# Patient Record
Sex: Male | Born: 1985 | Race: Black or African American | Hispanic: No | Marital: Single | Smoking: Former smoker
Health system: Southern US, Community
[De-identification: ages and names within clinical notes are randomized; demographics above are authoritative.]

## PROBLEM LIST (undated history)

## (undated) DIAGNOSIS — I471 Supraventricular tachycardia, unspecified: Secondary | ICD-10-CM

## (undated) DIAGNOSIS — N289 Disorder of kidney and ureter, unspecified: Secondary | ICD-10-CM

## (undated) DIAGNOSIS — G4733 Obstructive sleep apnea (adult) (pediatric): Secondary | ICD-10-CM

## (undated) DIAGNOSIS — Z720 Tobacco use: Secondary | ICD-10-CM

## (undated) DIAGNOSIS — I10 Essential (primary) hypertension: Secondary | ICD-10-CM

## (undated) DIAGNOSIS — B351 Tinea unguium: Secondary | ICD-10-CM

## (undated) DIAGNOSIS — E119 Type 2 diabetes mellitus without complications: Secondary | ICD-10-CM

## (undated) HISTORY — DX: Essential (primary) hypertension: I10

## (undated) HISTORY — DX: Morbid (severe) obesity due to excess calories: E66.01

## (undated) HISTORY — DX: Tobacco use: Z72.0

## (undated) HISTORY — DX: Obstructive sleep apnea (adult) (pediatric): G47.33

## (undated) HISTORY — DX: Supraventricular tachycardia, unspecified: I47.10

## (undated) HISTORY — DX: Type 2 diabetes mellitus without complications: E11.9

## (undated) HISTORY — DX: Tinea unguium: B35.1

## (undated) HISTORY — PX: APPENDECTOMY: SHX54

---

## 2019-05-20 ENCOUNTER — Ambulatory Visit: Payer: Medicaid Other | Admitting: Podiatry

## 2019-07-12 ENCOUNTER — Encounter: Payer: Self-pay | Admitting: Podiatry

## 2019-07-12 ENCOUNTER — Ambulatory Visit: Payer: Medicaid Other | Admitting: Podiatry

## 2019-07-12 ENCOUNTER — Other Ambulatory Visit: Payer: Self-pay

## 2019-07-12 DIAGNOSIS — E1159 Type 2 diabetes mellitus with other circulatory complications: Secondary | ICD-10-CM | POA: Diagnosis not present

## 2019-07-12 DIAGNOSIS — N182 Chronic kidney disease, stage 2 (mild): Secondary | ICD-10-CM | POA: Insufficient documentation

## 2019-07-12 DIAGNOSIS — E119 Type 2 diabetes mellitus without complications: Secondary | ICD-10-CM | POA: Insufficient documentation

## 2019-07-12 NOTE — Progress Notes (Signed)
This patient presents to the office with chief complaint of long thick nails and diabetic feet.  This patient  says there  is  no pain and discomfort in his  feet.  This patient says there are long thick painful nails. He presents to the office with caregiver. These nails are painful walking and wearing shoes.  Patient has no history of infection or drainage from both feet.  Patient is unable to  self treat his own nails . This patient presents  to the office today for treatment of the  long nails and a foot evaluation due to history of  diabetes.  General Appearance  Alert, conversant and in no acute stress.  Vascular  Dorsalis pedis and posterior tibial  pulses are not  palpable  bilaterally.  Capillary return is within normal limits  bilaterally. Temperature is within normal limits  Bilaterally.  Swelling ankles  B/L.  Neurologic  Senn-Weinstein monofilament wire test within normal limits  bilaterally. Muscle power within normal limits bilaterally.  Nails Thick disfigured discolored nails with subungual debris  from hallux to fifth toes bilaterally. No evidence of bacterial infection or drainage bilaterally.  Orthopedic  No limitations of motion of motion feet .  No crepitus or effusions noted.  No bony pathology or digital deformities noted.  Skin  normotropic skin with no porokeratosis noted bilaterally.  No signs of infections or ulcers noted.     Onychomycosis  Diabetes with vascular pathology.  IE  Debride nails x 10.  A diabetic foot exam was performed and there is no evidence of any vascular or neurologic pathology.   RTC 3 months.   Helane Gunther [name]

## 2019-11-09 ENCOUNTER — Encounter: Payer: Self-pay | Admitting: Podiatry

## 2019-11-09 ENCOUNTER — Ambulatory Visit: Payer: Medicaid Other | Admitting: Podiatry

## 2019-11-09 ENCOUNTER — Other Ambulatory Visit: Payer: Self-pay

## 2019-11-09 DIAGNOSIS — B351 Tinea unguium: Secondary | ICD-10-CM | POA: Diagnosis not present

## 2019-11-09 DIAGNOSIS — M79674 Pain in right toe(s): Secondary | ICD-10-CM

## 2019-11-09 DIAGNOSIS — E1159 Type 2 diabetes mellitus with other circulatory complications: Secondary | ICD-10-CM

## 2019-11-09 DIAGNOSIS — M79675 Pain in left toe(s): Secondary | ICD-10-CM | POA: Diagnosis not present

## 2019-11-09 NOTE — Progress Notes (Signed)
This patient returns to my office for at risk foot care.  This patient requires this care by a professional since this patient will be at risk due to having diabetes.   This patient is unable to cut nails himself since the patient cannot reach his nails.These nails are painful walking and wearing shoes.  This patient presents for at risk foot care today.  He is brought to the office by caregiver from  [place].  General Appearance  Alert, conversant and in no acute stress.  Vascular  Dorsalis pedis and posterior tibial  pulses are not  palpable  Bilaterally due to swelling..  Capillary return is within normal limits  bilaterally. Temperature is within normal limits  bilaterally.  Neurologic  Senn-Weinstein monofilament wire test within normal limits  bilaterally. Muscle power within normal limits bilaterally.  Nails Thick disfigured discolored nails with subungual debris  from hallux to fifth toes bilaterally. No evidence of bacterial infection or drainage bilaterally.  Orthopedic  No limitations of motion  feet .  No crepitus or effusions noted.  No bony pathology or digital deformities noted.  Skin  normotropic skin with no porokeratosis noted bilaterally.  No signs of infections or ulcers noted.     Onychomycosis  Pain in right toes  Pain in left toes  Consent was obtained for treatment procedures.   Mechanical debridement of nails 1-5  bilaterally performed with a nail nipper.  Filed with dremel without incident.    Return office visit   3 months                 Told patient to return for periodic foot care and evaluation due to potential at risk complications.   Roque Schill [name]  

## 2019-12-09 ENCOUNTER — Encounter: Payer: Self-pay | Admitting: *Deleted

## 2019-12-09 ENCOUNTER — Other Ambulatory Visit: Payer: Self-pay

## 2019-12-09 ENCOUNTER — Ambulatory Visit (INDEPENDENT_AMBULATORY_CARE_PROVIDER_SITE_OTHER): Payer: Medicaid Other | Admitting: Cardiology

## 2019-12-09 ENCOUNTER — Encounter: Payer: Self-pay | Admitting: Cardiology

## 2019-12-09 VITALS — BP 154/76 | HR 70 | Ht 72.0 in | Wt >= 6400 oz

## 2019-12-09 DIAGNOSIS — R0602 Shortness of breath: Secondary | ICD-10-CM | POA: Diagnosis not present

## 2019-12-09 DIAGNOSIS — I10 Essential (primary) hypertension: Secondary | ICD-10-CM | POA: Diagnosis not present

## 2019-12-09 DIAGNOSIS — R600 Localized edema: Secondary | ICD-10-CM

## 2019-12-09 MED ORDER — FUROSEMIDE 20 MG PO TABS
20.0000 mg | ORAL_TABLET | Freq: Every day | ORAL | 6 refills | Status: DC
Start: 2019-12-09 — End: 2019-12-20

## 2019-12-09 NOTE — Progress Notes (Signed)
Clinical Summary Melvin Ray is a 34 y.o.male seen as new consult, referred by Dr Dahlia Client for the following medical problems.   1. Leg edema - ongoing x 6 months, bilateral - some ongoign SOB/DOE which has been progressing - swelling has been improving with lasix x 1 month.  - high sodium intake.    2. OSA - sleeps on bipap  3. HTN - has not taken meds yet today.   3. Unclear history of afib - he reports he thinks he has a history of afib, diagnosed last year during an ER visit to [facility] - pcp notes lists in PMH but no details given - he is not on anticoagulation   Past Medical History:  Diagnosis Date  . Diabetes mellitus without complication (HCC)   . Hypertension   . Onychomycosis   . Tobacco abuse      No Known Allergies   Current Outpatient Medications  Medication Sig Dispense Refill  . albuterol (VENTOLIN HFA) 108 (90 Base) MCG/ACT inhaler Inhale into the lungs every 6 (six) hours as needed for wheezing or shortness of breath.    . diltiazem (CARTIA XT) 240 MG 24 hr capsule Take 240 mg by mouth daily.    . empagliflozin (JARDIANCE) 10 MG TABS tablet Take 10 mg by mouth daily.    . Fluticasone-Salmeterol (ADVAIR HFA IN) Inhale into the lungs.    Marland Kitchen labetalol (NORMODYNE) 300 MG tablet Take 300 mg by mouth 2 (two) times daily.    Marland Kitchen losartan (COZAAR) 100 MG tablet Take 100 mg by mouth daily.    Marland Kitchen POTASSIUM CHLORIDE ER PO Take by mouth.    . traZODone (DESYREL) 50 MG tablet Take 50 mg by mouth at bedtime.     No current facility-administered medications for this visit.     No past surgical history on file.   No Known Allergies    No family history on file.   Social History Melvin Ray reports that he has quit smoking. He has quit using smokeless tobacco. Melvin Ray reports no history of alcohol use.   Review of Systems CONSTITUTIONAL: No weight loss, fever, chills, weakness or fatigue.  HEENT: Eyes: No visual loss, blurred vision, double vision or  yellow sclerae.No hearing loss, sneezing, congestion, runny nose or sore throat.  SKIN: No rash or itching.  CARDIOVASCULAR: per hpi RESPIRATORY: per hpi GASTROINTESTINAL: No anorexia, nausea, vomiting or diarrhea. No abdominal pain or blood.  GENITOURINARY: No burning on urination, no polyuria NEUROLOGICAL: No headache, dizziness, syncope, paralysis, ataxia, numbness or tingling in the extremities. No change in bowel or bladder control.  MUSCULOSKELETAL: No muscle, back pain, joint pain or stiffness.  LYMPHATICS: No enlarged nodes. No history of splenectomy.  PSYCHIATRIC: No history of depression or anxiety.  ENDOCRINOLOGIC: No reports of sweating, cold or heat intolerance. No polyuria or polydipsia.  Marland Kitchen   Physical Examination Today's Vitals   12/09/19 0906  BP: (!) 154/76  Pulse: 70  SpO2: 97%  Weight: (!) 440 lb 12.8 oz (199.9 kg)  Height: 6' (1.829 m)   Body mass index is 59.78 kg/m.  Gen: resting comfortably, no acute distress HEENT: no scleral icterus, pupils equal round and reactive, no palptable cervical adenopathy,  CV: RRR, no m/r/g no jvd Resp: Clear to auscultation bilaterally GI: abdomen is soft, non-tender, non-distended, normal bowel sounds, no hepatosplenomegaly MSK: extremities are warm, no edema.  Skin: warm, no rash Neuro:  no focal deficits Psych: appropriate affect  Assessment and Plan  1. Leg edema/SOB - unclear etiology, with associated SOB/DOE we will obtain an echo - continue lasix 20mg  daily, hard to get a sense of his volume status by exam given body habitus  2. 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 Would send out a renin/aldo level prior to starting aldactone - continue loop diuretic with his bp regimen  3. Unclear history of afib - obtain records from [facility]. If confirmed his CHADS2Vasc would be at least 2 (HTN,  DM2) and would require anticoag        [facility], M.D.

## 2019-12-09 NOTE — Patient Instructions (Addendum)
Medication Instructions:   Lasix refilled today.  Continue all other current medications.  Labwork: none  Testing/Procedures:  Your physician has requested that you have an echocardiogram. Echocardiography is a painless test that uses sound waves to create images of your heart. It provides your doctor with information about the size and shape of your heart and how well your heart's chambers and valves are working. This procedure takes approximately one hour. There are no restrictions for this procedure.  Office will contact with results via phone or letter.    Follow-Up: 1 month   Any Other Special Instructions Will Be Listed Below (If Applicable). Office will try to assist with getting free blood pressure cuff.    If you need a refill on your cardiac medications before your next appointment, please call your pharmacy.

## 2019-12-12 ENCOUNTER — Telehealth: Payer: Self-pay | Admitting: Licensed Clinical Social Worker

## 2019-12-12 NOTE — Telephone Encounter (Signed)
CSW referred to assist patient with obtaining a BP cuff. CSW contacted patient to inform cuff will be delivered to home. Patient grateful for support and assistance. CSW available as needed. Jackie Duval Macleod, LCSW, CCSW-MCS [phone]  

## 2019-12-20 ENCOUNTER — Other Ambulatory Visit: Payer: Self-pay | Admitting: *Deleted

## 2019-12-20 ENCOUNTER — Ambulatory Visit (HOSPITAL_COMMUNITY): Admission: RE | Admit: 2019-12-20 | Payer: Medicaid Other | Source: Ambulatory Visit

## 2019-12-20 MED ORDER — FUROSEMIDE 20 MG PO TABS
20.0000 mg | ORAL_TABLET | Freq: Every day | ORAL | 6 refills | Status: DC
Start: 2019-12-20 — End: 2020-03-21

## 2019-12-28 ENCOUNTER — Other Ambulatory Visit: Payer: Self-pay

## 2019-12-28 ENCOUNTER — Ambulatory Visit (HOSPITAL_COMMUNITY)
Admission: RE | Admit: 2019-12-28 | Discharge: 2019-12-28 | Disposition: A | Discharge status: Home / Self Care | Payer: Medicaid Other | Source: Ambulatory Visit | Attending: Cardiology | Admitting: Cardiology

## 2019-12-28 DIAGNOSIS — R0602 Shortness of breath: Secondary | ICD-10-CM | POA: Insufficient documentation

## 2019-12-28 LAB — ECHOCARDIOGRAM COMPLETE
Area-P 1/2: 2.94 cm2
S' Lateral: 4.06 cm

## 2019-12-28 NOTE — Progress Notes (Signed)
*  PRELIMINARY RESULTS* Echocardiogram 2D Echocardiogram has been performed.  Stacey Drain 12/28/2019, 2:33 PM

## 2020-01-10 ENCOUNTER — Telehealth: Payer: Self-pay | Admitting: *Deleted

## 2020-01-10 ENCOUNTER — Ambulatory Visit: Payer: Medicaid Other | Admitting: Family Medicine

## 2020-01-10 NOTE — Telephone Encounter (Signed)
Pt aware.

## 2020-01-10 NOTE — Telephone Encounter (Signed)
-----   Message from Antoine Poche, MD sent at 01/09/2020  8:49 AM EST ----- Overall echo looks good, normal heart pumping function   Dominga Ferry MD

## 2020-01-12 NOTE — Progress Notes (Addendum)
Cardiology Office Note  Date: 01/13/2020   ID: Melvin Ray, DOB [birth year], MRN [id number]  PCP:  Waldon Reining, MD  Cardiologist:  Dina Rich, MD Electrophysiologist:  None   Chief Complaint: Follow-up shortness of breath, hypertension, leg edema  History of Present Illness: Melvin Ray is a 34 y.o. male with a history of shortness of breath, HTN, DM2, tobacco abuse, leg edema, morbid obesity with BMI of 59.7  Last encounter with Dr. Wyline Mood 12/09/2019.  Was experiencing leg edema ongoing for 6 months bilaterally.  Also ongoing SOB/DOE progressing.  Lower extremity edema had been improving with Lasix x1 month.  Had been consuming high amounts of sodium.  OSA on CPAP.  Dr. Wyline Mood stated over time would DC Norvasc since on diltiazem.  Obtain labs from PCP.  Pending results we will likely start Aldactone in setting of resistant hypertension.  Would send out renin/aldosterone level prior to starting Aldactone.  Continue loop diuretic with blood pressure regimen.  History of atrial fibrillation was unclear.  Obtain records from [facility] to confirm his CHA2DS2-VASc score would be at least 2 (hypertension, DM2) and would require anticoagulation.   Patient is here for 1 month follow-up.  States in the interim his primary care provider started him on clonidine 0.1 mg p.o. twice daily which he just started yesterday.  Blood pressure is elevated on arrival at 180/102.  Patient states he has not taking any of his antihypertensive medications yet today.  He states he usually sleeps until 12 noon then gets up and takes his medications.  He denies any anginal symptoms.  Continues with DOE.  Has significant morbid obesity with BMI of 61.  History of sleep apnea on BiPAP at night.  Smokes cigars.  Dr. Wyline Mood had requested lab work from PCP and records from [facility] neither of which have been received at this point.  Patient denies any palpitations or arrhythmias, orthostatic symptoms, CVA or  TIA-like symptoms, PND, orthopnea.  Denies any claudication-like symptoms, DVT or PE-like symptoms.  He has chronic lower extremity edema.  States his blood sugars have been running in the 130s to 150s.  EKG today shows normal sinus rhythm with a rate of 67.  Minimal voltage criteria for LVH.  Nonspecific ST and to evaluate of abnormality.   Past Medical History:  Diagnosis Date  . Diabetes mellitus without complication (HCC)   . Hypertension   . Onychomycosis   . Tobacco abuse     No past surgical history on file.  Current Outpatient Medications  Medication Sig Dispense Refill  . ACCU-CHEK GUIDE test strip 3 (three) times daily.    . Accu-Chek Softclix Lancets lancets 3 (three) times daily.    Marland Kitchen albuterol (VENTOLIN HFA) 108 (90 Base) MCG/ACT inhaler Inhale into the lungs every 6 (six) hours as needed for wheezing or shortness of breath.    Marland Kitchen amLODipine (NORVASC) 10 MG tablet Take 10 mg by mouth daily.    . cloNIDine (CATAPRES) 0.1 MG tablet Take 0.1 mg by mouth 2 (two) times daily.    Marland Kitchen diltiazem (CARTIA XT) 240 MG 24 hr capsule Take 240 mg by mouth daily.    . empagliflozin (JARDIANCE) 10 MG TABS tablet Take 10 mg by mouth daily.    Marland Kitchen FLUoxetine (PROZAC) 20 MG tablet Take 20 mg by mouth daily.    . Fluticasone-Salmeterol (ADVAIR HFA IN) Inhale into the lungs.    . furosemide (LASIX) 20 MG tablet Take 1 tablet (20 mg total) by mouth  daily. 30 tablet 6  . ibuprofen (ADVIL) 400 MG tablet Take 400 mg by mouth 3 (three) times daily.    Marland Kitchen LANTUS SOLOSTAR 100 UNIT/ML Solostar Pen Inject 20 Units into the skin at bedtime.    Marland Kitchen losartan (COZAAR) 100 MG tablet Take 100 mg by mouth daily.    . metoprolol (TOPROL-XL) 200 MG 24 hr tablet Take 200 mg by mouth daily.    . pantoprazole (PROTONIX) 40 MG tablet Take 40 mg by mouth daily.    . potassium chloride (KLOR-CON) 10 MEQ tablet Take 10 mEq by mouth daily.    . traZODone (DESYREL) 100 MG tablet Take 200 mg by mouth at bedtime.      No current  facility-administered medications for this visit.   Allergies:  Patient has no known allergies.   Social History: The patient  reports that he has been smoking cigars. He has quit using smokeless tobacco. He reports that he does not drink alcohol and does not use drugs.   Family History: The patient's family history is not on file.   ROS:  Please see the history of present illness. Otherwise, complete review of systems is positive for none.  All other systems are reviewed and negative.   Physical Exam: VS:  BP (!) 180/102 Comment: hasn't taken any meds yet today.  Pulse 63   Ht 6' (1.829 m)   Wt (!) 449 lb 12.8 oz (204 kg)   SpO2 94%   BMI 61.00 kg/m , BMI Body mass index is 61 kg/m.  Wt Readings from Last 3 Encounters:  01/13/20 (!) 449 lb 12.8 oz (204 kg)  12/09/19 (!) 440 lb 12.8 oz (199.9 kg)    General: Severely morbid obese patient appears comfortable at rest. Neck: Supple, no elevated JVP or carotid bruits, no thyromegaly. Lungs: Clear to auscultation, nonlabored breathing at rest. Cardiac: Regular rate and rhythm, no S3 or significant systolic murmur, no pericardial rub. Extremities: Mild non-pitting edema, distal pulses 2+. Skin: Warm and dry. Musculoskeletal: No kyphosis. Neuropsychiatric: Alert and oriented x3, affect grossly appropriate.  ECG:  An ECG dated 01/13/2020 was personally reviewed today and demonstrated:  Normal sinus rhythm rate of 67, minimal voltage criteria for LVH.  Nonspecific ST and T wave abnormality.  Recent Labwork: No results found for requested labs within last 8760 hours.  No results found for: CHOL, TRIG, HDL, CHOLHDL, VLDL, LDLCALC, LDLDIRECT  Other Studies Reviewed Today:  Echocardiogram 12/28/2019  1. Left ventricular ejection fraction, by estimation, is 60 to 65%. The left ventricle has normal function. The left ventricle has no regional wall motion abnormalities. The left ventricular internal cavity size was mildly dilated. There  is mild left ventricular hypertrophy. Left ventricular diastolic parameters are indeterminate. 2. Right ventricular systolic function is normal. The right ventricular size is normal. Tricuspid regurgitation signal is inadequate for assessing PA pressure. 3. The mitral valve is grossly normal. Trivial mitral valve regurgitation. 4. The aortic valve is tricuspid. Aortic valve regurgitation is not visualized. 5. The inferior vena cava is dilated in size with >50% respiratory variability, suggesting right atrial pressure of 8 mmHg.   Assessment and Plan:  1. SOB (shortness of breath)   2. Lower extremity edema   3. Essential hypertension    1. SOB (shortness of breath) Continues with some mild DOE recent echocardiogram showed normal EF of 60 to 65%.  Mildly dilated LV internal cavity size.  Mild LVH trivial MR.  Shortness of breath likely multifactorial due to morbid obesity  hypoventilation syndrome..  Deconditioning, OSA.  2. Lower extremity edema Continues with lower extremity edema which appears to be chronic.  Continue Lasix 20 mg p.o. daily.  3. Essential hypertension Blood pressure continues to be elevated BP today 180/102.  However patient states he has not taken his antihypertensive medication today.  When asked why he has not taken it he states he usually does not get up until noon at which time he usually takes the medication.  We have not received lab work from PCP nor [facility] records as requested at last visit.  We are going to stop his amlodipine.  We will replace with hydralazine 25 mg p.o. 3 times daily.  Continue Cartia XT 240 mg daily.  Continue Lasix 20 mg daily.  Continue losartan 100 mg p.o. daily.  Continue Toprol XL 200 mg p.o. daily.  Please get a basic metabolic panel with magnesium.  Also get renal/aldosterone levels.  Possible plan to start Aldactone pending lab results.  States his PCP started him on clonidine 0.1 mg p.o. 3 times daily.  States he started this medication  yesterday.   Medication Adjustments/Labs and Tests Ordered: Current medicines are reviewed at length with the patient today.  Concerns regarding medicines are outlined above.   Disposition: Follow-up with Dr. Wyline Mood or APP 1 month.  Signed, Rennis Harding, NP 01/13/2020 2:51 PM    [facility] at [facility] [address] [place], [place], [place] [address] Phone: [phone]; Fax: [phone]

## 2020-01-13 ENCOUNTER — Encounter: Payer: Self-pay | Admitting: Family Medicine

## 2020-01-13 ENCOUNTER — Ambulatory Visit (INDEPENDENT_AMBULATORY_CARE_PROVIDER_SITE_OTHER): Payer: Medicaid Other | Admitting: Family Medicine

## 2020-01-13 VITALS — BP 180/102 | HR 63 | Ht 72.0 in | Wt >= 6400 oz

## 2020-01-13 DIAGNOSIS — R0602 Shortness of breath: Secondary | ICD-10-CM | POA: Diagnosis not present

## 2020-01-13 DIAGNOSIS — R600 Localized edema: Secondary | ICD-10-CM | POA: Diagnosis not present

## 2020-01-13 DIAGNOSIS — I10 Essential (primary) hypertension: Secondary | ICD-10-CM

## 2020-01-13 MED ORDER — HYDRALAZINE HCL 25 MG PO TABS: 25.0000 mg | ORAL_TABLET | Freq: Three times a day (TID) | ORAL | 3 refills | Status: DC

## 2020-01-13 NOTE — Patient Instructions (Addendum)
Medication Instructions:   Stop Amlodipine.   Start Hydralazine 25mg  three times per day.  Continue all other medications.    Labwork:  BMET, Magnesium, Renin, Aldosterone - orders given today.   Office will contact with results via phone or letter.    Testing/Procedures: none  Follow-Up: 1 month   Any Other Special Instructions Will Be Listed Below (If Applicable).  If you need a refill on your cardiac medications before your next appointment, please call your pharmacy.

## 2020-01-27 ENCOUNTER — Ambulatory Visit: Payer: Medicaid Other | Admitting: Family Medicine

## 2020-02-06 NOTE — Progress Notes (Signed)
Cardiology Office Note  Date: 02/07/2020   ID: Melvin Ray, DOB [birth year], MRN [id number]  PCP:  Waldon Reining, MD  Cardiologist:  Dina Rich, MD Electrophysiologist:  None   Chief Complaint: Follow-up shortness of breath, hypertension, leg edema  History of Present Illness: Melvin Ray is a 34 y.o. male with a history of shortness of breath, HTN, DM2, tobacco abuse, leg edema, morbid obesity with BMI of 59.7  Last encounter with Dr. Wyline Mood 12/09/2019.  Was experiencing leg edema ongoing for 6 months bilaterally.  Also ongoing SOB/DOE progressing.  Lower extremity edema had been improving with Lasix x1 month.  Had been consuming high amounts of sodium.  OSA on CPAP.  Dr. Wyline Mood stated over time would DC Norvasc since on diltiazem.  Obtain labs from PCP.  Pending results we will likely start Aldactone in setting of resistant hypertension.  Would send out renin/aldosterone level prior to starting Aldactone.  Continue loop diuretic with blood pressure regimen.  History of atrial fibrillation was unclear.  Obtain records from [facility] to confirm his CHA2DS2-VASc score would be at least 2 (hypertension, DM2) and would require anticoagulation.   Last visit for 1 month follow-up.  Stated in the interim his primary care provider started him on clonidine 0.1 mg p.o. twice daily which he had just started  Blood pressure was elevated on arrival at 180/102.    He denies any anginal symptoms.  Continues with DOE.  Has significant morbid obesity with BMI of 61.  History of sleep apnea on BiPAP at night.  Smokes cigars.  Dr. Wyline Mood had requested lab work from PCP and records from [facility] neither of which had been received at this point.  Patient denied any palpitations or arrhythmias, orthostatic symptoms, CVA or TIA-like symptoms, PND, orthopnea.  Denied any claudication-like symptoms, DVT or PE-like symptoms.  Having chronic lower extremity edema.  Stated his blood sugars had been running  in the 130s to 150s.    He is here for 1 month follow-up.  He presents with continued elevated blood pressure 174/120.  He states he woke up at 6 AM this morning and took 2 of his blood pressure medicines but did not take the others stating he fell back asleep.  He has not taken any of his other antihypertensive medications today.  There appears to be some issues with noncompliance.  He states he is wearing his CPAP device but it is not working appropriately and had been previously referred for sleep study.  States he may have missed the call to have his sleep study performed.  He does not remember.  Severe morbid obesity with a BMI of 61.  Current weight 452 pounds.  Patient is not very active on a daily basis.  States he basically stays inside and watches TV.  States he tends to seclude himself due to his depression does not have a lot of human interaction per his statement.  States he has been consuming large amounts of diet drinks during the day.  He had been ordered lab work at last visit but never had it done.  States he did did not have a ride.  Past Medical History:  Diagnosis Date  . Diabetes mellitus without complication (HCC)   . Hypertension   . Morbid obesity (HCC)   . Obstructive sleep apnea   . Onychomycosis   . Tobacco abuse     No past surgical history on file.  Current Outpatient Medications  Medication Sig Dispense Refill  .  ACCU-CHEK GUIDE test strip 3 (three) times daily.    . Accu-Chek Softclix Lancets lancets 3 (three) times daily.    Marland Kitchen albuterol (VENTOLIN HFA) 108 (90 Base) MCG/ACT inhaler Inhale into the lungs every 6 (six) hours as needed for wheezing or shortness of breath.    . cloNIDine (CATAPRES) 0.1 MG tablet Take 0.1 mg by mouth 2 (two) times daily.    Marland Kitchen diltiazem (CARDIZEM CD) 240 MG 24 hr capsule Take 240 mg by mouth daily.    . empagliflozin (JARDIANCE) 10 MG TABS tablet Take 10 mg by mouth daily.    Marland Kitchen FLUoxetine (PROZAC) 20 MG tablet Take 20 mg by mouth  daily.    . Fluticasone-Salmeterol (ADVAIR HFA IN) Inhale into the lungs.    . furosemide (LASIX) 20 MG tablet Take 1 tablet (20 mg total) by mouth daily. 30 tablet 6  . hydrALAZINE (APRESOLINE) 25 MG tablet Take 1 tablet (25 mg total) by mouth 3 (three) times daily. 270 tablet 3  . ibuprofen (ADVIL) 400 MG tablet Take 400 mg by mouth 3 (three) times daily.    Marland Kitchen LANTUS SOLOSTAR 100 UNIT/ML Solostar Pen Inject 20 Units into the skin at bedtime.    Marland Kitchen losartan (COZAAR) 100 MG tablet Take 100 mg by mouth daily.    . metoprolol (TOPROL-XL) 200 MG 24 hr tablet Take 200 mg by mouth daily.    . pantoprazole (PROTONIX) 40 MG tablet Take 40 mg by mouth daily.    . potassium chloride (KLOR-CON) 10 MEQ tablet Take 10 mEq by mouth daily.    . traZODone (DESYREL) 100 MG tablet Take 200 mg by mouth at bedtime.      No current facility-administered medications for this visit.   Allergies:  Patient has no known allergies.   Social History: The patient  reports that he has been smoking cigars. He has quit using smokeless tobacco. He reports that he does not drink alcohol and does not use drugs.   Family History: The patient's family history includes Diabetes in his maternal grandmother; Heart failure in his father.   ROS:  Please see the history of present illness. Otherwise, complete review of systems is positive for none.  All other systems are reviewed and negative.   Physical Exam: VS:  BP (!) 174/120   Pulse 71   Ht 6' (1.829 m)   Wt (!) 452 lb (205 kg)   SpO2 95%   BMI 61.30 kg/m , BMI Body mass index is 61.3 kg/m.  Wt Readings from Last 3 Encounters:  02/07/20 (!) 452 lb (205 kg)  01/13/20 (!) 449 lb 12.8 oz (204 kg)  12/09/19 (!) 440 lb 12.8 oz (199.9 kg)    General: Severely morbid obese patient appears comfortable at rest. Neck: Supple, no elevated JVP or carotid bruits, no thyromegaly. Lungs: Clear to auscultation, nonlabored breathing at rest. Cardiac: Regular rate and rhythm, no S3  or significant systolic murmur, no pericardial rub. Extremities: Mild non-pitting edema, distal pulses 2+. Skin: Warm and dry. Musculoskeletal: No kyphosis. Neuropsychiatric: Alert and oriented x3, affect grossly appropriate.  ECG:  An ECG dated 01/13/2020 was personally reviewed today and demonstrated:  Normal sinus rhythm rate of 67, minimal voltage criteria for LVH.  Nonspecific ST and T wave abnormality.  Recent Labwork: No results found for requested labs within last 8760 hours.  No results found for: CHOL, TRIG, HDL, CHOLHDL, VLDL, LDLCALC, LDLDIRECT  Other Studies Reviewed Today:  Echocardiogram 12/28/2019  1. Left ventricular ejection fraction, by  estimation, is 60 to 65%. The left ventricle has normal function. The left ventricle has no regional wall motion abnormalities. The left ventricular internal cavity size was mildly dilated. There is mild left ventricular hypertrophy. Left ventricular diastolic parameters are indeterminate. 2. Right ventricular systolic function is normal. The right ventricular size is normal. Tricuspid regurgitation signal is inadequate for assessing PA pressure. 3. The mitral valve is grossly normal. Trivial mitral valve regurgitation. 4. The aortic valve is tricuspid. Aortic valve regurgitation is not visualized. 5. The inferior vena cava is dilated in size with >50% respiratory variability, suggesting right atrial pressure of 8 mmHg.   Assessment and Plan:   1. SOB (shortness of breath) Continues with some mild DOE recent echocardiogram showed normal EF of 60 to 65%.  Mildly dilated LV internal cavity size.  Mild LVH trivial MR.  Shortness of breath likely multifactorial due to morbid obesity hypoventilation syndrome..  Deconditioning, OSA.  2. Lower extremity edema Continues with lower extremity edema which appears to be chronic.  Continue Lasix 20 mg p.o. daily.  3. Essential hypertension  Blood pressure remains elevated today.  Patient  states he woke up at 6 AM this morning and only took 2 of his antihypertensive medications.  He has not taken the others yet at this time.  Given continued high blood pressures and apparent noncompliance we are referring him to hypertension clinic for management.  Continue clonidine 0.1 mg p.o. twice daily, Lasix 20 mg p.o. daily.  Hydralazine 25 mg p.o. 3 times daily.  Losartan 100 mg p.o. daily.  Continue Toprol-XL 200 mg daily.  Advised him to take all the medication he has not taken yet today for his hypertension.  Emphasized the importance of taking all medications as prescribed.  4.  Sleep apnea Patient states he was previously referred for sleep referral but was never contacted.  He states he may have missed the phone call.  We will refer to pulmonology for sleep study.  5.  Morbid obesity Severe morbid obesity with a BMI of 61.3.  Advised patient he needs to make some major lifestyle changes in the form of dietary and lifestyle modifications.  He states he drinks a fairly large amount of diet sodas during the day.  He is not very active.  He admits to being very reclusive and stays in his home which he attributes to his depression.  He states he mostly watches TV.  Medication Adjustments/Labs and Tests Ordered: Current medicines are reviewed at length with the patient today.  Concerns regarding medicines are outlined above.   Disposition: Follow-up with Dr. Wyline Mood or APP 84-month  Signed, Rennis Harding, NP 02/07/2020 2:42 PM    [facility] at [facility] [address] [place], [place], [place] [address] Phone: [phone]; Fax: [phone]

## 2020-02-07 ENCOUNTER — Encounter: Payer: Self-pay | Admitting: *Deleted

## 2020-02-07 ENCOUNTER — Ambulatory Visit (INDEPENDENT_AMBULATORY_CARE_PROVIDER_SITE_OTHER): Payer: Medicaid Other | Admitting: Family Medicine

## 2020-02-07 ENCOUNTER — Other Ambulatory Visit: Payer: Self-pay

## 2020-02-07 ENCOUNTER — Encounter: Payer: Self-pay | Admitting: Family Medicine

## 2020-02-07 DIAGNOSIS — I1A0 Resistant hypertension: Secondary | ICD-10-CM

## 2020-02-07 DIAGNOSIS — G4733 Obstructive sleep apnea (adult) (pediatric): Secondary | ICD-10-CM | POA: Diagnosis not present

## 2020-02-07 DIAGNOSIS — I10 Essential (primary) hypertension: Secondary | ICD-10-CM

## 2020-02-07 DIAGNOSIS — R600 Localized edema: Secondary | ICD-10-CM | POA: Insufficient documentation

## 2020-02-07 HISTORY — DX: Resistant hypertension: I1A.0

## 2020-02-07 NOTE — Patient Instructions (Signed)
Your physician recommends that you schedule a follow-up appointment in: 2 MONTHS WITH Nena Polio, NP  Your physician recommends that you continue on your current medications as directed. Please refer to the Current Medication list given to you today.  You have been referred to HYPERTENSION CLINIC AND PULMONOLOGIST   Thank you for choosing [facility]!!

## 2020-02-08 ENCOUNTER — Ambulatory Visit: Payer: Medicaid Other | Admitting: Podiatry

## 2020-03-21 ENCOUNTER — Encounter: Payer: Self-pay | Admitting: Cardiovascular Disease

## 2020-03-21 ENCOUNTER — Other Ambulatory Visit: Payer: Self-pay

## 2020-03-21 ENCOUNTER — Ambulatory Visit (INDEPENDENT_AMBULATORY_CARE_PROVIDER_SITE_OTHER): Payer: Medicaid Other | Admitting: Cardiovascular Disease

## 2020-03-21 VITALS — BP 166/102 | HR 75 | Ht 72.0 in | Wt >= 6400 oz

## 2020-03-21 DIAGNOSIS — R0683 Snoring: Secondary | ICD-10-CM

## 2020-03-21 DIAGNOSIS — Z5181 Encounter for therapeutic drug level monitoring: Secondary | ICD-10-CM | POA: Diagnosis not present

## 2020-03-21 DIAGNOSIS — I10 Essential (primary) hypertension: Secondary | ICD-10-CM | POA: Diagnosis not present

## 2020-03-21 DIAGNOSIS — G4733 Obstructive sleep apnea (adult) (pediatric): Secondary | ICD-10-CM | POA: Diagnosis not present

## 2020-03-21 MED ORDER — HYDROCHLOROTHIAZIDE 25 MG PO TABS: 25.0000 mg | ORAL_TABLET | Freq: Every day | ORAL | 1 refills | Status: DC

## 2020-03-21 NOTE — Progress Notes (Signed)
Hypertension Clinic Initial Assessment:    Date:  03/26/2020   ID:  Melvin Ray, DOB 1985/11/20, MRN [id number]  PCP:  Waldon Reining, MD  Cardiologist:  Dina Rich, MD  Nephrologist:  Referring MD: Netta Neat., NP   CC: Hypertension  History of Present Illness:    [name] is a 35 y.o. male with a hx of OSA on CPAP, tobacco abuse and morbid obesity who here to establish care in the advanced hypertension clinic. He was first diagnosed with hypertension in his late teens.  He doesn't recall having a work up for the cause of his early onset hypertesion.  He was previously working with Dr. Wyline Mood due to lower extremity shortness of breath.  He was started on furosemide and instructed to reduce his sodium intake.  After seeing Dr. Wyline Mood he was started on clonidine by his PCP.  At home his BP has been as high as 180 systolic.  On average it is 130-160s.  He is good about taking his medication and rarely forgets.  He smokes a couple cigars per week.  He previously had edema but it has improved since he started on Lasix.  He does struggle with shortness of breath.  He denies orthopnea or PND.  He has a CPAP machine that he uses but it is not functioning well and he needs a new one.  He drinks several Pepsi's daily.  He does not use any over-the-counter supplements or herbs.  He does not use any NSAIDs.  He has a Web designer who helps check on him and she notes that he is compliant with his meds.  He does think that having a pillbox would be helpful.  He struggles with transportation.  He never learned to drive but would be able to get a car if he could get into a driving program.  His diet has been poor.  He knows that he has too much sodium in his diet but is not able to cook for himself.  Lately he has not been taking his Lasix when he has to go out because of frequent urination.  He doesn't get any exercise and has been gaining weight.    Previous  antihypertensives: Lisinopril labetalol Amlodipine   Past Medical History:  Diagnosis Date  . Diabetes mellitus without complication (HCC)   . Hypertension   . Morbid obesity (HCC)   . Obstructive sleep apnea   . Onychomycosis   . Tobacco abuse     History reviewed. No pertinent surgical history.  Current Medications: Current Meds  Medication Sig  . ACCU-CHEK GUIDE test strip 3 (three) times daily.  . Accu-Chek Softclix Lancets lancets 3 (three) times daily.  Marland Kitchen albuterol (VENTOLIN HFA) 108 (90 Base) MCG/ACT inhaler Inhale into the lungs every 6 (six) hours as needed for wheezing or shortness of breath.  . cloNIDine (CATAPRES) 0.1 MG tablet Take 0.1 mg by mouth 2 (two) times daily.  Marland Kitchen diltiazem (CARDIZEM CD) 240 MG 24 hr capsule Take 240 mg by mouth daily.  . empagliflozin (JARDIANCE) 10 MG TABS tablet Take 10 mg by mouth daily.  Marland Kitchen FLUoxetine (PROZAC) 20 MG tablet Take 20 mg by mouth daily.  . Fluticasone-Salmeterol (ADVAIR HFA IN) Inhale into the lungs.  . furosemide (LASIX) 20 MG tablet Take 20 mg by mouth as needed (swelling).  . hydrALAZINE (APRESOLINE) 25 MG tablet Take 1 tablet (25 mg total) by mouth 3 (three) times daily.  . hydrochlorothiazide (HYDRODIURIL) 25 MG tablet Take  1 tablet (25 mg total) by mouth daily.  Marland Kitchen ibuprofen (ADVIL) 400 MG tablet Take 400 mg by mouth 3 (three) times daily.  Marland Kitchen LANTUS SOLOSTAR 100 UNIT/ML Solostar Pen Inject 20 Units into the skin at bedtime.  Marland Kitchen losartan (COZAAR) 100 MG tablet Take 100 mg by mouth daily.  . metoprolol (TOPROL-XL) 200 MG 24 hr tablet Take 200 mg by mouth daily.  . pantoprazole (PROTONIX) 40 MG tablet Take 40 mg by mouth daily.  . potassium chloride (KLOR-CON) 10 MEQ tablet Take 10 mEq by mouth daily.  . traZODone (DESYREL) 100 MG tablet Take 200 mg by mouth at bedtime.   . [DISCONTINUED] furosemide (LASIX) 20 MG tablet Take 1 tablet (20 mg total) by mouth daily. (Patient taking differently: Take 20 mg by mouth as needed  for edema.)     Allergies:   Patient has no known allergies.   Social History   Socioeconomic History  . Marital status: Single    Spouse name: Not on file  . Number of children: Not on file  . Years of education: Not on file  . Highest education level: Not on file  Occupational History  . Not on file  Tobacco Use  . Smoking status: Current Some Day Smoker    Types: Cigars  . Smokeless tobacco: Former Engineer, water and Sexual Activity  . Alcohol use: Never  . Drug use: Never  . Sexual activity: Not on file  Other Topics Concern  . Not on file  Social History Narrative  . Not on file   Social Determinants of Health   Financial Resource Strain: Low Risk   . Difficulty of Paying Living Expenses: Not very hard  Food Insecurity: No Food Insecurity  . Worried About Programme researcher, broadcasting/film/video in the Last Year: Never true  . Ran Out of Food in the Last Year: Never true  Transportation Needs: Unmet Transportation Needs  . Lack of Transportation (Medical): Yes  . Lack of Transportation (Non-Medical): Yes  Physical Activity: Not on file  Stress: Not on file  Social Connections: Not on file     Family History: The patient's family history includes Diabetes in his maternal grandmother; Heart failure in his father and paternal grandmother.  ROS:   Please see the history of present illness.    All other systems reviewed and are negative.  EKGs/Labs/Other Studies Reviewed:    EKG:  EKG is not ordered today.  The ekg ordered 01/13/20 demonstrates sinus rhythm.  Rate 75 bpm.  LVH  Recent Labs: No results found for requested labs within last 8760 hours.   Recent Lipid Panel No results found for: CHOL, TRIG, HDL, CHOLHDL, VLDL, LDLCALC, LDLDIRECT  Physical Exam:    VS:  BP (!) 166/102 (BP Location: Left Arm)   Pulse 75   Ht 6' (1.829 m)   Wt (!) 456 lb (206.8 kg)   SpO2 92%   BMI 61.84 kg/m  , BMI Body mass index is 61.84 kg/m. GENERAL:  Well appearing.  No acute  distress HEENT: Pupils equal round and reactive, fundi not visualized, oral mucosa unremarkable NECK:  No jugular venous distention, waveform within normal limits, carotid upstroke brisk and symmetric, no bruits LUNGS:  Clear to auscultation bilaterally HEART:  RRR.  PMI not displaced or sustained,S1 and S2 within normal limits, no S3, no S4, no clicks, no rubs, no murmurs ABD:  Flat, positive bowel sounds normal in frequency in pitch, no bruits, no rebound, no guarding, no midline  pulsatile mass, no hepatomegaly, no splenomegaly EXT:  2 plus pulses throughout, no edema, no cyanosis no clubbing SKIN:  No rashes no nodules NEURO:  Cranial nerves II through XII grossly intact, motor grossly intact throughout PSYCH:  Cognitively intact, oriented to person place and time   ASSESSMENT:    1. Obstructive sleep apnea   2. Essential hypertension   3. Snoring   4. Therapeutic drug monitoring     PLAN:    # Essential hypertension:  # Morbid obesity: Mr. Desire BP is poorly controlled.  He has not been taking Lasix regularly because it makes him go to the bathroom too much.  Hydrochlorothiazide would likely do a better job controlling his pressure.  He will start HCTZ 25 mg daily and check a basic metabolic panel in a week.  He struggles with super morbid obesity which is likely the underlying cause of his hypertension.  He would like to exercise more but cannot get to a gym.  He needs to find a driving class so that he can get his driver's license and get a car.  He is unable to participate in the PRP program to the [facility] because he does not live in this area.  He did get and educational booklet with information on following the DASH diet and limiting sodium intake.  His ability to limit salt is limited by his inability to cook.  He thinks that getting a car would help him to be able to procure more healthy food options.  His community health worker is also going to inquire about whether he can get on  the list for Meals on Wheels.  We did discuss try to increase his physical activity to at least 150 minutes weekly.  Continue clonidine, diltiazem, hydralazine, losartan, and metoprolol.  In the future, consider switching metoprolol to a more effective beta-blocker such as carvedilol.  Hydralazine can also be titrated.  Given his issues with swelling we will not start amlodipine for now but this could be considered in the future instead of or in addition to the diltiazem.  # OSA:  His machine is not working properly.  It has been several years since his last the study so we will set him up with a new one.   Disposition:    FU with MD/PharmD in 1 month    Medication Adjustments/Labs and Tests Ordered: Current medicines are reviewed at length with the patient today.  Concerns regarding medicines are outlined above.  Orders Placed This Encounter  Procedures  . Basic metabolic panel  . Split night study   Meds ordered this encounter  Medications  . hydrochlorothiazide (HYDRODIURIL) 25 MG tablet    Sig: Take 1 tablet (25 mg total) by mouth daily.    Dispense:  90 tablet    Refill:  1    Aware has lasix as needed     Signed, Chilton Si, MD  03/26/2020 2:42 PM    [facility]

## 2020-03-21 NOTE — Patient Instructions (Addendum)
Medication Instructions:  CHANGE YOUR FUROSEMIDE TO AS NEEDED FOR SWELLING  START HYDROCHLOROTHIAZIDE 25 MG DAILY    Labwork: BMET IN 1 WEEK    Testing/Procedures: Your physician has recommended that you have a sleep study. This test records several body functions during sleep, including: brain activity, eye movement, oxygen and carbon dioxide blood levels, heart rate and rhythm, breathing rate and rhythm, the flow of air through your mouth and nose, snoring, body muscle movements, and chest and belly movement. ONCE INSURANCE HAS APPROVED WILL CALL YOU TO ARRANGE    Follow-Up: 04/25/2020 AT 2:00 WITH PHARM D   WILL HAVE ISABELLE SOCIAL WORKER BE IN [place]. IF YOU DO NOT HEAR FROM HER IN 2 WEEKS CALL THE OFFICE TO FOLLOW UP    Special Instructions:   MONITOR YOUR BLOOD PRESSURE TWICE A DAY, LOG IN THE BOOK PROVIDED. BRING THE BOOK AND YOUR BLOOD PRESSURE MACHINE TO YOUR FOLLOW UP IN 1 MONTH   DASH Eating Plan DASH stands for "Dietary Approaches to Stop Hypertension." The DASH eating plan is a healthy eating plan that has been shown to reduce high blood pressure (hypertension). It may also reduce your risk for type 2 diabetes, heart disease, and stroke. The DASH eating plan may also help with weight loss. What are tips for following this plan?  General guidelines  Avoid eating more than 2,300 mg (milligrams) of salt (sodium) a day. If you have hypertension, you may need to reduce your sodium intake to 1,500 mg a day.  Limit alcohol intake to no more than 1 drink a day for nonpregnant women and 2 drinks a day for men. One drink equals 12 oz of beer, 5 oz of wine, or 1 oz of hard liquor.  Work with your health care provider to maintain a healthy body weight or to lose weight. Ask what an ideal weight is for you.  Get at least 30 minutes of exercise that causes your heart to beat faster (aerobic exercise) most days of the week. Activities may include walking, swimming, or biking.  Work  with your health care provider or diet and nutrition specialist (dietitian) to adjust your eating plan to your individual calorie needs. Reading food labels   Check food labels for the amount of sodium per serving. Choose foods with less than 5 percent of the Daily Value of sodium. Generally, foods with less than 300 mg of sodium per serving fit into this eating plan.  To find whole grains, look for the word "whole" as the first word in the ingredient list. Shopping  Buy products labeled as "low-sodium" or "no salt added."  Buy fresh foods. Avoid canned foods and premade or frozen meals. Cooking  Avoid adding salt when cooking. Use salt-free seasonings or herbs instead of table salt or sea salt. Check with your health care provider or pharmacist before using salt substitutes.  Do not fry foods. Cook foods using healthy methods such as baking, boiling, grilling, and broiling instead.  Cook with heart-healthy oils, such as olive, canola, soybean, or sunflower oil. Meal planning  Eat a balanced diet that includes: ? 5 or more servings of fruits and vegetables each day. At each meal, try to fill half of your plate with fruits and vegetables. ? Up to 6-8 servings of whole grains each day. ? Less than 6 oz of lean meat, poultry, or fish each day. A 3-oz serving of meat is about the same size as a deck of cards. One egg equals 1 oz. ?  2 servings of low-fat dairy each day. ? A serving of nuts, seeds, or beans 5 times each week. ? Heart-healthy fats. Healthy fats called Omega-3 fatty acids are found in foods such as flaxseeds and coldwater fish, like sardines, salmon, and mackerel.  Limit how much you eat of the following: ? Canned or prepackaged foods. ? Food that is high in trans fat, such as fried foods. ? Food that is high in saturated fat, such as fatty meat. ? Sweets, desserts, sugary drinks, and other foods with added sugar. ? Full-fat dairy products.  Do not salt foods before  eating.  Try to eat at least 2 vegetarian meals each week.  Eat more home-cooked food and less restaurant, buffet, and fast food.  When eating at a restaurant, ask that your food be prepared with less salt or no salt, if possible. What foods are recommended? The items listed may not be a complete list. Talk with your dietitian about what dietary choices are best for you. Grains Whole-grain or whole-wheat bread. Whole-grain or whole-wheat pasta. Brown rice. Modena Morrow. Bulgur. Whole-grain and low-sodium cereals. Pita bread. Low-fat, low-sodium crackers. Whole-wheat flour tortillas. Vegetables Fresh or frozen vegetables (raw, steamed, roasted, or grilled). Low-sodium or reduced-sodium tomato and vegetable juice. Low-sodium or reduced-sodium tomato sauce and tomato paste. Low-sodium or reduced-sodium canned vegetables. Fruits All fresh, dried, or frozen fruit. Canned fruit in natural juice (without added sugar). Meat and other protein foods Skinless chicken or [place]. Ground chicken or [place]. Pork with fat trimmed off. Fish and seafood. Egg whites. Dried beans, peas, or lentils. Unsalted nuts, nut butters, and seeds. Unsalted canned beans. Lean cuts of beef with fat trimmed off. Low-sodium, lean deli meat. Dairy Low-fat (1%) or fat-free (skim) milk. Fat-free, low-fat, or reduced-fat cheeses. Nonfat, low-sodium ricotta or cottage cheese. Low-fat or nonfat yogurt. Low-fat, low-sodium cheese. Fats and oils Soft margarine without trans fats. Vegetable oil. Low-fat, reduced-fat, or light mayonnaise and salad dressings (reduced-sodium). Canola, safflower, olive, soybean, and sunflower oils. Avocado. Seasoning and other foods Herbs. Spices. Seasoning mixes without salt. Unsalted popcorn and pretzels. Fat-free sweets. What foods are not recommended? The items listed may not be a complete list. Talk with your dietitian about what dietary choices are best for you. Grains Baked goods made with fat,  such as croissants, muffins, or some breads. Dry pasta or rice meal packs. Vegetables Creamed or fried vegetables. Vegetables in a cheese sauce. Regular canned vegetables (not low-sodium or reduced-sodium). Regular canned tomato sauce and paste (not low-sodium or reduced-sodium). Regular tomato and vegetable juice (not low-sodium or reduced-sodium). Angie Fava. Olives. Fruits Canned fruit in a light or heavy syrup. Fried fruit. Fruit in cream or butter sauce. Meat and other protein foods Fatty cuts of meat. Ribs. Fried meat. Berniece Salines. Sausage. Bologna and other processed lunch meats. Salami. Fatback. Hotdogs. Bratwurst. Salted nuts and seeds. Canned beans with added salt. Canned or smoked fish. Whole eggs or egg yolks. Chicken or [place] with skin. Dairy Whole or 2% milk, cream, and half-and-half. Whole or full-fat cream cheese. Whole-fat or sweetened yogurt. Full-fat cheese. Nondairy creamers. Whipped toppings. Processed cheese and cheese spreads. Fats and oils Butter. Stick margarine. Lard. Shortening. Ghee. Bacon fat. Tropical oils, such as coconut, palm kernel, or palm oil. Seasoning and other foods Salted popcorn and pretzels. Onion salt, garlic salt, seasoned salt, table salt, and sea salt. Worcestershire sauce. Tartar sauce. Barbecue sauce. Teriyaki sauce. Soy sauce, including reduced-sodium. Steak sauce. Canned and packaged gravies. Fish sauce. Oyster sauce. Cocktail sauce.  Horseradish that you find on the shelf. Ketchup. Mustard. Meat flavorings and tenderizers. Bouillon cubes. Hot sauce and Tabasco sauce. Premade or packaged marinades. Premade or packaged taco seasonings. Relishes. Regular salad dressings. Where to find more information:  National Heart, Lung, and Blood Institute: PopSteam.is  American Heart Association: www.heart.org Summary  The DASH eating plan is a healthy eating plan that has been shown to reduce high blood pressure (hypertension). It may also reduce your risk for  type 2 diabetes, heart disease, and stroke.  With the DASH eating plan, you should limit salt (sodium) intake to 2,300 mg a day. If you have hypertension, you may need to reduce your sodium intake to 1,500 mg a day.  When on the DASH eating plan, aim to eat more fresh fruits and vegetables, whole grains, lean proteins, low-fat dairy, and heart-healthy fats.  Work with your health care provider or diet and nutrition specialist (dietitian) to adjust your eating plan to your individual calorie needs. This information is not intended to replace advice given to you by your health care provider. Make sure you discuss any questions you have with your health care provider. Document Released: 01/30/2011 Document Revised: 01/23/2017 Document Reviewed: 02/04/2016 Elsevier Patient Education  2020 [employer].

## 2020-03-22 ENCOUNTER — Telehealth: Payer: Self-pay | Admitting: Licensed Clinical Social Worker

## 2020-03-22 NOTE — Progress Notes (Signed)
Heart and Vascular Care Navigation  03/22/2020  Melvin Ray [birth year] [id number]  Reason for Referral:  CSW received request to call pt, no details.                                                                                                   Assessment:                                    CSW reached  out to pt via telephone, was able to reach him at [phone]. Introduced self, role, reason for call. Pt had just woken up but was agreeable to speak with this Clinical research associate. He confirmed home address and contact phone number and clarified that emergency contact is his social worker "through the county," but doesn't have any additional details on who they are/why they are involved with his care. He lives alone with his dog. Pt confirmed [facility] Medicaid coverage, and that he doesn't have any issues affording or obtaining medications, they are mailed directly to him.   He denies any current issues with paying for bills, shares that he receives monthly SSI and SNAP (food stamps). Usually when he needs to get to appointments he states his social worker and their team help him with that. Denies any family living close by him in [place].   CSW explained that we can refer him to [facility] as well and they would be able to assist with [facility] provider appointments as needed. Pt gives permission for this writer to send his referral to [employer] and is aware that he will receive communication to complete the waiver.   CSW also checked with pt regarding any mental health concerns, provided support that living alone during the pandemic with limited ability to get out and about must be difficult. Pt shares that he feels he is currently doing alright, has support from Child psychotherapist and team from county.   Inquired if there were any additional concerns/needs that pt may have shared with provider yesterday during his appointment. Pt denies anything at this time.  Is aware that I remain available should additional concerns arise.    HRT/VAS Care Coordination    Patients Home Cardiology Office [facility]   Outpatient Care Team Social Worker   Social Worker Name: Esmeralda Links Logan, [phone]   Living arrangements for the past 2 months Apartment   Lives with: Pets   Patient Current Insurance Coverage Medicaid   Patient Has Concern With Paying Medical Bills No   Does Patient Have Prescription Coverage? Yes   Home Assistive Devices/Equipment BIPAP      Social History:  SDOH Screenings   Alcohol Screen: Not on file  Depression (XHB7-1): Not on file  Financial Resource Strain: Low Risk   . Difficulty of Paying Living Expenses: Not very hard  Food Insecurity: No Food Insecurity  . Worried About Programme researcher, broadcasting/film/video in the Last Year: Never true  . Ran Out of Food in the Last Year: Never true  Housing: Low Risk   . Last Housing Risk Score: 0  Physical Activity: Not on file  Social Connections: Not on file  Stress: Not on file  Tobacco Use: High Risk  . Smoking Tobacco Use: Current Some Day Smoker  . Smokeless Tobacco Use: Former Dispensing optician Needs: Armed forces operational officer  . Lack of Transportation (Medical): Yes  . Lack of Transportation (Non-Medical): Yes    SDOH Interventions: Financial Resources:  Corporate treasurer Interventions: Intervention Not Indicated  Food Insecurity:  Food Insecurity Interventions: Intervention Not Indicated  Housing Insecurity:  Housing Interventions: Intervention Not Indicated  Transportation:   Transportation Interventions: Retail banker    Other Care Navigation Interventions:     Provided Pharmacy assistance resources Pt declined any needs at this time.   Patient expressed Mental Health concerns Pt denied any needs at this time.   Patient Referred to: Transportation Services    Follow-up plan:   LCSW has mailed pt my card with contact information, [employer] card, information about [employer] and [employer] transportation services in [place]. I also sent referral to [facility].

## 2020-03-26 ENCOUNTER — Encounter: Payer: Self-pay | Admitting: Cardiovascular Disease

## 2020-03-27 ENCOUNTER — Telehealth: Payer: Self-pay | Admitting: Licensed Clinical Social Worker

## 2020-03-27 ENCOUNTER — Telehealth: Payer: Self-pay | Admitting: Cardiovascular Disease

## 2020-03-27 NOTE — Telephone Encounter (Signed)
Auth submitted through [facility], case ID: [id number]

## 2020-03-27 NOTE — Telephone Encounter (Signed)
Received clarification that pt had been interested in driving lessons/how to get his license when he last saw Dr. Duke Salvia. I have mailed pt information about how to obtain a license as an adult in [place]. I also included information for two local driving schools with the clarification that I cannot vouch for either company but that it may be a place to start. My contact information has been provided for pt should any additional concerns/needs arise.   Melvin Ray, MSW, LCSW [facility] Health Heart/Vascular Care Navigation  [phone]

## 2020-04-05 ENCOUNTER — Telehealth: Payer: Self-pay | Admitting: *Deleted

## 2020-04-05 NOTE — Telephone Encounter (Signed)
Attempted to call patient to notify of sleep study appointment. Voice mailbox has not been set up. Will attempt to contact again at a later time.

## 2020-04-06 NOTE — Telephone Encounter (Signed)
Called and notified patient of sleep study appointment scheduled at [facility] on 04/19/20. Also informed him that he should not need the planned appointment for sleep consult on 05/07/20 with Dr Virgel Manifold, unless he plans to be followed by that physician. Patient states that he understands and will have Dr Vincenza Hews to cancel it.

## 2020-04-08 NOTE — Progress Notes (Deleted)
Cardiology Office Note  Date: 04/08/2020   ID: Melvin Ray, DOB April 18, 1985, MRN [id number]  PCP:  Waldon Reining, MD  Cardiologist:  Dina Rich, MD Electrophysiologist:  None   Chief Complaint: Cardiac follow up  History of Present Illness: Melvin Ray is a 35 y.o. male with a history of hypertension, DM 2, morbid obesity, OSA on CPAP, tobacco abuse.  Last encounter with Dr. Duke Salvia at advanced hypertension clinic on 03/21/2020.  He was started on HCTZ 25 mg daily.  He was struggling with super morbid obesity deemed likely the underlying cause of hypertension.  Discussion took place regarding increasing physical activity to lose weight 150 minutes weekly.  Continuing clonidine, diltiazem, hydralazine, losartan, metoprolol.  In the future consider switching metoprolol to a more effective beta-blocker such as carvedilol was recommended.  Hydralazine could be titrated. Dr. Duke Salvia was getting him set up for another sleep study to obtain a new CPAP given his machine was not working properly.  He was advised to measure blood pressure twice a day and log in the book provided.  He was to bring the log  and blood pressure machine to follow-up.  He was to have basic metabolic panel 1 week after starting HCTZ.   Past Medical History:  Diagnosis Date  . Diabetes mellitus without complication (HCC)   . Hypertension   . Morbid obesity (HCC)   . Obstructive sleep apnea   . Onychomycosis   . Tobacco abuse     No past surgical history on file.  Current Outpatient Medications  Medication Sig Dispense Refill  . ACCU-CHEK GUIDE test strip 3 (three) times daily.    . Accu-Chek Softclix Lancets lancets 3 (three) times daily.    Marland Kitchen albuterol (VENTOLIN HFA) 108 (90 Base) MCG/ACT inhaler Inhale into the lungs every 6 (six) hours as needed for wheezing or shortness of breath.    . cloNIDine (CATAPRES) 0.1 MG tablet Take 0.1 mg by mouth 2 (two) times daily.    Marland Kitchen diltiazem (CARDIZEM CD) 240 MG 24 hr  capsule Take 240 mg by mouth daily.    . empagliflozin (JARDIANCE) 10 MG TABS tablet Take 10 mg by mouth daily.    Marland Kitchen FLUoxetine (PROZAC) 20 MG tablet Take 20 mg by mouth daily.    . Fluticasone-Salmeterol (ADVAIR HFA IN) Inhale into the lungs.    . furosemide (LASIX) 20 MG tablet Take 20 mg by mouth as needed (swelling).    . hydrALAZINE (APRESOLINE) 25 MG tablet Take 1 tablet (25 mg total) by mouth 3 (three) times daily. 270 tablet 3  . hydrochlorothiazide (HYDRODIURIL) 25 MG tablet Take 1 tablet (25 mg total) by mouth daily. 90 tablet 1  . ibuprofen (ADVIL) 400 MG tablet Take 400 mg by mouth 3 (three) times daily.    Marland Kitchen LANTUS SOLOSTAR 100 UNIT/ML Solostar Pen Inject 20 Units into the skin at bedtime.    Marland Kitchen losartan (COZAAR) 100 MG tablet Take 100 mg by mouth daily.    . metoprolol (TOPROL-XL) 200 MG 24 hr tablet Take 200 mg by mouth daily.    . pantoprazole (PROTONIX) 40 MG tablet Take 40 mg by mouth daily.    . potassium chloride (KLOR-CON) 10 MEQ tablet Take 10 mEq by mouth daily.    . traZODone (DESYREL) 100 MG tablet Take 200 mg by mouth at bedtime.      No current facility-administered medications for this visit.   Allergies:  Patient has no known allergies.   Social History: The patient  reports that he has been smoking cigars. He has quit using smokeless tobacco. He reports that he does not drink alcohol and does not use drugs.   Family History: The patient's family history includes Diabetes in his maternal grandmother; Heart failure in his father and paternal grandmother.   ROS:  Please see the history of present illness. Otherwise, complete review of systems is positive for {NONE DEFAULTED:18576::"none"}.  All other systems are reviewed and negative.   Physical Exam: VS:  There were no vitals taken for this visit., BMI There is no height or weight on file to calculate BMI.  Wt Readings from Last 3 Encounters:  03/21/20 (!) 456 lb (206.8 kg)  02/07/20 (!) 452 lb (205 kg)   01/13/20 (!) 449 lb 12.8 oz (204 kg)    General: Patient appears comfortable at rest. HEENT: Conjunctiva and lids normal, oropharynx clear with moist mucosa. Neck: Supple, no elevated JVP or carotid bruits, no thyromegaly. Lungs: Clear to auscultation, nonlabored breathing at rest. Cardiac: Regular rate and rhythm, no S3 or significant systolic murmur, no pericardial rub. Abdomen: Soft, nontender, no hepatomegaly, bowel sounds present, no guarding or rebound. Extremities: No pitting edema, distal pulses 2+. Skin: Warm and dry. Musculoskeletal: No kyphosis. Neuropsychiatric: Alert and oriented x3, affect grossly appropriate.  ECG:  {EKG/Telemetry Strips [phone]}  Recent Labwork: No results found for requested labs within last 8760 hours.  No results found for: CHOL, TRIG, HDL, CHOLHDL, VLDL, LDLCALC, LDLDIRECT  Other Studies Reviewed Today:  Echocardiogram 12/28/2019  1. Left ventricular ejection fraction, by estimation, is 60 to 65%. The left ventricle has normal function. The left ventricle has no regional wall motion abnormalities. The left ventricular internal cavity size was mildly dilated. There is mild left ventricular hypertrophy. Left ventricular diastolic parameters are indeterminate. 2. Right ventricular systolic function is normal. The right ventricular size is normal. Tricuspid regurgitation signal is inadequate for assessing PA pressure. 3. The mitral valve is grossly normal. Trivial mitral valve regurgitation. 4. The aortic valve is tricuspid. Aortic valve regurgitation is not visualized. 5. The inferior vena cava is dilated in size with >50% respiratory variability, suggesting right atrial pressure of 8 mmHg.   Assessment and Plan:   1. Primary hypertension   2. OSA on CPAP   3. Morbid obesity (HCC)       Medication Adjustments/Labs and Tests Ordered: Current medicines are reviewed at length with the patient today.  Concerns regarding  medicines are outlined above.   Disposition: Follow-up with ***  Signed, Rennis Harding, NP 04/08/2020 8:52 PM    [facility] at [facility] [address] [place], [place], [place] [address] Phone: [phone]; Fax: [phone]

## 2020-04-09 ENCOUNTER — Ambulatory Visit: Payer: Medicaid Other | Admitting: Family Medicine

## 2020-04-09 DIAGNOSIS — G4733 Obstructive sleep apnea (adult) (pediatric): Secondary | ICD-10-CM

## 2020-04-09 DIAGNOSIS — I10 Essential (primary) hypertension: Secondary | ICD-10-CM

## 2020-04-10 ENCOUNTER — Encounter: Payer: Self-pay | Admitting: Podiatry

## 2020-04-10 ENCOUNTER — Ambulatory Visit (INDEPENDENT_AMBULATORY_CARE_PROVIDER_SITE_OTHER): Payer: Medicaid Other | Admitting: Podiatry

## 2020-04-10 ENCOUNTER — Other Ambulatory Visit: Payer: Self-pay

## 2020-04-10 DIAGNOSIS — E1159 Type 2 diabetes mellitus with other circulatory complications: Secondary | ICD-10-CM | POA: Diagnosis not present

## 2020-04-10 DIAGNOSIS — B351 Tinea unguium: Secondary | ICD-10-CM | POA: Diagnosis not present

## 2020-04-10 DIAGNOSIS — M79674 Pain in right toe(s): Secondary | ICD-10-CM

## 2020-04-10 DIAGNOSIS — M79675 Pain in left toe(s): Secondary | ICD-10-CM | POA: Diagnosis not present

## 2020-04-10 NOTE — Progress Notes (Signed)
This patient returns to my office for at risk foot care.  This patient requires this care by a professional since this patient will be at risk due to having diabetes.   This patient is unable to cut nails himself since the patient cannot reach his nails.These nails are painful walking and wearing shoes.  This patient presents for at risk foot care today.  He is brought to the office by caregiver from  [place].  General Appearance  Alert, conversant and in no acute stress.  Vascular  Dorsalis pedis and posterior tibial  pulses are not  palpable  Bilaterally due to swelling..  Capillary return is within normal limits  bilaterally. Temperature is within normal limits  bilaterally.  Neurologic  Senn-Weinstein monofilament wire test within normal limits  bilaterally. Muscle power within normal limits bilaterally.  Nails Thick disfigured discolored nails with subungual debris  from hallux to fifth toes bilaterally. No evidence of bacterial infection or drainage bilaterally.  Orthopedic  No limitations of motion  feet .  No crepitus or effusions noted.  No bony pathology or digital deformities noted.  Skin  normotropic skin with no porokeratosis noted bilaterally.  No signs of infections or ulcers noted.     Onychomycosis  Pain in right toes  Pain in left toes  Consent was obtained for treatment procedures.   Mechanical debridement of nails 1-5  bilaterally performed with a nail nipper.  Filed with dremel without incident.    Return office visit   3 months                 Told patient to return for periodic foot care and evaluation due to potential at risk complications.   Helane Gunther [name]

## 2020-04-19 ENCOUNTER — Ambulatory Visit: Payer: Medicaid Other | Attending: Cardiovascular Disease | Admitting: Cardiovascular Disease

## 2020-04-19 ENCOUNTER — Other Ambulatory Visit: Payer: Self-pay

## 2020-04-19 DIAGNOSIS — G4733 Obstructive sleep apnea (adult) (pediatric): Secondary | ICD-10-CM | POA: Diagnosis not present

## 2020-04-19 DIAGNOSIS — R0902 Hypoxemia: Secondary | ICD-10-CM | POA: Insufficient documentation

## 2020-04-19 DIAGNOSIS — R0683 Snoring: Secondary | ICD-10-CM

## 2020-04-19 DIAGNOSIS — G4736 Sleep related hypoventilation in conditions classified elsewhere: Secondary | ICD-10-CM | POA: Diagnosis not present

## 2020-04-19 DIAGNOSIS — I10 Essential (primary) hypertension: Secondary | ICD-10-CM

## 2020-04-23 ENCOUNTER — Telehealth: Payer: Self-pay | Admitting: Licensed Clinical Social Worker

## 2020-04-23 NOTE — Telephone Encounter (Signed)
Attempted to reach pt to ensure he has a ride to office on 3/2.  No answer, unable to leave message as no voicemail has been set up.   Melvin Ray, MSW, LCSW [facility] Health Heart/Vascular Care Navigation  [phone]

## 2020-04-23 NOTE — Telephone Encounter (Signed)
Called again and was able to reach pt at [phone].  Pt confirms he has a ride to his appointment on 3/2. He has my number to call if any questions/concerns.   Octavio Graves, MSW, LCSW [facility] Health Heart/Vascular Care Navigation  [phone]

## 2020-04-25 ENCOUNTER — Ambulatory Visit (INDEPENDENT_AMBULATORY_CARE_PROVIDER_SITE_OTHER): Payer: Medicaid Other | Admitting: Pharmacist

## 2020-04-25 ENCOUNTER — Other Ambulatory Visit: Payer: Self-pay

## 2020-04-25 VITALS — BP 178/126 | HR 76 | Resp 17 | Ht 72.0 in

## 2020-04-25 DIAGNOSIS — I10 Essential (primary) hypertension: Secondary | ICD-10-CM | POA: Diagnosis not present

## 2020-04-25 MED ORDER — CHLORTHALIDONE 25 MG PO TABS: 25.0000 mg | ORAL_TABLET | Freq: Every day | ORAL | 1 refills | Status: DC

## 2020-04-25 NOTE — Progress Notes (Signed)
Patient ID: Melvin Ray                 DOB: 14-Apr-1985                      MRN: [id number]     HPI: Melvin Ray is a 35 y.o. male referred by Dr. Duke Ray  to HTN clinic. PMH include diabetes, OSA on CPAP, tobacco abuse, obesity, hypertension, low extremity edema, and shortness of breath. Patient reports compliance with all medication. He also report hx of A.Fib diagnosed for 1st time during Ray admission in 2020. He comes to this appointment accompany by social worker from [facility]. Social worker assist with transportation to laboratory, medical appointment, and grocery shopping.  All medications are delivered by his pharmacy to his home.  Current HTN meds:  Clonidine 0.1mg  twice daily Diltiazem CD 240mg  daily Furosemide 20mg  daily  Hydralazine 25mg  TID (wake up- 4hrs-bedtime) HCTZ 25mg  daily - not taking - never received from pharmacy Losartan 100mg  daily Metoprolol succinate 200mg  daily  Previously tried:  Lisinopril  Labetalol amlodipine  BP goal: 130/80  Family History:  includes Diabetes in his maternal grandmother; Heart failure in his father and paternal grandmother  Social History: denies alcohol use, occasional cigar smoker?  Diet: "poor", high sodium, unable to get groceries by himself. Orders take-out ~ 3 times per week via . Social worker with will take him to grocery store today and will try to help with food acquisition.  Exercise: sedentary. activities of daily living  Home BP readings: none provided today  Wt Readings from Last 3 Encounters:  03/21/20 (!) 456 lb (206.8 kg)  02/07/20 (!) 452 lb (205 kg)  01/13/20 (!) 449 lb 12.8 oz (204 kg)   BP Readings from Last 3 Encounters:  04/25/20 (!) 178/126  03/21/20 (!) 166/102  02/07/20 (!) 174/120   Pulse Readings from Last 3 Encounters:  04/25/20 76  03/21/20 75  02/07/20 71    Past Medical History:  Diagnosis Date  . Diabetes mellitus without complication (HCC)   .  Hypertension   . Morbid obesity (HCC)   . Obstructive sleep apnea   . Onychomycosis   . Tobacco abuse     Current Outpatient Medications on File Prior to Visit  Medication Sig Dispense Refill  . ACCU-CHEK GUIDE test strip 3 (three) times daily.    . Accu-Chek Softclix Lancets lancets 3 (three) times daily.    06/25/20 albuterol (VENTOLIN HFA) 108 (90 Base) MCG/ACT inhaler Inhale into the lungs every 6 (six) hours as needed for wheezing or shortness of breath.    . cloNIDine (CATAPRES) 0.1 MG tablet Take 0.1 mg by mouth 2 (two) times daily.    03/23/20 diltiazem (CARDIZEM CD) 240 MG 24 hr capsule Take 240 mg by mouth daily.    . empagliflozin (JARDIANCE) 10 MG TABS tablet Take 10 mg by mouth daily.    02/09/20 FLUoxetine (PROZAC) 20 MG tablet Take 20 mg by mouth daily.    . Fluticasone-Salmeterol (ADVAIR HFA IN) Inhale into the lungs.    . furosemide (LASIX) 20 MG tablet Take 20 mg by mouth as needed (swelling).    . hydrALAZINE (APRESOLINE) 25 MG tablet Take 1 tablet (25 mg total) by mouth 3 (three) times daily. 270 tablet 3  . ibuprofen (ADVIL) 400 MG tablet Take 400 mg by mouth 3 (three) times daily.    06/25/20 LANTUS SOLOSTAR 100 UNIT/ML Solostar Pen Inject 20 Units into the  skin at bedtime.    Marland Kitchen losartan (COZAAR) 100 MG tablet Take 100 mg by mouth daily.    . metoprolol (TOPROL-XL) 200 MG 24 hr tablet Take 200 mg by mouth daily.    . pantoprazole (PROTONIX) 40 MG tablet Take 40 mg by mouth daily.    . potassium chloride (KLOR-CON) 10 MEQ tablet Take 10 mEq by mouth daily.    . traZODone (DESYREL) 100 MG tablet Take 200 mg by mouth at bedtime.      No current facility-administered medications on file prior to visit.    No Known Allergies  Blood pressure (!) 178/126, pulse 76, resp. rate 17, height 6' (1.829 m), SpO2 92 %.  Hypertension BP remains above goal d patient was unable to start HCTZ as prescribed. He resumed furosemide and reports improvemnt in [place].  We discussed positive lifestyle modifications  to benefit blood pressure and heart health. Patient will try to cook more at home and start walking 10 minutes every other day. He agreed on telephone follow up with [facility] Ray to keep him accountable and provide additional tools as needed.  Will stop HCTZ, start chlorthalidone 25mg  daily, repeat BMET in 2 weeks, and schedule follow up in 4 weeks. Patient plans to complete BMET at [facility] close home, and bring BP cuff with BP records to next follow up.    Melvin Ray PharmD, BCPS, CPP [facility] [address] [place] 05/01/2020 12:16 PM

## 2020-04-25 NOTE — Patient Instructions (Addendum)
Return for a follow up appointment on 05/24/20 at 11am   Go to the lab in 2 weeks   Check your blood pressure at home daily (if able) and keep record of the readings.  Take your BP meds as follows: start chlorathalidone 25mg  daily and continue all other medications.  Try to walk 10 minutes every other day   Will receive a call from [facility] Guide   Eat chinese take out only once per month  Bring all of your meds, your BP cuff and your record of home blood pressures to your next appointment.  Exercise as you're able, try to walk approximately 30 minutes per day.  Keep salt intake to a minimum, especially watch canned and prepared boxed foods.  Eat more fresh fruits and vegetables and fewer canned items.  Avoid eating in fast food restaurants.    HOW TO TAKE YOUR BLOOD PRESSURE: . Rest 5 minutes before taking your blood pressure. .  Don't smoke or drink caffeinated beverages for at least 30 minutes before. . Take your blood pressure before (not after) you eat. . Sit comfortably with your back supported and both feet on the floor (don't cross your legs). . Elevate your arm to heart level on a table or a desk. . Use the proper sized cuff. It should fit smoothly and snugly around your bare upper arm. There should be enough room to slip a fingertip under the cuff. The bottom edge of the cuff should be 1 inch above the crease of the elbow. . Ideally, take 3 measurements at one sitting and record the average.

## 2020-05-01 ENCOUNTER — Encounter: Payer: Self-pay | Admitting: Pharmacist

## 2020-05-01 NOTE — Assessment & Plan Note (Signed)
BP remains above goal d patient was unable to start HCTZ as prescribed. He resumed furosemide and reports improvemnt in [place].  We discussed positive lifestyle modifications to benefit blood pressure and heart health. Patient will try to cook more at home and start walking 10 minutes every other day. He agreed on telephone follow up with Renaee Munda to keep him accountable and provide additional tools as needed.  Will stop HCTZ, start chlorthalidone 25mg  daily, repeat BMET in 2 weeks, and schedule follow up in 4 weeks. Patient plans to complete BMET at [facility] close home, and bring BP cuff with BP records to next follow up.

## 2020-05-05 ENCOUNTER — Encounter: Payer: Self-pay | Admitting: Cardiovascular Disease

## 2020-05-05 NOTE — Procedures (Signed)
[facility]        Patient Name: Melvin, Ray Date: 04/19/2020 Gender: Male D.O.B: December 07, 1985 Age (years): 34 Referring Provider: Chilton Si Height (inches): 73 Interpreting Physician: Nicki Guadalajara MD, ABSM Weight (lbs): 456 RPSGT: Peak, Robert BMI: 60 MRN: [id number] Neck Size: 22.00  CLINICAL INFORMATION Sleep Study Type: NPSG  Indication for sleep study: OSA on CPAP with current machine malfunction  Epworth Sleepiness Score: 3  SLEEP STUDY TECHNIQUE As per the AASM Manual for the Scoring of Sleep and Associated Events v2.3 (April 2016) with a hypopnea requiring 4% desaturations.  The channels recorded and monitored were frontal, central and occipital EEG, electrooculogram (EOG), submentalis EMG (chin), nasal and oral airflow, thoracic and abdominal wall motion, anterior tibialis EMG, snore microphone, electrocardiogram, and pulse oximetry.  MEDICATIONS albuterol (VENTOLIN HFA) 108 (90 Base) MCG/ACT inhaler chlorthalidone (HYGROTON) 25 MG tablet cloNIDine (CATAPRES) 0.1 MG tablet diltiazem (CARDIZEM CD) 240 MG 24 hr capsule empagliflozin (JARDIANCE) 10 MG TABS tablet FLUoxetine (PROZAC) 20 MG tablet Fluticasone-Salmeterol (ADVAIR HFA IN) furosemide (LASIX) 20 MG tablet hydrALAZINE (APRESOLINE) 25 MG tablet(Expired) ibuprofen (ADVIL) 400 MG tablet LANTUS SOLOSTAR 100 UNIT/ML Solostar Pen losartan (COZAAR) 100 MG tablet metoprolol (TOPROL-XL) 200 MG 24 hr tablet pantoprazole (PROTONIX) 40 MG tablet potassium chloride (KLOR-CON) 10 MEQ tablet traZODone (DESYREL) 100 MG tablet Medications self-administered by patient taken the night of the study : N/A  SLEEP ARCHITECTURE The study was initiated at 10:20:43 PM and ended at 4:23:01 AM.  Sleep onset time was 85.6 minutes and the sleep efficiency was 4.6%. The total sleep time was 16.5 minutes.  Stage REM latency was N/A minutes.  The patient spent 93.94% of the night in stage N1 sleep,  6.06% in stage N2 sleep, 0.00% in stage N3 and 0% in REM.  Alpha intrusion was absent.  Supine sleep was 51.52%.  RESPIRATORY PARAMETERS The overall apnea/hypopnea index (AHI) was 36.4 per hour.  The respiratory disturbance index (RDI) was 36.4/h. There were 0 total apneas, including 0 obstructive, 0 central and 0 mixed apneas. There were 10 hypopneas and 0 RERAs.  The AHI during Stage REM sleep was N/A per hour.  AHI while supine was 70.6 per hour.  The minimum SpO2 during sleep was 79.8%.  No snoring was noted during this study.  CARDIAC DATA The 2 lead EKG demonstrated sinus rhythm. The mean heart rate was N/A beats per minute. Other EKG findings include: PVCs.  LEG MOVEMENT DATA The total PLMS were 0 with a resulting PLMS index of 0.00. Associated arousal with leg movement index was 0.0 .  IMPRESSIONS - Severe obstructive sleep apnea occurred during this study (AHI 36.4/h) with absent REM sleep.  Events were very severe with supine sleep (AHI 70.6/h). - Severe oxygen desaturation to a nadir of 79.9% - Abnormal sleep architecture with absent slow wave and REM sleep. - No snoring was audible during this study. - EKG findings include PVCs. - Clinically significant periodic limb movements did not occur during sleep. No significant associated arousals.  DIAGNOSIS - Obstructive Sleep Apnea (G47.33) - Nocturnal Hypoxemia (G47.36)  RECOMMENDATIONS - Expeditious therapeutic CPAP titration to determine optimal pressure required to alleviate sleep disordered breathing. - Effort should be made to optimize nasala nd oropharyngeal patency. - Positional therapy avoiding supine position during sleep. - Avoid alcohol, sedatives and other CNS depressants that may worsen sleep apnea and disrupt normal sleep architecture. - Sleep hygiene should be reviewed  to assess factors that may improve sleep quality. - Weight management (BMI 60) and regular exercise should be initiated or continued if  appropriate.  [Electronically signed] 05/05/2020 08:47 AM  Nicki Guadalajara MD, [facility], ABSM Diplomate, American Board of Sleep Medicine   NPI: [id number] [facility] PH: [phone]   FX: [phone] ACCREDITED BY THE AMERICAN ACADEMY OF SLEEP MEDICINE

## 2020-05-07 ENCOUNTER — Telehealth (HOSPITAL_COMMUNITY): Payer: Self-pay

## 2020-05-07 ENCOUNTER — Institutional Professional Consult (permissible substitution): Payer: Medicaid Other | Admitting: Pulmonary Disease

## 2020-05-07 DIAGNOSIS — Z0000 Encounter for general adult medical examination without abnormal findings: Secondary | ICD-10-CM

## 2020-05-07 NOTE — Telephone Encounter (Signed)
Patient was called by MPH Candidate for Health Coaching on 05/07/20 and was unable to leave a message. Intern will call patient back on 05/08/20.  Romney Compean, MS, ERHD, [facility] Care Guide 05/07/2020 3:30 PM

## 2020-05-07 NOTE — Telephone Encounter (Signed)
Tried to connect with patient via telephone for health coaching. However, was not able to connect with patient due to the fact that voicemail was not set-up. Will follow back up with patient on 03/15. Lamar Blinks., MPH Candidate H&V Health Coach Intern

## 2020-05-11 ENCOUNTER — Ambulatory Visit (INDEPENDENT_AMBULATORY_CARE_PROVIDER_SITE_OTHER): Payer: Medicaid Other | Admitting: Family Medicine

## 2020-05-11 ENCOUNTER — Encounter: Payer: Self-pay | Admitting: Family Medicine

## 2020-05-11 VITALS — BP 158/100 | HR 65 | Ht 72.0 in | Wt >= 6400 oz

## 2020-05-11 DIAGNOSIS — Z720 Tobacco use: Secondary | ICD-10-CM | POA: Diagnosis not present

## 2020-05-11 DIAGNOSIS — G4733 Obstructive sleep apnea (adult) (pediatric): Secondary | ICD-10-CM | POA: Diagnosis not present

## 2020-05-11 DIAGNOSIS — I10 Essential (primary) hypertension: Secondary | ICD-10-CM

## 2020-05-11 MED ORDER — HYDRALAZINE HCL 50 MG PO TABS
50.0000 mg | ORAL_TABLET | Freq: Three times a day (TID) | ORAL | 3 refills | Status: DC
Start: 2020-05-11 — End: 2021-10-25

## 2020-05-11 NOTE — Patient Instructions (Signed)
Medication Instructions:   Increase Hydralazine to 50mg  three times per day.  Continue all other medications.    Labwork: none  Testing/Procedures: none  Follow-Up: 6 months   Any Other Special Instructions Will Be Listed Below (If Applicable).  If you need a refill on your cardiac medications before your next appointment, please call your pharmacy.

## 2020-05-11 NOTE — Progress Notes (Signed)
Cardiology Office Note  Date: 05/11/2020   ID: Melvin Ray, DOB November 02, 1985, MRN [id number]  PCP:  Waldon Reining, MD  Cardiologist:  Dina Rich, MD Electrophysiologist:  None   Chief Complaint: Follow-up hypertension, tobacco abuse, OSA, morbid obesity.  History of Present Illness: Melvin Ray is a 35 y.o. male with a history of HTN, DM2, OSA, Tobacco abuse, morbid obesity.  Patient recently saw Dr. Duke Salvia at hypertension clinic.  He had not been taking his Lasix regularly because it made him go to the bathroom too often.  He was started on hydrochlorothiazide 25 mg daily.  He was struggling with super morbid obesity which was described as likely the underlying cause of his hypertension.  He was continuing clonidine, diltiazem, hydralazine, losartan, metoprolol.  She mentioned in the future switching to a more effective beta-blocker such as carvedilol.  She mentioned hydralazine could also be titrated.  She mentioned later amlodipine could be started instead of or in addition to diltiazem Dr. Duke Salvia ordered patient a new CPAP machine.  She ordered a split-night sleep study and basic metabolic panel   He is here for follow-up today blood pressure remains elevated but appears to be improved some from last recorded blood pressure on 04/25/2020 when it was 178/126.  He is going to continue following with Dr. Duke Salvia at hypertension clinic.  Blood pressure today is 158/100.  He states he has cut down on smoking.  He is working on getting his CPAP machine.  States he recently had a sleep study and only slept for about 30 minutes and is unsure if there was adequate information to make a decision on CPAP therapy.  He states he may have to repeat the study.  He has lost some weight since last visit in January he weighed 456 pounds.  Today he weighs 441 pounds.  He is trying to be more active.  He is here with his Child psychotherapist today.  Denies any new symptoms since prior visit.  Past Medical  History:  Diagnosis Date  . Diabetes mellitus without complication (HCC)   . Hypertension   . Morbid obesity (HCC)   . Obstructive sleep apnea   . Onychomycosis   . Tobacco abuse     No past surgical history on file.  Current Outpatient Medications  Medication Sig Dispense Refill  . ACCU-CHEK GUIDE test strip 3 (three) times daily.    . Accu-Chek Softclix Lancets lancets 3 (three) times daily.    Marland Kitchen albuterol (VENTOLIN HFA) 108 (90 Base) MCG/ACT inhaler Inhale into the lungs every 6 (six) hours as needed for wheezing or shortness of breath.    . chlorthalidone (HYGROTON) 25 MG tablet Take 1 tablet (25 mg total) by mouth daily. 30 tablet 1  . cloNIDine (CATAPRES) 0.1 MG tablet Take 0.1 mg by mouth 2 (two) times daily.    Marland Kitchen diltiazem (CARDIZEM CD) 240 MG 24 hr capsule Take 240 mg by mouth daily.    . empagliflozin (JARDIANCE) 10 MG TABS tablet Take 10 mg by mouth daily.    Marland Kitchen FLUoxetine (PROZAC) 20 MG tablet Take 20 mg by mouth daily.    . Fluticasone-Salmeterol (ADVAIR) 250-50 MCG/DOSE AEPB Inhale 1 puff into the lungs daily.    . furosemide (LASIX) 20 MG tablet Take 20 mg by mouth as needed (swelling).    . hydrALAZINE (APRESOLINE) 25 MG tablet Take 1 tablet (25 mg total) by mouth 3 (three) times daily. 270 tablet 3  . ibuprofen (ADVIL) 400 MG tablet Take  400 mg by mouth 3 (three) times daily.    Marland Kitchen LANTUS SOLOSTAR 100 UNIT/ML Solostar Pen Inject 20 Units into the skin at bedtime. Only takes if blood sugar greater than 150    . losartan (COZAAR) 100 MG tablet Take 100 mg by mouth daily.    . metoprolol (TOPROL-XL) 200 MG 24 hr tablet Take 200 mg by mouth daily.    . pantoprazole (PROTONIX) 40 MG tablet Take 40 mg by mouth daily.    . potassium chloride (KLOR-CON) 10 MEQ tablet Take 10 mEq by mouth daily.    . traZODone (DESYREL) 100 MG tablet Take 200 mg by mouth at bedtime.      No current facility-administered medications for this visit.   Allergies:  Patient has no known allergies.    Social History: The patient  reports that he has been smoking cigars. He has quit using smokeless tobacco. He reports that he does not drink alcohol and does not use drugs.   Family History: The patient's family history includes Diabetes in his maternal grandmother; Heart failure in his father and paternal grandmother.   ROS:  Please see the history of present illness. Otherwise, complete review of systems is positive for none.  All other systems are reviewed and negative.   Physical Exam: VS:  BP (!) 158/100   Pulse 65   Ht 6' (1.829 m)   Wt (!) 441 lb 9.6 oz (200.3 kg)   SpO2 95%   BMI 59.89 kg/m , BMI Body mass index is 59.89 kg/m.  Wt Readings from Last 3 Encounters:  05/11/20 (!) 441 lb 9.6 oz (200.3 kg)  03/21/20 (!) 456 lb (206.8 kg)  02/07/20 (!) 452 lb (205 kg)    General: Super morbidly obese patient appears comfortable at rest. Neck: Supple, no elevated JVP or carotid bruits, no thyromegaly. Lungs: Clear to auscultation, nonlabored breathing at rest. Cardiac: Regular rate and rhythm, no S3 or significant systolic murmur, no pericardial rub. Extremities: No pitting edema, distal pulses 2+. Skin: Warm and dry. Musculoskeletal: No kyphosis. Neuropsychiatric: Alert and oriented x3, affect grossly appropriate.  ECG:    Recent Labwork: No results found for requested labs within last 8760 hours.  No results found for: CHOL, TRIG, HDL, CHOLHDL, VLDL, LDLCALC, LDLDIRECT  Other Studies Reviewed Today: Echocardiogram 12/28/2019  1. Left ventricular ejection fraction, by estimation, is 60 to 65%. The left ventricle has normal function. The left ventricle has no regional wall motion abnormalities. The left ventricular internal cavity size was mildly dilated. There is mild left ventricular hypertrophy. Left ventricular diastolic parameters are indeterminate. 2. Right ventricular systolic function is normal. The right ventricular size is normal. Tricuspid regurgitation  signal is inadequate for assessing PA pressure. 3. The mitral valve is grossly normal. Trivial mitral valve regurgitation. 4. The aortic valve is tricuspid. Aortic valve regurgitation is not visualized. 5. The inferior vena cava is dilated in size with >50% respiratory variability, suggesting right atrial pressure of 8 mmHg.  Assessment and Plan:  1. Essential hypertension   2. OSA (obstructive sleep apnea)   3. Tobacco abuse   4. Morbid (severe) obesity due to excess calories (HCC)    1. Essential hypertension Recently seen by hypertension clinic.  Blood pressure has improved some since that visit.  Current antihypertensive medications include chlorthalidone 25 mg p.o. daily.  Clonidine 0.1 mg p.o. twice daily.  Diltiazem 240 mg p.o. daily.  Hydralazine 25 mg p.o. 3 times daily.  Losartan 100 mg p.o. daily.  Toprol-XL  200 mg daily.  Blood pressure today is 158/100 which is an improvement since last check.  Please increase hydralazine to 50 mg p.o. 3 times daily.  Keep follow-up visit on Thursday, March 31 in [place] with hypertension clinic.  2. OSA (obstructive sleep apnea) Patient states he recently had a sleep study but only slept around 30 minutes and is unsure if there was adequate information to determined CPAP therapy.  States he may have to repeat the study.  Dr. Duke Salvia was working on getting him a new CPAP machine.  3. Tobacco abuse He continues to smoke but states he is cut down on the amount of smoking.  Advised to continue to try to taper smoking and eventually stop.  4. Morbid (severe) obesity due to excess calories (HCC) Severe morbid obesity with a BMI of 59.89.  He has recently lost some weight.  Previous weight was 456.  Today's weight is 451 pounds.  States he is trying to lose weight and eat better.  Advised increasing activity and dietary modifications.  Medication Adjustments/Labs and Tests Ordered: Current medicines are reviewed at length with the patient today.   Concerns regarding medicines are outlined above.   Disposition: Follow-up with [facility] or APP 6 months  Signed, Rennis Harding, NP 05/11/2020 3:37 PM    [facility] at [facility] [address] [place], [place], [place] [address] Phone: [phone]; Fax: [phone]

## 2020-05-14 ENCOUNTER — Other Ambulatory Visit: Payer: Self-pay | Admitting: Cardiovascular Disease

## 2020-05-14 ENCOUNTER — Telehealth: Payer: Self-pay | Admitting: *Deleted

## 2020-05-14 DIAGNOSIS — G4733 Obstructive sleep apnea (adult) (pediatric): Secondary | ICD-10-CM

## 2020-05-14 DIAGNOSIS — G4736 Sleep related hypoventilation in conditions classified elsewhere: Secondary | ICD-10-CM

## 2020-05-14 NOTE — Telephone Encounter (Signed)
Patient called in and was given sleep study results and recommendations. CPAP titration appointment given to patient.

## 2020-05-14 NOTE — Telephone Encounter (Signed)
Attempted to contact patient to give sleep study results. Voicemail has not been set up.

## 2020-05-15 ENCOUNTER — Telehealth: Payer: Self-pay

## 2020-05-15 ENCOUNTER — Other Ambulatory Visit: Payer: Self-pay | Admitting: Cardiovascular Disease

## 2020-05-15 NOTE — Telephone Encounter (Signed)
UNABLE TO LMOM FOR LABS PRIOR TO APPT

## 2020-05-24 ENCOUNTER — Ambulatory Visit: Payer: Medicaid Other

## 2020-05-24 NOTE — Progress Notes (Deleted)
Patient ID: Melvin Ray                 DOB: 24-Mar-1985                      MRN: [id number]     HPI: Melvin Ray is a 35 y.o. male referred by Dr. Duke Salvia  to HTN clinic. PMH include diabetes, OSA on CPAP, tobacco abuse, obesity, hypertension, low extremity edema, and shortness of breath. Patient reports compliance with all medication. He also report hx of A.Fib diagnosed for 1st time during hospital admission in 2020. He comes to this appointment accompany by social worker from [facility]. Social worker assist with transportation to laboratory, medical appointment, and grocery shopping.  All medications are delivered by his pharmacy to his home.  Current HTN meds:  Clonidine 0.1mg  twice daily Diltiazem CD 240mg  daily Furosemide 20mg  daily  Hydralazine 50mg  TID (wake up- 4hrs-bedtime) Chlorthalidone 25mg  daily Losartan 100mg  daily (Irbesartan or Candesartan?) Metoprolol succinate 200mg  daily  Previously tried:  Lisinopril  Labetalol amlodipine  BP goal: 130/80  Family History:  includes Diabetes in his maternal grandmother; Heart failure in his father and paternal grandmother  Social History: denies alcohol use, occasional cigar smoker?  Diet: "poor", high sodium, unable to get groceries by himself. Orders take-out ~ 3 times per week via . Social worker with will take him to grocery store today and will try to help with food acquisition.  Exercise: sedentary. activities of daily living  Home BP readings: none provided today  Wt Readings from Last 3 Encounters:  05/11/20 (!) 441 lb 9.6 oz (200.3 kg)  03/21/20 (!) 456 lb (206.8 kg)  02/07/20 (!) 452 lb (205 kg)   BP Readings from Last 3 Encounters:  05/11/20 (!) 158/100  04/25/20 (!) 178/126  03/21/20 (!) 166/102   Pulse Readings from Last 3 Encounters:  05/11/20 65  04/25/20 76  03/21/20 75    Past Medical History:  Diagnosis Date  . Diabetes mellitus without complication (HCC)   .  Hypertension   . Morbid obesity (HCC)   . Obstructive sleep apnea   . Onychomycosis   . Tobacco abuse     Current Outpatient Medications on File Prior to Visit  Medication Sig Dispense Refill  . ACCU-CHEK GUIDE test strip 3 (three) times daily.    . Accu-Chek Softclix Lancets lancets 3 (three) times daily.    05/13/20 albuterol (VENTOLIN HFA) 108 (90 Base) MCG/ACT inhaler Inhale into the lungs every 6 (six) hours as needed for wheezing or shortness of breath.    . chlorthalidone (HYGROTON) 25 MG tablet Take 1 tablet (25 mg total) by mouth daily. 30 tablet 1  . cloNIDine (CATAPRES) 0.1 MG tablet Take 0.1 mg by mouth 2 (two) times daily.    06/25/20 diltiazem (CARDIZEM CD) 240 MG 24 hr capsule Take 240 mg by mouth daily.    . empagliflozin (JARDIANCE) 10 MG TABS tablet Take 10 mg by mouth daily.    03/23/20 FLUoxetine (PROZAC) 20 MG tablet Take 20 mg by mouth daily.    . Fluticasone-Salmeterol (ADVAIR) 250-50 MCG/DOSE AEPB Inhale 1 puff into the lungs daily.    . furosemide (LASIX) 20 MG tablet Take 20 mg by mouth as needed (swelling).    . hydrALAZINE (APRESOLINE) 50 MG tablet Take 1 tablet (50 mg total) by mouth 3 (three) times daily. 270 tablet 3  . ibuprofen (ADVIL) 400 MG tablet Take 400 mg by mouth 3 (  three) times daily.    Marland Kitchen LANTUS SOLOSTAR 100 UNIT/ML Solostar Pen Inject 20 Units into the skin at bedtime. Only takes if blood sugar greater than 150    . losartan (COZAAR) 100 MG tablet Take 100 mg by mouth daily.    . metoprolol (TOPROL-XL) 200 MG 24 hr tablet Take 200 mg by mouth daily.    . pantoprazole (PROTONIX) 40 MG tablet Take 40 mg by mouth daily.    . potassium chloride (KLOR-CON) 10 MEQ tablet Take 10 mEq by mouth daily.    . traZODone (DESYREL) 100 MG tablet Take 200 mg by mouth at bedtime.      No current facility-administered medications on file prior to visit.    No Known Allergies  There were no vitals taken for this visit.  No problem-specific Assessment & Plan notes found for this  encounter.    Albaro Deviney Rodriguez-Guzman PharmD, BCPS, CPP [facility] [address] [place] [address] 05/24/2020 8:59 AM

## 2020-05-28 ENCOUNTER — Encounter: Payer: Medicaid Other | Admitting: Cardiovascular Disease

## 2020-06-07 ENCOUNTER — Other Ambulatory Visit: Payer: Self-pay | Admitting: Cardiovascular Disease

## 2020-06-13 ENCOUNTER — Other Ambulatory Visit: Payer: Self-pay | Admitting: Cardiology

## 2020-06-21 ENCOUNTER — Ambulatory Visit (INDEPENDENT_AMBULATORY_CARE_PROVIDER_SITE_OTHER): Payer: Medicaid Other

## 2020-06-21 ENCOUNTER — Other Ambulatory Visit: Payer: Self-pay

## 2020-06-21 ENCOUNTER — Ambulatory Visit (INDEPENDENT_AMBULATORY_CARE_PROVIDER_SITE_OTHER): Payer: Medicaid Other | Admitting: Pharmacist Clinician (PhC)/ Clinical Pharmacy Specialist

## 2020-06-21 DIAGNOSIS — Z0000 Encounter for general adult medical examination without abnormal findings: Secondary | ICD-10-CM

## 2020-06-21 DIAGNOSIS — I10 Essential (primary) hypertension: Secondary | ICD-10-CM

## 2020-06-21 LAB — HEMOGLOBIN A1C: Hemoglobin A1C: 13.5

## 2020-06-21 MED ORDER — VALSARTAN 320 MG PO TABS: 320.0000 mg | ORAL_TABLET | Freq: Every day | ORAL | 12 refills | Status: DC

## 2020-06-21 NOTE — Patient Instructions (Signed)
Return for a a follow up appointment June 7 at 2 pm    Check your blood pressure at home daily and keep record of the readings.  Take your BP meds as follows:  Continue with your current medications.    With your next pharmacy delivery we will switch the losartan 100 mg to valsartan 320 mg once daily.  Bring all of your meds, your BP cuff and your record of home blood pressures to your next appointment.  Exercise as you're able, try to walk approximately 30 minutes per day.  Keep salt intake to a minimum, especially watch canned and prepared boxed foods.  Eat more fresh fruits and vegetables and fewer canned items.  Avoid eating in fast food restaurants.    HOW TO TAKE YOUR BLOOD PRESSURE: . Rest 5 minutes before taking your blood pressure. .  Don't smoke or drink caffeinated beverages for at least 30 minutes before. . Take your blood pressure before (not after) you eat. . Sit comfortably with your back supported and both feet on the floor (don't cross your legs). . Elevate your arm to heart level on a table or a desk. . Use the proper sized cuff. It should fit smoothly and snugly around your bare upper arm. There should be enough room to slip a fingertip under the cuff. The bottom edge of the cuff should be 1 inch above the crease of the elbow. . Ideally, take 3 measurements at one sitting and record the average.

## 2020-06-21 NOTE — Progress Notes (Signed)
Appointment Outcome:  Completed, Session #: Initial health coaching session  Start time: 4:00pm   End time: 4:45pm   Total Mins: 45 minutes  AGREEMENTS SECTION   Overall Goal(s): Improve healthy eating behaviors Increase physical activity                                               Agreement/Action Steps:  Improve health eating behaviors Follow DASH diet  Read food labels Track sodium with log Don't cook with salt  Increase physical activity Walk 2-3 days per week (30-45 minutes = about 1 mile) Perform chair exercises 10 minutes per day   Progress Notes:  Patient wants to improve his health overall. Patient decided that he is most interested in losing weight by increasing his physical activity and watching his sodium intake. Patient stated that he has stopped cooking with salt and cook with garlic and onion powders Cajun seasoning and pepper. Patient reported that he just went grocery shopping and picked up some water, oxtails, neck bones, foods to make a salad, and microwave meals. Patient states that he will prepare the oxtails and neck bones on weekends and cook with no salt.   Patient stated that during his last visit with Dr. Duke Salvia he was informed to follow the DASH diet. Patient mentioned that he could not remember exactly how much sodium he was recommended to consume daily, but think it was 2000mg .   Patient stated that he would like to start walking outside for a least a mile each time. Patient stated that he is interested in going to the gym for an exercise program. Patient is familiar with some chair exercises that he can do in the meantime.    Coaching Outcomes: Patient wants to start walking 2-3 days a week for at least a mile each time. This perhaps may equal to 30-45 minutes of walking for patient. Patient will perform some chair exercises for 10 minutes each day. Care Guide will reach out to Dr. to submit a referral for PREP.  Patient will start reading  food labels to watch for sodium. Patient will continue to cook with no salt, purchase low-sodium or no salt added foods. Patient start tracking sodium consumption with log from the American Heart Association. Patient wants to aim for 1500-2000mg  of sodium per day. Patient will follow Dash Diet as recommended.   Patient was mailed health coaching agreement to sign and return. Patient was mailed sodium tracking sheets as well.   Patient stated that pursuing these goals will help him achieve freedom and is causing him to move out of his comfort zone.

## 2020-06-21 NOTE — Progress Notes (Signed)
06/21/2020 Tracker Mance [birth year] [id number]   HPI:  Melvin Ray is a 35 y.o. male patient of Chilton Si, MD with a PMH of T2DM, hypertension, morbid obesity, obstructive sleep apnea who presents today for hypertension clinic evaluation. Patient arrived at office visit with social worker who provides transportation for office and grocery store visits.  At the last office visit on 05/11/20 with Juanetta Gosling, PA blood pressure was 158/100 which had improved since last PharmD clinic visit on 04/25/2020 where HCTZ 25 mg daily was changed to Chlorthiadone 25 mg daily. Blood pressure was still above goal of  <130/80 mm Hg per ACC/AHA guidelines. Hydralazine was increased from 25 mg to 50 mg 3 times daily.   Pt receives medication delivery from pharmacy and all medications are in a pill pack except for inhalers. Pt bought home medication to office visit and hydralazine 25 mg 3 times daily was in the pill pack instead of 50 mg 3 times daily.  Pt reported feeling lightheaded within last 2 weeks. He is unsure if it is medication or due to high blood sugar. He checks his blood glucose daily, last reading was in 400s.    Blood Pressure Goal:  <130/80 per ACC/AHA guidelines  Current Medications: chlorthalidone 25 mg daily, clonidine 0.1 mg twice daily, diltiazem 240 mg daily, furosemide 20 mg tablet daily, hydralazine 25 mg 3 times daily, losartan 100 mg daily, metoprolol succinate 200 mg daily   Family Hx: father - heart failure, maternal grandmother - diabetes, paternal grandfather - heart failure  Social Hx: smokes cigars 1-2 times weekly  Diet: drinks water and diet pepsi.  Pt appetite has been decreasing the past couple of weeks which has decreased overall portion sizes during meals. Pt typically eats 2 meals daily. Pt wakes up around 2-3 pm daily and has first meal along with AM scheduled medications and eats 2nd meal late at night or around 3-4 am. Pt has been attempting to modify food  choices by eating more salads, fruits such as apples and cantaloupe, and vegetables such as brussel sprouts. Pt elected to speak with Renaee Munda during office visit for support on lifestyle modifications.   Exercise: none  Home BP readings: not monitoring daily as he was instructed at last office visit. Pt states he checks blood pressure 2-3 times weekly. Pt could not recall what average blood pressure readings were at home.  Intolerances: none at this time  Labs:  03/30/20 - Na - 142, K - 3.5, BUN - 12, Scr - 1.12, GFR - >90, Glu - 141  Wt Readings from Last 3 Encounters:  05/11/20 (!) 441 lb 9.6 oz (200.3 kg)  03/21/20 (!) 456 lb (206.8 kg)  02/07/20 (!) 452 lb (205 kg)   BP Readings from Last 3 Encounters:  06/21/20 132/84  05/11/20 (!) 158/100  04/25/20 (!) 178/126   Pulse Readings from Last 3 Encounters:  06/21/20 81  05/11/20 65  04/25/20 76    Current Outpatient Medications  Medication Sig Dispense Refill  . ACCU-CHEK GUIDE test strip 3 (three) times daily.    . Accu-Chek Softclix Lancets lancets 3 (three) times daily.    Marland Kitchen albuterol (VENTOLIN HFA) 108 (90 Base) MCG/ACT inhaler Inhale into the lungs every 6 (six) hours as needed for wheezing or shortness of breath.    . chlorthalidone (HYGROTON) 25 MG tablet TAKE 1 TABLET ONCE DAILY. 90 tablet 1  . cloNIDine (CATAPRES) 0.1 MG tablet Take 0.1 mg by mouth 2 (two) times  daily.    . diltiazem (CARDIZEM CD) 240 MG 24 hr capsule Take 240 mg by mouth daily.    . empagliflozin (JARDIANCE) 10 MG TABS tablet Take 10 mg by mouth daily.    Marland Kitchen FLUoxetine (PROZAC) 20 MG tablet Take 20 mg by mouth daily.    . Fluticasone-Salmeterol (ADVAIR) 250-50 MCG/DOSE AEPB Inhale 1 puff into the lungs daily.    . furosemide (LASIX) 20 MG tablet TAKE 1 TABLET ONCE DAILY. 90 tablet 1  . hydrALAZINE (APRESOLINE) 50 MG tablet Take 1 tablet (50 mg total) by mouth 3 (three) times daily. 270 tablet 3  . ibuprofen (ADVIL) 400 MG tablet Take 400 mg by mouth 3  (three) times daily.    Marland Kitchen LANTUS SOLOSTAR 100 UNIT/ML Solostar Pen Inject 20 Units into the skin at bedtime. Only takes if blood sugar greater than 150    . metoprolol (TOPROL-XL) 200 MG 24 hr tablet Take 200 mg by mouth daily.    . pantoprazole (PROTONIX) 40 MG tablet Take 40 mg by mouth daily.    . potassium chloride (KLOR-CON) 10 MEQ tablet Take 10 mEq by mouth daily.    . traZODone (DESYREL) 100 MG tablet Take 200 mg by mouth at bedtime.     . valsartan (DIOVAN) 320 MG tablet Take 1 tablet (320 mg total) by mouth daily. 30 tablet 12   No current facility-administered medications for this visit.    No Known Allergies  Past Medical History:  Diagnosis Date  . Diabetes mellitus without complication (HCC)   . Hypertension   . Morbid obesity (HCC)   . Obstructive sleep apnea   . Onychomycosis   . Tobacco abuse     Blood pressure 132/84, pulse 81, resp. rate 17, height 6' (1.829 m), SpO2 90 %.  Hypertension Today's blood pressure of 132/85 shows a blood pressure near goal. Per ACC/AHA guidelines, the patient's goal blood pressure should be <130/80. Pt has resistant hypertension and is currently being managed with chlorthalidone 25 mg daily, clonidine 0.1 mg twice daily, diltiazem 240 mg daily, furosemide 20 mg tablet daily, hydralazine 25 mg 3 times daily, losartan 100 mg daily, and metoprolol succinate 200 mg daily   Pt was counseled on the side effects of medications and indicated understanding of symptoms  Losartan 100 mg will be discontinued once pt finishes current tablets. Initiate Valsartan 320 mg daily once finished with losartan.  Pt was encouraged to monitor blood pressure daily and bring in blood pressure log to next office visit.  Follow-up visit and repeat labs in 6 weeks.  Rhina Brackett, PharmD candidate 2022 [employer]  I was with student and patient throughout visit and agree with above plan.   Phillips Hay PharmD CPP  [facility] [address] [place], [place] [address] [phone]

## 2020-06-21 NOTE — Assessment & Plan Note (Addendum)
Today's blood pressure of 132/85 shows a blood pressure near goal. Per ACC/AHA guidelines, the patient's goal blood pressure should be <130/80. Pt has resistant hypertension and is currently being managed with chlorthalidone 25 mg daily, clonidine 0.1 mg twice daily, diltiazem 240 mg daily, furosemide 20 mg tablet daily, hydralazine 25 mg 3 times daily, losartan 100 mg daily, and metoprolol succinate 200 mg daily   Pt was counseled on the side effects of medications and indicated understanding of symptoms  Losartan 100 mg will be discontinued once pt finishes current tablets. Initiate Valsartan 320 mg daily once finished with losartan.  Pt was encouraged to monitor blood pressure daily and bring in blood pressure log to next office visit.  Follow-up visit and repeat labs in 6 weeks.

## 2020-06-22 ENCOUNTER — Telehealth: Payer: Self-pay

## 2020-06-22 NOTE — Telephone Encounter (Signed)
Received referral for PREP from HTN clinic. Attempted to call pt, mailbox not set up.  Text sent requesting call back to discuss.

## 2020-06-25 ENCOUNTER — Telehealth: Payer: Self-pay

## 2020-06-25 NOTE — Telephone Encounter (Signed)
Pt left [facility] returning call reference PREP Called pt back Explained PREP to pt Is interested but needs help with transport Would like to do evenings however transport will be an issue Explained could help with transport for an afternoon class, can do [id number] Agreeable to that time  Will call pt back when next afternoon class identified

## 2020-07-02 ENCOUNTER — Encounter: Payer: Self-pay | Admitting: "Endocrinology

## 2020-07-02 ENCOUNTER — Ambulatory Visit (INDEPENDENT_AMBULATORY_CARE_PROVIDER_SITE_OTHER): Payer: Medicaid Other | Admitting: "Endocrinology

## 2020-07-02 ENCOUNTER — Other Ambulatory Visit: Payer: Self-pay

## 2020-07-02 VITALS — BP 110/58 | HR 72 | Ht 72.0 in | Wt 395.6 lb

## 2020-07-02 DIAGNOSIS — I10 Essential (primary) hypertension: Secondary | ICD-10-CM | POA: Diagnosis not present

## 2020-07-02 DIAGNOSIS — Z794 Long term (current) use of insulin: Secondary | ICD-10-CM

## 2020-07-02 DIAGNOSIS — F17200 Nicotine dependence, unspecified, uncomplicated: Secondary | ICD-10-CM | POA: Insufficient documentation

## 2020-07-02 DIAGNOSIS — N182 Chronic kidney disease, stage 2 (mild): Secondary | ICD-10-CM

## 2020-07-02 DIAGNOSIS — Z720 Tobacco use: Secondary | ICD-10-CM | POA: Insufficient documentation

## 2020-07-02 DIAGNOSIS — E1122 Type 2 diabetes mellitus with diabetic chronic kidney disease: Secondary | ICD-10-CM

## 2020-07-02 DIAGNOSIS — E782 Mixed hyperlipidemia: Secondary | ICD-10-CM

## 2020-07-02 MED ORDER — METFORMIN HCL ER 500 MG PO TB24: 500.0000 mg | ORAL_TABLET | Freq: Every day | ORAL | 1 refills | Status: DC

## 2020-07-02 MED ORDER — LANTUS SOLOSTAR 100 UNIT/ML ~~LOC~~ SOPN: 50.0000 [IU] | PEN_INJECTOR | Freq: Every day | SUBCUTANEOUS | 2 refills | Status: DC

## 2020-07-02 NOTE — Patient Instructions (Signed)

## 2020-07-02 NOTE — Progress Notes (Signed)
Endocrinology Consult Note       07/02/2020, 9:12 PM   Subjective:    Patient ID: Melvin Ray, male    DOB: 12/23/1985.  Drake Wuertz is being seen in consultation for management of currently uncontrolled symptomatic diabetes requested by  Waldon Reining, MD.   Past Medical History:  Diagnosis Date  . Diabetes mellitus without complication (HCC)   . Hypertension   . Morbid obesity (HCC)   . Obstructive sleep apnea   . Onychomycosis   . Tobacco abuse     Past Surgical History:  Procedure Laterality Date  . APPENDECTOMY      Social History   Socioeconomic History  . Marital status: Single    Spouse name: Not on file  . Number of children: Not on file  . Years of education: Not on file  . Highest education level: Not on file  Occupational History  . Not on file  Tobacco Use  . Smoking status: Current Some Day Smoker    Types: Cigars  . Smokeless tobacco: Former Clinical biochemist  . Vaping Use: Never used  Substance and Sexual Activity  . Alcohol use: Never  . Drug use: Never  . Sexual activity: Not on file  Other Topics Concern  . Not on file  Social History Narrative  . Not on file   Social Determinants of Health   Financial Resource Strain: Low Risk   . Difficulty of Paying Living Expenses: Not very hard  Food Insecurity: No Food Insecurity  . Worried About Programme researcher, broadcasting/film/video in the Last Year: Never true  . Ran Out of Food in the Last Year: Never true  Transportation Needs: Unmet Transportation Needs  . Lack of Transportation (Medical): Yes  . Lack of Transportation (Non-Medical): Yes  Physical Activity: Inactive  . Days of Exercise per Week: 0 days  . Minutes of Exercise per Session: 0 min  Stress: Not on file  Social Connections: Not on file    Family History  Problem Relation Age of Onset  . Heart failure Father   . Hypertension Father   . Diabetes Maternal  Grandmother   . Heart failure Paternal Grandmother        transplant    Outpatient Encounter Medications as of 07/02/2020  Medication Sig  . metFORMIN (GLUCOPHAGE XR) 500 MG 24 hr tablet Take 1 tablet (500 mg total) by mouth daily with breakfast.  . [DISCONTINUED] GUAIFENESIN PO Take 1 tablet by mouth every 4 (four) hours as needed.  Marland Kitchen ACCU-CHEK GUIDE test strip 3 (three) times daily.  . Accu-Chek Softclix Lancets lancets 3 (three) times daily.  Marland Kitchen albuterol (VENTOLIN HFA) 108 (90 Base) MCG/ACT inhaler Inhale into the lungs every 6 (six) hours as needed for wheezing or shortness of breath.  . chlorthalidone (HYGROTON) 25 MG tablet TAKE 1 TABLET ONCE DAILY.  . cloNIDine (CATAPRES) 0.1 MG tablet Take 0.1 mg by mouth 2 (two) times daily.  Marland Kitchen diltiazem (CARDIZEM CD) 240 MG 24 hr capsule Take 240 mg by mouth daily.  . empagliflozin (JARDIANCE) 10 MG TABS tablet Take 10 mg by mouth daily.  Marland Kitchen FLUoxetine (  PROZAC) 20 MG tablet Take 20 mg by mouth daily.  . Fluticasone-Salmeterol (ADVAIR) 250-50 MCG/DOSE AEPB Inhale 1 puff into the lungs daily.  . furosemide (LASIX) 20 MG tablet TAKE 1 TABLET ONCE DAILY.  . hydrALAZINE (APRESOLINE) 50 MG tablet Take 1 tablet (50 mg total) by mouth 3 (three) times daily.  Marland Kitchen ibuprofen (ADVIL) 400 MG tablet Take 400 mg by mouth 3 (three) times daily.  Marland Kitchen LANTUS SOLOSTAR 100 UNIT/ML Solostar Pen Inject 50 Units into the skin at bedtime.  Marland Kitchen losartan (COZAAR) 100 MG tablet Take 1 tablet by mouth daily.  . metoprolol (TOPROL-XL) 200 MG 24 hr tablet Take 200 mg by mouth daily.  . pantoprazole (PROTONIX) 40 MG tablet Take 40 mg by mouth daily.  . potassium chloride (KLOR-CON) 10 MEQ tablet Take 10 mEq by mouth daily.  . traZODone (DESYREL) 100 MG tablet Take 200 mg by mouth at bedtime.   . [DISCONTINUED] amLODipine (NORVASC) 10 MG tablet Take 1 tablet by mouth daily.  . [DISCONTINUED] LANTUS SOLOSTAR 100 UNIT/ML Solostar Pen Inject 30 Units into the skin at bedtime. Only takes  if blood sugar greater than 150  . [DISCONTINUED] valsartan (DIOVAN) 320 MG tablet Take 1 tablet (320 mg total) by mouth daily.   No facility-administered encounter medications on file as of 07/02/2020.    ALLERGIES: No Known Allergies  VACCINATION STATUS:  There is no immunization history on file for this patient.  Diabetes He presents for his initial diabetic visit. He has type 2 diabetes mellitus. Onset time: He was diagnosed through approximate age of 31 years. His disease course has been worsening. There are no hypoglycemic associated symptoms. Pertinent negatives for hypoglycemia include no headaches, seizures or tremors. Associated symptoms include blurred vision, polydipsia, polyuria and weight loss. Pertinent negatives for diabetes include no chest pain. There are no hypoglycemic complications. Symptoms are worsening. Diabetic complications include nephropathy. Risk factors for coronary artery disease include dyslipidemia, diabetes mellitus, family history, male sex, obesity, hypertension, tobacco exposure and sedentary lifestyle. Current diabetic treatments: He is currently on Lantus 30 units nightly, and Jardiance 10 mg p.o. daily. His weight is fluctuating minimally. He is following a generally unhealthy diet. When asked about meal planning, he reported none. He has not had a previous visit with a dietitian. He never participates in exercise. His home blood glucose trend is increasing steadily. His overall blood glucose range is >200 mg/dl. (He brought a meter showing 12 readings in the last 30 days averaging 483.  His most recent A1c was 13.5%.) An ACE inhibitor/angiotensin II receptor blocker is being taken. He does not see a podiatrist.Eye exam is not current.  Hyperlipidemia This is a chronic problem. The current episode started more than 1 year ago. Exacerbating diseases include chronic renal disease, diabetes and obesity. Pertinent negatives include no chest pain, myalgias or  shortness of breath. Risk factors for coronary artery disease include dyslipidemia, diabetes mellitus, family history, obesity, male sex, hypertension and a sedentary lifestyle.  Hypertension This is a chronic problem. The current episode started more than 1 year ago. Associated symptoms include blurred vision. Pertinent negatives include no chest pain, headaches, palpitations or shortness of breath. Risk factors for coronary artery disease include dyslipidemia, diabetes mellitus, family history, obesity, male gender, sedentary lifestyle and smoking/tobacco exposure. Past treatments include central alpha agonists and angiotensin blockers. Identifiable causes of hypertension include chronic renal disease.     Review of Systems  Constitutional: Positive for weight loss. Negative for chills and fever.  Eyes:  Positive for blurred vision.  Respiratory: Negative for cough and shortness of breath.   Cardiovascular: Negative for chest pain and palpitations.       No Shortness of breath  Gastrointestinal: Negative for abdominal pain, diarrhea, nausea and vomiting.  Endocrine: Positive for polydipsia and polyuria.  Genitourinary: Negative for frequency, hematuria and urgency.  Musculoskeletal: Negative for myalgias.  Skin: Negative for rash.  Neurological: Negative for tremors, seizures and headaches.  Hematological: Does not bruise/bleed easily.  Psychiatric/Behavioral: Negative for hallucinations and suicidal ideas.    Objective:    Vitals with BMI 07/02/2020 06/21/2020 05/11/2020  Height     Weight 395 lbs 10 oz - 441 lbs 10 oz  BMI 53.64 - 59.88  Systolic 110 132 161  Diastolic 58 84 100  Pulse 72 81 65    BP (!) 110/58   Pulse 72   Ht 6' (1.829 m)   Wt (!) 395 lb 9.6 oz (179.4 kg)   BMI 53.65 kg/m   Wt Readings from Last 3 Encounters:  07/02/20 (!) 395 lb 9.6 oz (179.4 kg)  05/11/20 (!) 441 lb 9.6 oz (200.3 kg)  03/21/20 (!) 456 lb (206.8 kg)     Physical  Exam Constitutional:      General: He is not in acute distress.    Appearance: He is well-developed.  HENT:     Head: Normocephalic and atraumatic.  Neck:     Thyroid: No thyromegaly.     Trachea: No tracheal deviation.  Cardiovascular:     Rate and Rhythm: Normal rate.     Pulses:          Dorsalis pedis pulses are 1+ on the right side and 1+ on the left side.       Posterior tibial pulses are 1+ on the right side and 1+ on the left side.     Heart sounds: S1 normal and S2 normal. No murmur heard. No gallop.   Pulmonary:     Effort: Pulmonary effort is normal. No respiratory distress.     Breath sounds: No wheezing.  Abdominal:     General: Bowel sounds are normal. There is no distension.     Palpations: Abdomen is soft.     Tenderness: There is no abdominal tenderness. There is no guarding.  Musculoskeletal:     Right shoulder: No swelling or deformity.     Cervical back: Normal range of motion and neck supple.  Skin:    General: Skin is warm and dry.     Findings: No rash.     Nails: There is no clubbing.  Neurological:     Mental Status: He is alert and oriented to person, place, and time.     Cranial Nerves: No cranial nerve deficit.     Sensory: No sensory deficit.     Gait: Gait normal.     Deep Tendon Reflexes: Reflexes are normal and symmetric.  Psychiatric:        Speech: Speech normal.        Behavior: Behavior normal. Behavior is cooperative.        Thought Content: Thought content normal.        Judgment: Judgment normal.     Diabetic Labs (most recent): Lab Results  Component Value Date   HGBA1C 13.5 06/21/2020        Assessment & Plan:   1. Type 2 diabetes mellitus with stage 2 chronic kidney disease, with long-term current use of insulin (HCC)   -  Micah NoelJames Mitch has currently uncontrolled symptomatic type 2 DM since  35 years of age,  with most recent A1c of 13.5 %. Recent labs reviewed. - I had a long discussion with him about the progressive  nature of diabetes and the pathology behind its complications. -his diabetes is complicated by CKD, obesity/sedentary life, smoking and he remains at a high risk for more acute and chronic complications which include CAD, CVA, CKD, retinopathy, and neuropathy. These are all discussed in detail with him.  - I have counseled him on diet  and weight management  by adopting a carbohydrate restricted/protein rich diet. Patient is encouraged to switch to  unprocessed or minimally processed     complex starch and increased protein intake (animal or plant source), fruits, and vegetables. -  he is advised to stick to a routine mealtimes to eat 3 meals  a day and avoid unnecessary snacks ( to snack only to correct hypoglycemia).   - he acknowledges that there is a room for improvement in his food and drink choices. - Suggestion is made for him to avoid simple carbohydrates  from his diet including Cakes, Sweet Desserts, Ice Cream, Soda (diet and regular), Sweet Tea, Candies, Chips, Cookies, Store Bought Juices, Alcohol in Excess of  1-2 drinks a day, Artificial Sweeteners,  Coffee Creamer, and "Sugar-free" Products. This will help patient to have more stable blood glucose profile and potentially avoid unintended weight gain.  - he will be scheduled with Norm SaltPenny Crumpton, RDN, CDE for diabetes education.  - I have approached him with the following individualized plan to manage  his diabetes and patient agrees:   -In light of his chronic glycemic burden, he will continue to need insulin treatment in order for him to achieve control of diabetes to target. -In preparation, he is approached to start monitoring blood glucose 4 times a day-before meals and at bedtime and return in 1 week with his meter and logs for evaluation. -In the meantime, he is advised to increase Lantus to 50 units nightly, will be considered for prandial insulin if he presents with appropriate engagement to monitor and significant postprandial  hyperglycemia. - he is warned not to take insulin without proper monitoring per orders. - Adjustment parameters are given to him for hypo and hyperglycemia in writing. - he is encouraged to call clinic for blood glucose levels less than 70 or above 300 mg /dl. -I discussed the potential side effects of  SGLT2 inhibitors, he is already on Jardiance.  I have advised him to finish his current supplies of Jardiance 10 mg p.o. daily at breakfast. -He has CKD stage 2, which will not contraindicate use of low-dose metformin.  I discussed and added metformin 500 mg XR p.o. daily after breakfast.   -His constant for smoking cessation. The patient was counseled on the dangers of tobacco use, and was advised to quit.  Reviewed strategies to maximize success, including removing cigarettes and smoking materials from environment.   - he will be considered for incretin therapy as appropriate next visit.  - Specific targets for  A1c;  LDL, HDL,  and Triglycerides were discussed with the patient.  2) Blood Pressure /Hypertension:  his blood pressure is  controlled to target.   he is advised to continue his current medications including chlorthalidone 25 mg p.o. daily, clonidine 0.1 mg p.o. twice daily, hydralazine 50 mg p.o. 3 times daily, losartan 100 mg p.o. every morning, metoprolol 200 mg p.o. daily.   Admittedly, he consumes salty  food, advised to limit salt consumption.   3) Lipids/Hyperlipidemia: He does not have recent lipid panel to review.  He would be considered for statin intervention next visit.     4)  Weight/Diet:  Body mass index is 53.65 kg/m.  -   clearly complicating his diabetes care.   he is  a candidate for weight loss. I discussed with him the fact that loss of 5 - 10% of his  current body weight will have the most impact on his diabetes management.  Exercise, and detailed carbohydrates information provided  -  detailed on discharge instructions.  5) Chronic Care/Health  Maintenance:  -he  is on ARB medications and  is encouraged to initiate and continue to follow up with Ophthalmology, Dentist,  Podiatrist at least yearly or according to recommendations, and advised to  quit smoking. I have recommended yearly flu vaccine and pneumonia vaccine at least every 5 years; moderate intensity exercise for up to 150 minutes weekly; and  sleep for at least 7 hours a day.  - he is  advised to maintain close follow up with Waldon Reining, MD for primary care needs, as well as his other providers for optimal and coordinated care.   I spent 65 minutes in the care of the patient today including review of labs from CMP, Lipids, Thyroid Function, Hematology (current and previous including abstractions from other facilities); face-to-face time discussing  his blood glucose readings/logs, discussing hypoglycemia and hyperglycemia episodes and symptoms, medications doses, his options of short and long term treatment based on the latest standards of care / guidelines;  discussion about incorporating lifestyle medicine;  and documenting the encounter.     Please refer to Patient Instructions for Blood Glucose Monitoring and Insulin/Medications Dosing Guide"  in media tab for additional information. Please  also refer to " Patient Self Inventory" in the Media  tab for reviewed elements of pertinent patient history.  Micah Noel and his care coordinator from integrated services participated in the discussions, expressed understanding, and voiced agreement with the above plans.  All questions were answered to his satisfaction. he is encouraged to contact clinic should he have any questions or concerns prior to his return visit.   Follow up plan: - Return in about 1 week (around 07/09/2020) for F/U with Meter and Logs Only - no Labs.  Marquis Lunch, MD [employer] [facility] [address] [place], [place] [address] Phone: [phone]  Fax:  [phone]    07/02/2020, 9:12 PM  This note was partially dictated with voice recognition software. Similar sounding words can be transcribed inadequately or may not  be corrected upon review.

## 2020-07-05 ENCOUNTER — Ambulatory Visit: Payer: Medicaid Other

## 2020-07-05 ENCOUNTER — Telehealth: Payer: Self-pay

## 2020-07-05 ENCOUNTER — Other Ambulatory Visit: Payer: Self-pay

## 2020-07-05 DIAGNOSIS — Z0000 Encounter for general adult medical examination without abnormal findings: Secondary | ICD-10-CM

## 2020-07-05 NOTE — Telephone Encounter (Signed)
Called patient for health coaching session. Patient had arrived at office instead. Care Guide is out sick. Patient wants an in-person session instead. Rescheduled patient for 07/12/20 at 11:15am.

## 2020-07-05 NOTE — Telephone Encounter (Signed)
Called to discuss availability for PREP classes starting in June, spoke with his community health aide Sydell Axon; pt prefers [facility] at [facility], next class will start in mid-June. Will call back with date/times and schedule assessment visit around first of June

## 2020-07-09 ENCOUNTER — Encounter: Payer: Self-pay | Admitting: "Endocrinology

## 2020-07-09 ENCOUNTER — Ambulatory Visit (INDEPENDENT_AMBULATORY_CARE_PROVIDER_SITE_OTHER): Payer: Medicaid Other | Admitting: "Endocrinology

## 2020-07-09 VITALS — BP 122/78 | HR 83 | Ht 72.0 in | Wt 394.0 lb

## 2020-07-09 DIAGNOSIS — E782 Mixed hyperlipidemia: Secondary | ICD-10-CM | POA: Diagnosis not present

## 2020-07-09 DIAGNOSIS — I10 Essential (primary) hypertension: Secondary | ICD-10-CM | POA: Diagnosis not present

## 2020-07-09 DIAGNOSIS — N182 Chronic kidney disease, stage 2 (mild): Secondary | ICD-10-CM | POA: Diagnosis not present

## 2020-07-09 DIAGNOSIS — E1122 Type 2 diabetes mellitus with diabetic chronic kidney disease: Secondary | ICD-10-CM | POA: Diagnosis not present

## 2020-07-09 DIAGNOSIS — Z794 Long term (current) use of insulin: Secondary | ICD-10-CM

## 2020-07-09 MED ORDER — LANTUS SOLOSTAR 100 UNIT/ML ~~LOC~~ SOPN: 80.0000 [IU] | PEN_INJECTOR | Freq: Every day | SUBCUTANEOUS | 2 refills | Status: DC

## 2020-07-09 MED ORDER — GLIPIZIDE ER 5 MG PO TB24: 5.0000 mg | ORAL_TABLET | Freq: Every day | ORAL | 3 refills | Status: DC

## 2020-07-09 NOTE — Progress Notes (Signed)
07/09/2020, 8:05 PM  Endocrinology follow-up note   Subjective:    Patient ID: Melvin Ray, male    DOB: [birth year].  Jerett Odonohue is being seen in follow up after he was sen in consultation for management of currently uncontrolled symptomatic diabetes requested by  Waldon Reining, MD.   Past Medical History:  Diagnosis Date  . Diabetes mellitus without complication (HCC)   . Hypertension   . Morbid obesity (HCC)   . Obstructive sleep apnea   . Onychomycosis   . Tobacco abuse     Past Surgical History:  Procedure Laterality Date  . APPENDECTOMY      Social History   Socioeconomic History  . Marital status: Single    Spouse name: Not on file  . Number of children: Not on file  . Years of education: Not on file  . Highest education level: Not on file  Occupational History  . Not on file  Tobacco Use  . Smoking status: Current Some Day Smoker    Types: Cigars  . Smokeless tobacco: Former Clinical biochemist  . Vaping Use: Never used  Substance and Sexual Activity  . Alcohol use: Never  . Drug use: Never  . Sexual activity: Not on file  Other Topics Concern  . Not on file  Social History Narrative  . Not on file   Social Determinants of Health   Financial Resource Strain: Low Risk   . Difficulty of Paying Living Expenses: Not very hard  Food Insecurity: No Food Insecurity  . Worried About Programme researcher, broadcasting/film/video in the Last Year: Never true  . Ran Out of Food in the Last Year: Never true  Transportation Needs: Unmet Transportation Needs  . Lack of Transportation (Medical): Yes  . Lack of Transportation (Non-Medical): Yes  Physical Activity: Inactive  . Days of Exercise per Week: 0 days  . Minutes of Exercise per Session: 0 min  Stress: Not on file  Social Connections: Not on file    Family History  Problem Relation Age of Onset  . Heart failure Father   . Hypertension  Father   . Diabetes Maternal Grandmother   . Heart failure Paternal Grandmother        transplant    Outpatient Encounter Medications as of 07/09/2020  Medication Sig  . glipiZIDE (GLUCOTROL XL) 5 MG 24 hr tablet Take 1 tablet (5 mg total) by mouth daily with breakfast.  . ACCU-CHEK GUIDE test strip 3 (three) times daily.  . Accu-Chek Softclix Lancets lancets 3 (three) times daily.  Marland Kitchen albuterol (VENTOLIN HFA) 108 (90 Base) MCG/ACT inhaler Inhale into the lungs every 6 (six) hours as needed for wheezing or shortness of breath.  . chlorthalidone (HYGROTON) 25 MG tablet TAKE 1 TABLET ONCE DAILY.  . cloNIDine (CATAPRES) 0.1 MG tablet Take 0.1 mg by mouth 2 (two) times daily.  Marland Kitchen diltiazem (CARDIZEM CD) 240 MG 24 hr capsule Take 240 mg by mouth daily.  . empagliflozin (JARDIANCE) 10 MG TABS tablet Take 10 mg by mouth daily.  Marland Kitchen FLUoxetine (PROZAC) 20 MG tablet Take 20 mg by  mouth daily.  . Fluticasone-Salmeterol (ADVAIR) 250-50 MCG/DOSE AEPB Inhale 1 puff into the lungs daily.  . furosemide (LASIX) 20 MG tablet TAKE 1 TABLET ONCE DAILY.  . hydrALAZINE (APRESOLINE) 50 MG tablet Take 1 tablet (50 mg total) by mouth 3 (three) times daily.  Marland Kitchen. ibuprofen (ADVIL) 400 MG tablet Take 400 mg by mouth 3 (three) times daily.  Marland Kitchen. LANTUS SOLOSTAR 100 UNIT/ML Solostar Pen Inject 80 Units into the skin at bedtime.  Marland Kitchen. losartan (COZAAR) 100 MG tablet Take 1 tablet by mouth daily.  . metFORMIN (GLUCOPHAGE XR) 500 MG 24 hr tablet Take 1 tablet (500 mg total) by mouth daily with breakfast.  . metoprolol (TOPROL-XL) 200 MG 24 hr tablet Take 200 mg by mouth daily.  . pantoprazole (PROTONIX) 40 MG tablet Take 40 mg by mouth daily.  . potassium chloride (KLOR-CON) 10 MEQ tablet Take 10 mEq by mouth daily.  . traZODone (DESYREL) 100 MG tablet Take 200 mg by mouth at bedtime.   . [DISCONTINUED] LANTUS SOLOSTAR 100 UNIT/ML Solostar Pen Inject 50 Units into the skin at bedtime.   No facility-administered encounter  medications on file as of 07/09/2020.    ALLERGIES: No Known Allergies  VACCINATION STATUS:  There is no immunization history on file for this patient.  Diabetes He presents for his follow-up diabetic visit. He has type 2 diabetes mellitus. Onset time: He was diagnosed through approximate age of 35 years. His disease course has been worsening. There are no hypoglycemic associated symptoms. Pertinent negatives for hypoglycemia include no headaches, seizures or tremors. Associated symptoms include blurred vision, polydipsia, polyuria and weight loss. Pertinent negatives for diabetes include no chest pain. There are no hypoglycemic complications. Symptoms are worsening. Diabetic complications include nephropathy. Risk factors for coronary artery disease include dyslipidemia, diabetes mellitus, family history, male sex, obesity, hypertension, tobacco exposure and sedentary lifestyle. Current diabetic treatments: He is currently on Lantus 30 units nightly, and Jardiance 10 mg p.o. daily. His weight is fluctuating minimally. He is following a generally unhealthy diet. When asked about meal planning, he reported none. He has not had a previous visit with a dietitian. He never participates in exercise. His home blood glucose trend is fluctuating minimally. His breakfast blood glucose range is generally >200 mg/dl. His lunch blood glucose range is generally >200 mg/dl. His dinner blood glucose range is generally >200 mg/dl. His bedtime blood glucose range is generally >200 mg/dl. His overall blood glucose range is >200 mg/dl. (He brought a meter showing 21  readings in the last 07 days averaging 462. His most recent A1c was 13.5%.) An ACE inhibitor/angiotensin II receptor blocker is being taken. He does not see a podiatrist.Eye exam is not current.  Hyperlipidemia This is a chronic problem. The current episode started more than 1 year ago. Exacerbating diseases include chronic renal disease, diabetes and obesity.  Pertinent negatives include no chest pain, myalgias or shortness of breath. Risk factors for coronary artery disease include dyslipidemia, diabetes mellitus, family history, obesity, male sex, hypertension and a sedentary lifestyle.  Hypertension This is a chronic problem. The current episode started more than 1 year ago. Associated symptoms include blurred vision. Pertinent negatives include no chest pain, headaches, palpitations or shortness of breath. Risk factors for coronary artery disease include dyslipidemia, diabetes mellitus, family history, obesity, male gender, sedentary lifestyle and smoking/tobacco exposure. Past treatments include central alpha agonists and angiotensin blockers. Identifiable causes of hypertension include chronic renal disease.     Review of Systems  Constitutional: Positive  for weight loss. Negative for chills and fever.  Eyes: Positive for blurred vision.  Respiratory: Negative for cough and shortness of breath.   Cardiovascular: Negative for chest pain and palpitations.       No Shortness of breath  Gastrointestinal: Negative for abdominal pain, diarrhea, nausea and vomiting.  Endocrine: Positive for polydipsia and polyuria.  Genitourinary: Negative for frequency, hematuria and urgency.  Musculoskeletal: Negative for myalgias.  Skin: Negative for rash.  Neurological: Negative for tremors, seizures and headaches.  Hematological: Does not bruise/bleed easily.  Psychiatric/Behavioral: Negative for hallucinations and suicidal ideas.    Objective:    Vitals with BMI 07/09/2020 07/02/2020 06/21/2020  Height 6\' 0"  6\' 0"  6\' 0"   Weight 394 lbs 395 lbs 10 oz -  BMI 53.42 53.64 -  Systolic 122 110  Diastolic 78 58 84  Pulse 83 72 81    BP 122/78   Pulse 83   Ht 6' (1.829 m)   Wt (!) 394 lb (178.7 kg)   BMI 53.44 kg/m   Wt Readings from Last 3 Encounters:  07/09/20 (!) 394 lb (178.7 kg)  07/02/20 (!) 395 lb 9.6 oz (179.4 kg)  05/11/20 (!) 441 lb 9.6 oz  (200.3 kg)     Physical Exam Constitutional:      General: He is not in acute distress.    Appearance: He is well-developed.  HENT:     Head: Normocephalic and atraumatic.     Mouth/Throat:     Mouth: Mucous membranes are dry.  Neck:     Thyroid: No thyromegaly.     Trachea: No tracheal deviation.  Cardiovascular:     Rate and Rhythm: Normal rate.     Pulses:          Dorsalis pedis pulses are 1+ on the right side and 1+ on the left side.       Posterior tibial pulses are 1+ on the right side and 1+ on the left side.     Heart sounds: S1 normal and S2 normal. No murmur heard. No gallop.   Pulmonary:     Effort: Pulmonary effort is normal. No respiratory distress.     Breath sounds: No wheezing.  Abdominal:     General: Bowel sounds are normal. There is no distension.     Palpations: Abdomen is soft.     Tenderness: There is no abdominal tenderness. There is no guarding.  Musculoskeletal:     Right shoulder: No swelling or deformity.     Cervical back: Normal range of motion and neck supple.  Skin:    General: Skin is warm and dry.     Findings: No rash.     Nails: There is no clubbing.  Neurological:     Mental Status: He is alert and oriented to person, place, and time.     Cranial Nerves: No cranial nerve deficit.     Sensory: No sensory deficit.     Gait: Gait normal.     Deep Tendon Reflexes: Reflexes are normal and symmetric.  Psychiatric:        Speech: Speech normal.        Behavior: Behavior normal. Behavior is cooperative.        Thought Content: Thought content normal.        Judgment: Judgment normal.     Diabetic Labs (most recent): Lab Results  Component Value Date   HGBA1C 13.5 06/21/2020        Assessment & Plan:  1. Type 2 diabetes mellitus with stage 2 chronic kidney disease, with long-term current use of insulin (HCC)   - Bracen Schum has currently uncontrolled symptomatic type 2 DM since  35 years of age. Recent labs reviewed. He  brought a meter showing 21  readings in the last 07 days averaging 462. His most recent A1c was 13.5%.  - I had a long discussion with him about the progressive nature of diabetes and the pathology behind its complications. -his diabetes is complicated by CKD, obesity/sedentary life, smoking and he remains at a high risk for more acute and chronic complications which include CAD, CVA, CKD, retinopathy, and neuropathy. These are all discussed in detail with him.  - I have counseled him on diet  and weight management  by adopting a carbohydrate restricted/protein rich diet. Patient is encouraged to switch to  unprocessed or minimally processed     complex starch and increased protein intake (animal or plant source), fruits, and vegetables. -  he is advised to stick to a routine mealtimes to eat 3 meals  a day and avoid unnecessary snacks ( to snack only to correct hypoglycemia).   - he acknowledges that there is a room for improvement in his food and drink choices. - Suggestion is made for him to avoid simple carbohydrates  from his diet including Cakes, Sweet Desserts, Ice Cream, Soda (diet and regular), Sweet Tea, Candies, Chips, Cookies, Store Bought Juices, Alcohol in Excess of  1-2 drinks a day, Artificial Sweeteners,  Coffee Creamer, and "Sugar-free" Products, Lemonade. This will help patient to have more stable blood glucose profile and potentially avoid unintended weight gain.   - he has been  scheduled with Norm Salt, RDN, CDE for diabetes education.  - I have approached him with the following individualized plan to manage  his diabetes and patient agrees:   -In light of his chronic glycemic burden, he will continue to need insulin treatment in order for him to achieve control of diabetes to target. -He has room to optimize his basal insulin before considering bolus insulin. He is advised to increase Lantus to 80  units nightly, continue to monitor BG 4 times a day and return in 7 days  for reevaluation. - he is encouraged to call clinic for blood glucose levels less than 70 or above 300 mg /dl.  -He will be considered for prandial insulin if he presents with appropriate engagement to monitor and significant postprandial hyperglycemia.  - he is warned not to take insulin without proper monitoring per orders. - he is dehydrated, advised to hydrate with water only and not with sweetened beverages. - Adjustment parameters are given to him for hypo and hyperglycemia in writing.  -I discussed the potential side effects of  SGLT2 inhibitors, he is already on Jardiance.  I have advised him to finish his current supplies of Jardiance 10 mg p.o. daily at breakfast. -He has CKD stage 2, which will not contraindicate use of low-dose metformin.  He is tolerating metformin . He is advised to continue metformin 500 mg XR p.o. daily after breakfast. He may also benefit from a low dose glipizide. I discussed and initiated glipizide 5 mg po qam at breakfast.  -He is counseled for  smoking cessation. The patient was counseled on the dangers of tobacco use, and was advised to quit.  Reviewed strategies to maximize success, including removing cigarettes and smoking materials from environment.   - he will be considered for incretin therapy as appropriate next  visit.  - Specific targets for  A1c;  LDL, HDL,  and Triglycerides were discussed with the patient.  2) Blood Pressure /Hypertension:  his blood pressure is  Controlled  to target.   he is advised to continue his current medications including chlorthalidone 25 mg p.o. daily, clonidine 0.1 mg p.o. twice daily, hydralazine 50 mg p.o. 3 times daily, losartan 100 mg p.o. every morning, metoprolol 200 mg p.o. daily.   Admittedly, he consumes salty food, advised to limit salt consumption.   3) Lipids/Hyperlipidemia: He does not have recent lipid panel to review.  He would be considered for statin intervention next visit.     4)  Weight/Diet:   Body mass index is 53.44 kg/m.  -   clearly complicating his diabetes care.   he is  a candidate for weight loss. I discussed with him the fact that loss of 5 - 10% of his  current body weight will have the most impact on his diabetes management.  Exercise, and detailed carbohydrates information provided  -  detailed on discharge instructions.  5) Chronic Care/Health Maintenance:  -he  is on ARB medications and  is encouraged to initiate and continue to follow up with Ophthalmology, Dentist,  Podiatrist at least yearly or according to recommendations, and advised to  quit smoking. I have recommended yearly flu vaccine and pneumonia vaccine at least every 5 years; moderate intensity exercise for up to 150 minutes weekly; and  sleep for at least 7 hours a day.  - he is  advised to maintain close follow up with Waldon Reining, MD for primary care needs, as well as his other providers for optimal and coordinated care.   I spent 32 minutes in the care of the patient today including review of labs from CMP, Lipids, Thyroid Function, Hematology (current and previous including abstractions from other facilities); face-to-face time discussing  his blood glucose readings/logs, discussing hypoglycemia and hyperglycemia episodes and symptoms, medications doses, his options of short and long term treatment based on the latest standards of care / guidelines;  discussion about incorporating lifestyle medicine;  and documenting the encounter.    Please refer to Patient Instructions for Blood Glucose Monitoring and Insulin/Medications Dosing Guide"  in media tab for additional information. Please  also refer to " Patient Self Inventory" in the Media  tab for reviewed elements of pertinent patient history.  Micah Noel participated in the discussions, expressed understanding, and voiced agreement with the above plans.  All questions were answered to his satisfaction. he is encouraged to contact clinic should he have  any questions or concerns prior to his return visit.   Follow up plan: - Return in about 1 week (around 07/16/2020) for F/U with Meter and Logs Only - no Labs.  Marquis Lunch, MD [facility] [address] [place], [place] [address] Phone: [phone]  Fax: [phone]    07/09/2020, 8:05 PM  This note was partially dictated with voice recognition software. Similar sounding words can be transcribed inadequately or may not  be corrected upon review.

## 2020-07-09 NOTE — Patient Instructions (Signed)

## 2020-07-10 ENCOUNTER — Other Ambulatory Visit: Payer: Self-pay

## 2020-07-10 ENCOUNTER — Ambulatory Visit: Payer: Medicaid Other | Admitting: Podiatry

## 2020-07-10 ENCOUNTER — Encounter: Payer: Self-pay | Admitting: Podiatry

## 2020-07-10 DIAGNOSIS — E1159 Type 2 diabetes mellitus with other circulatory complications: Secondary | ICD-10-CM | POA: Diagnosis not present

## 2020-07-10 DIAGNOSIS — B351 Tinea unguium: Secondary | ICD-10-CM | POA: Diagnosis not present

## 2020-07-10 DIAGNOSIS — M79675 Pain in left toe(s): Secondary | ICD-10-CM

## 2020-07-10 DIAGNOSIS — M79674 Pain in right toe(s): Secondary | ICD-10-CM

## 2020-07-10 NOTE — Progress Notes (Signed)
This patient returns to my office for at risk foot care.  This patient requires this care by a professional since this patient will be at risk due to having diabetes.   This patient is unable to cut nails himself since the patient cannot reach his nails.These nails are painful walking and wearing shoes.  This patient presents for at risk foot care today.  He is brought to the office by caregiver from  [place].  General Appearance  Alert, conversant and in no acute stress.  Vascular  Dorsalis pedis and posterior tibial  pulses are not  palpable  Bilaterally due to swelling..  Capillary return is within normal limits  bilaterally. Temperature is within normal limits  bilaterally.  Neurologic  Senn-Weinstein monofilament wire test within normal limits  bilaterally. Muscle power within normal limits bilaterally.  Nails Thick disfigured discolored nails with subungual debris  from hallux to fifth toes bilaterally. No evidence of bacterial infection or drainage bilaterally.  Orthopedic  No limitations of motion  feet .  No crepitus or effusions noted.  No bony pathology or digital deformities noted.  Skin  normotropic skin with no porokeratosis noted bilaterally.  No signs of infections or ulcers noted.   Callus and fizzures developing sub 5th met right foot.    Onychomycosis  Pain in right toes  Pain in left toes  Consent was obtained for treatment procedures.   Mechanical debridement of nails 1-5  bilaterally performed with a nail nipper.  Filed with dremel without incident. Patient was told to utilize vaseline at the callus site with fizzures.   Return office visit   3 months                 Told patient to return for periodic foot care and evaluation due to potential at risk complications.   Gardiner Barefoot [name]

## 2020-07-12 ENCOUNTER — Ambulatory Visit: Payer: Medicaid Other

## 2020-07-18 ENCOUNTER — Encounter: Payer: Self-pay | Admitting: "Endocrinology

## 2020-07-18 ENCOUNTER — Other Ambulatory Visit: Payer: Self-pay

## 2020-07-18 ENCOUNTER — Ambulatory Visit (INDEPENDENT_AMBULATORY_CARE_PROVIDER_SITE_OTHER): Payer: Medicaid Other | Admitting: "Endocrinology

## 2020-07-18 VITALS — BP 136/82 | HR 56 | Ht 72.0 in | Wt >= 6400 oz

## 2020-07-18 DIAGNOSIS — E1122 Type 2 diabetes mellitus with diabetic chronic kidney disease: Secondary | ICD-10-CM | POA: Diagnosis not present

## 2020-07-18 DIAGNOSIS — E782 Mixed hyperlipidemia: Secondary | ICD-10-CM

## 2020-07-18 DIAGNOSIS — I10 Essential (primary) hypertension: Secondary | ICD-10-CM

## 2020-07-18 DIAGNOSIS — Z794 Long term (current) use of insulin: Secondary | ICD-10-CM

## 2020-07-18 DIAGNOSIS — N182 Chronic kidney disease, stage 2 (mild): Secondary | ICD-10-CM

## 2020-07-18 MED ORDER — METFORMIN HCL ER (MOD) 1000 MG PO TB24: 1000.0000 mg | ORAL_TABLET | Freq: Every day | ORAL | 1 refills | Status: DC

## 2020-07-18 MED ORDER — NOVOLOG MIX 70/30 FLEXPEN (70-30) 100 UNIT/ML ~~LOC~~ SUPN: 60.0000 [IU] | PEN_INJECTOR | Freq: Two times a day (BID) | SUBCUTANEOUS | 11 refills | Status: DC

## 2020-07-18 MED ORDER — GLIPIZIDE ER 10 MG PO TB24: 10.0000 mg | ORAL_TABLET | Freq: Every day | ORAL | 1 refills | Status: DC

## 2020-07-18 NOTE — Patient Instructions (Signed)

## 2020-07-18 NOTE — Progress Notes (Signed)
07/18/2020, 4:53 PM  Endocrinology follow-up note   Subjective:    Patient ID: Melvin Ray, male    DOB: 1985/11/22.  Melvin Ray is being seen in follow up after he was sen in consultation for management of currently uncontrolled symptomatic diabetes requested by  Melvin Reining, MD.   Past Medical History:  Diagnosis Date  . Diabetes mellitus without complication (HCC)   . Hypertension   . Morbid obesity (HCC)   . Obstructive sleep apnea   . Onychomycosis   . Tobacco abuse     Past Surgical History:  Procedure Laterality Date  . APPENDECTOMY      Social History   Socioeconomic History  . Marital status: Single    Spouse name: Not on file  . Number of children: Not on file  . Years of education: Not on file  . Highest education level: Not on file  Occupational History  . Not on file  Tobacco Use  . Smoking status: Current Some Day Smoker    Types: Cigars  . Smokeless tobacco: Former Clinical biochemist  . Vaping Use: Never used  Substance and Sexual Activity  . Alcohol use: Never  . Drug use: Never  . Sexual activity: Not on file  Other Topics Concern  . Not on file  Social History Narrative  . Not on file   Social Determinants of Health   Financial Resource Strain: Low Risk   . Difficulty of Paying Living Expenses: Not very hard  Food Insecurity: No Food Insecurity  . Worried About Programme researcher, broadcasting/film/video in the Last Year: Never true  . Ran Out of Food in the Last Year: Never true  Transportation Needs: Unmet Transportation Needs  . Lack of Transportation (Medical): Yes  . Lack of Transportation (Non-Medical): Yes  Physical Activity: Inactive  . Days of Exercise per Week: 0 days  . Minutes of Exercise per Session: 0 min  Stress: Not on file  Social Connections: Not on file    Family History  Problem Relation Age of Onset  . Heart failure Father   . Hypertension  Father   . Diabetes Maternal Grandmother   . Heart failure Paternal Grandmother        transplant    Outpatient Encounter Medications as of 07/18/2020  Medication Sig  . insulin aspart protamine - aspart (NOVOLOG MIX 70/30 FLEXPEN) (70-30) 100 UNIT/ML FlexPen Inject 0.6 mLs (60 Units total) into the skin 2 (two) times daily before a meal.  . ACCU-CHEK GUIDE test strip 3 (three) times daily.  . Accu-Chek Softclix Lancets lancets 3 (three) times daily.  Marland Kitchen albuterol (VENTOLIN HFA) 108 (90 Base) MCG/ACT inhaler Inhale into the lungs every 6 (six) hours as needed for wheezing or shortness of breath.  . chlorthalidone (HYGROTON) 25 MG tablet TAKE 1 TABLET ONCE DAILY.  . cloNIDine (CATAPRES) 0.1 MG tablet Take 0.1 mg by mouth 2 (two) times daily.  Marland Kitchen diltiazem (CARDIZEM CD) 240 MG 24 hr capsule Take 240 mg by mouth daily.  . empagliflozin (JARDIANCE) 10 MG TABS tablet Take 10 mg by mouth daily.  Marland Kitchen  FLUoxetine (PROZAC) 20 MG tablet Take 20 mg by mouth daily.  . Fluticasone-Salmeterol (ADVAIR) 250-50 MCG/DOSE AEPB Inhale 1 puff into the lungs daily.  . furosemide (LASIX) 20 MG tablet TAKE 1 TABLET ONCE DAILY.  Marland Kitchen glipiZIDE (GLUCOTROL XL) 10 MG 24 hr tablet Take 1 tablet (10 mg total) by mouth daily with breakfast.  . hydrALAZINE (APRESOLINE) 50 MG tablet Take 1 tablet (50 mg total) by mouth 3 (three) times daily.  Marland Kitchen ibuprofen (ADVIL) 400 MG tablet Take 400 mg by mouth 3 (three) times daily.  Marland Kitchen losartan (COZAAR) 100 MG tablet Take 1 tablet by mouth daily.  . metFORMIN (GLUMETZA) 1000 MG (MOD) 24 hr tablet Take 1 tablet (1,000 mg total) by mouth daily with breakfast.  . metoprolol (TOPROL-XL) 200 MG 24 hr tablet Take 200 mg by mouth daily.  . pantoprazole (PROTONIX) 40 MG tablet Take 40 mg by mouth daily.  . potassium chloride (KLOR-CON) 10 MEQ tablet Take 10 mEq by mouth daily.  . traZODone (DESYREL) 100 MG tablet Take 200 mg by mouth at bedtime.   . [DISCONTINUED] glipiZIDE (GLUCOTROL XL) 5 MG 24 hr  tablet Take 1 tablet (5 mg total) by mouth daily with breakfast.  . [DISCONTINUED] LANTUS SOLOSTAR 100 UNIT/ML Solostar Pen Inject 80 Units into the skin at bedtime.  . [DISCONTINUED] metFORMIN (GLUCOPHAGE XR) 500 MG 24 hr tablet Take 1 tablet (500 mg total) by mouth daily with breakfast.   No facility-administered encounter medications on file as of 07/18/2020.    ALLERGIES: No Known Allergies  VACCINATION STATUS:  There is no immunization history on file for this patient.  Diabetes He presents for his follow-up diabetic visit. He has type 2 diabetes mellitus. Onset time: He was diagnosed through approximate age of 31 years. His disease course has been improving. There are no hypoglycemic associated symptoms. Pertinent negatives for hypoglycemia include no headaches, seizures or tremors. Associated symptoms include blurred vision, polydipsia, polyuria and weight loss. Pertinent negatives for diabetes include no chest pain. There are no hypoglycemic complications. Symptoms are improving. Diabetic complications include nephropathy. Risk factors for coronary artery disease include dyslipidemia, diabetes mellitus, family history, male sex, obesity, hypertension, tobacco exposure and sedentary lifestyle. Current diabetic treatments: He is currently on Lantus 30 units nightly, and Jardiance 10 mg p.o. daily. His weight is increasing steadily. He is following a generally unhealthy diet. When asked about meal planning, he reported none. He has not had a previous visit with a dietitian. He never participates in exercise. His home blood glucose trend is fluctuating minimally. His breakfast blood glucose range is generally >200 mg/dl. His lunch blood glucose range is generally >200 mg/dl. His dinner blood glucose range is generally >200 mg/dl. His bedtime blood glucose range is generally >200 mg/dl. His overall blood glucose range is >200 mg/dl. (He brought his meter showing slight improvement in his glycemic  profile.  Over the last 7 days his average blood glucose is 331, 14 days 367, 30 days 385.  His most recent A1c was 13.5%.  He has no hypoglycemia.   ) An ACE inhibitor/angiotensin II receptor blocker is being taken. He does not see a podiatrist.Eye exam is not current.  Hyperlipidemia This is a chronic problem. The current episode started more than 1 year ago. Exacerbating diseases include chronic renal disease, diabetes and obesity. Pertinent negatives include no chest pain, myalgias or shortness of breath. Risk factors for coronary artery disease include dyslipidemia, diabetes mellitus, family history, obesity, male sex, hypertension and a sedentary  lifestyle.  Hypertension This is a chronic problem. The current episode started more than 1 year ago. Associated symptoms include blurred vision. Pertinent negatives include no chest pain, headaches, palpitations or shortness of breath. Risk factors for coronary artery disease include dyslipidemia, diabetes mellitus, family history, obesity, male gender, sedentary lifestyle and smoking/tobacco exposure. Past treatments include central alpha agonists and angiotensin blockers. Identifiable causes of hypertension include chronic renal disease.     Review of Systems  Constitutional: Positive for weight loss. Negative for chills and fever.  Eyes: Positive for blurred vision.  Respiratory: Negative for cough and shortness of breath.   Cardiovascular: Negative for chest pain and palpitations.       No Shortness of breath  Gastrointestinal: Negative for abdominal pain, diarrhea, nausea and vomiting.  Endocrine: Positive for polydipsia and polyuria.  Genitourinary: Negative for frequency, hematuria and urgency.  Musculoskeletal: Negative for myalgias.  Skin: Negative for rash.  Neurological: Negative for tremors, seizures and headaches.  Hematological: Does not bruise/bleed easily.  Psychiatric/Behavioral: Negative for hallucinations and suicidal ideas.     Objective:    Vitals with BMI 07/18/2020 07/09/2020 07/02/2020  Height 6\' 0"  6\' 0"  6\' 0"   Weight 405 lbs 10 oz 394 lbs 395 lbs 10 oz  BMI 55 53.42 53.64  Systolic 136 122 161110  Diastolic 82 78 58  Pulse 56 83 72    BP 136/82   Pulse (!) 56   Ht 6' (1.829 m)   Wt (!) 405 lb 9.6 oz (184 kg)   BMI 55.01 kg/m   Wt Readings from Last 3 Encounters:  07/18/20 (!) 405 lb 9.6 oz (184 kg)  07/09/20 (!) 394 lb (178.7 kg)  07/02/20 (!) 395 lb 9.6 oz (179.4 kg)     Physical Exam Constitutional:      General: He is not in acute distress.    Appearance: He is well-developed.  HENT:     Head: Normocephalic and atraumatic.     Mouth/Throat:     Mouth: Mucous membranes are dry.  Neck:     Thyroid: No thyromegaly.     Trachea: No tracheal deviation.  Cardiovascular:     Rate and Rhythm: Normal rate.     Pulses:          Dorsalis pedis pulses are 1+ on the right side and 1+ on the left side.       Posterior tibial pulses are 1+ on the right side and 1+ on the left side.     Heart sounds: S1 normal and S2 normal. No murmur heard. No gallop.   Pulmonary:     Effort: Pulmonary effort is normal. No respiratory distress.     Breath sounds: No wheezing.  Abdominal:     General: Bowel sounds are normal. There is no distension.     Palpations: Abdomen is soft.     Tenderness: There is no abdominal tenderness. There is no guarding.  Musculoskeletal:     Right shoulder: No swelling or deformity.     Cervical back: Normal range of motion and neck supple.  Skin:    General: Skin is warm and dry.     Findings: No rash.     Nails: There is no clubbing.  Neurological:     Mental Status: He is alert and oriented to person, place, and time.     Cranial Nerves: No cranial nerve deficit.     Sensory: No sensory deficit.     Gait: Gait normal.  Deep Tendon Reflexes: Reflexes are normal and symmetric.  Psychiatric:        Speech: Speech normal.        Behavior: Behavior normal. Behavior is  cooperative.        Thought Content: Thought content normal.        Judgment: Judgment normal.     Diabetic Labs (most recent): Lab Results  Component Value Date   HGBA1C 13.5 06/21/2020        Assessment & Plan:   1. Type 2 diabetes mellitus with stage 2 chronic kidney disease, with long-term current use of insulin (HCC)   - Keiland Pickering has currently uncontrolled symptomatic type 2 DM since  35 years of age. Recent labs reviewed.  He brought his meter showing slight improvement in his glycemic profile.  Over the last 7 days his average blood glucose is 331, 14 days 367, 30 days 385.  His most recent A1c was 13.5%.  He has no hypoglycemia.   - I had a long discussion with him about the progressive nature of diabetes and the pathology behind its complications. -his diabetes is complicated by CKD, obesity/sedentary life, smoking and he remains at a high risk for more acute and chronic complications which include CAD, CVA, CKD, retinopathy, and neuropathy. These are all discussed in detail with him.  - I have counseled him on diet  and weight management  by adopting a carbohydrate restricted/protein rich diet. Patient is encouraged to switch to  unprocessed or minimally processed     complex starch and increased protein intake (animal or plant source), fruits, and vegetables. -  he is advised to stick to a routine mealtimes to eat 3 meals  a day and avoid unnecessary snacks ( to snack only to correct hypoglycemia).   - he acknowledges that there is a room for improvement in his food and drink choices. - Suggestion is made for him to avoid simple carbohydrates  from his diet including Cakes, Sweet Desserts, Ice Cream, Soda (diet and regular), Sweet Tea, Candies, Chips, Cookies, Store Bought Juices, Alcohol in Excess of  1-2 drinks a day, Artificial Sweeteners,  Coffee Creamer, and "Sugar-free" Products, Lemonade. This will help patient to have more stable blood glucose profile and potentially  avoid unintended weight gain.   - he has been  scheduled with Norm Salt, RDN, CDE for diabetes education.  - I have approached him with the following individualized plan to manage  his diabetes and patient agrees:   -In light of his chronic glycemic burden, he will continue to need insulin treatment in order for him to achieve control of diabetes to target. -He would need multiple daily injections of insulin.  He is too risky to take multiple daily injections of insulin with different regimen.  -After he finishes current supplies of Lantus 80 units nightly, advised patient to focus 70/3060 units twice daily for Premeal blood glucose readings above 90 mg/day.    He was urged to continue to monitor blood glucose 4 times a day- before meals and at bedtime.    - he is encouraged to call clinic for blood glucose levels less than 70 or above 300 mg /dl.  - he is warned not to take insulin without proper monitoring per orders. - he is dehydrated, advised to hydrate with water only and not with sweetened beverages.  -I discussed the potential side effects of  SGLT2 inhibitors, he is already on Jardiance.  I have advised him to finish his current  supplies of Jardiance 10 mg p.o. daily at breakfast. -He has CKD stage 2, which will not contraindicate use of low-dose metformin.  He is tolerating metformin . He is advised to increase metformin to 1000 mg XR p.o. daily after breakfast.  He will also benefit from higher dose of glipizide.  I discussed and increase his glipizide to 10 mg XL p.o. daily at breakfast.   -He is counseled for  smoking cessation, he is too high risk to's give incretin therapy due to his risk of pancreatitis. The patient was counseled on the dangers of tobacco use, and was advised to quit.  Reviewed strategies to maximize success, including removing cigarettes and smoking materials from environment.   - Specific targets for  A1c;  LDL, HDL,  and Triglycerides were discussed  with the patient.  2) Blood Pressure /Hypertension:  his blood pressure is  Controlled  to target.   he is advised to continue his current medications including  chlorthalidone 25 mg p.o. daily, clonidine 0.1 mg p.o. twice daily, hydralazine 50 mg p.o. 3 times daily, losartan 100 mg p.o. every morning, metoprolol 200 mg p.o. daily.   Admittedly, he consumes salty food, advised to limit salt consumption.   3) Lipids/Hyperlipidemia: He does not have recent lipid panel to review.  He would be considered for statin intervention next visit.     4)  Weight/Diet:  Body mass index is 55.01 kg/m.  -   clearly complicating his diabetes care.   he is  a candidate for modest weight loss. I discussed with him the fact that loss of 5 - 10% of his  current body weight will have the most impact on his diabetes management.  Exercise, and detailed carbohydrates information provided  -  detailed on discharge instructions.  5) Chronic Care/Health Maintenance:  -he  is on ARB medications and  is encouraged to initiate and continue to follow up with Ophthalmology, Dentist,  Podiatrist at least yearly or according to recommendations, and advised to  quit smoking. I have recommended yearly flu vaccine and pneumonia vaccine at least every 5 years; moderate intensity exercise for up to 150 minutes weekly; and  sleep for at least 7 hours a day.  - he is  advised to maintain close follow up with Melvin Reining, MD for primary care needs, as well as his other providers for optimal and coordinated care.   I spent 30 minutes in the care of the patient today including review of labs from CMP, Lipids, Thyroid Function, Hematology (current and previous including abstractions from other facilities); face-to-face time discussing  his blood glucose readings/logs, discussing hypoglycemia and hyperglycemia episodes and symptoms, medications doses, his options of short and long term treatment based on the latest standards of care /  guidelines;  discussion about incorporating lifestyle medicine;  and documenting the encounter.    Please refer to Patient Instructions for Blood Glucose Monitoring and Insulin/Medications Dosing Guide"  in media tab for additional information. Please  also refer to " Patient Self Inventory" in the Media  tab for reviewed elements of pertinent patient history.  Micah Noel and his caretaker, participated in the discussions, expressed understanding, and voiced agreement with the above plans.  All questions were answered to his satisfaction. he is encouraged to contact clinic should he have any questions or concerns prior to his return visit.    Follow up plan: - Return in about 4 weeks (around 08/15/2020) for F/U with Meter and Logs Only - no Labs.  Marquis Lunch, MD [facility] [address] [place], [place] [address] Phone: [phone]  Fax: [phone]    07/18/2020, 4:53 PM  This note was partially dictated with voice recognition software. Similar sounding words can be transcribed inadequately or may not  be corrected upon review.

## 2020-07-19 ENCOUNTER — Ambulatory Visit (INDEPENDENT_AMBULATORY_CARE_PROVIDER_SITE_OTHER): Payer: Medicaid Other

## 2020-07-19 DIAGNOSIS — Z0000 Encounter for general adult medical examination without abnormal findings: Secondary | ICD-10-CM

## 2020-07-19 NOTE — Progress Notes (Signed)
Appointment Outcome:  Completed, Session #: 1 Start time: 1:38pm   End time: 2:40pm   Total Mins: 1 hour 2 minutes  AGREEMENTS SECTION  Overall Goal(s): Improve healthy eating behaviors Increase physical activity                                                Agreement/Action Steps:  Improve health eating behaviors Follow DASH diet  Read food labels Track sodium with log (1500-2000 mg of sodium per day) Don't cook with salt   Increase physical activity Walk 2-3 days per week (30-45 minutes = about 1 mile) Perform chair exercises 10 minutes per day  Progress Notes:  Patient stated that he has lost and gained week over the past few weeks. Patient mentioned that he did weigh 424 lbs., he lost his appetite and went down to 395 lbs., and currently weighs 405 lbs. Patient stated that she has been contacted by Karl Bales about PREP. Patient has been made aware that the next class starts in mid-June.   Patient stated that he walked to a store by his home (motivated by not having transportation) and back one day. Patient stated that he took a long walk in [place] as well but have not walked in the past week. Patient stated that the weather (heat and rain) interfered with him walking during that time. Patient stated that he also takes his dog out in the back yard 4-5 times a day as well. Patient has not started performing chair exercises yet.   Patient shared that he has not been reading food labels. Patient has not been tracking his sodium consumption between meals with the Sodium Tracker. Patient stated that he had not received the packet that was mailed to him that contained these sheets.   Patient has misplaced the information on the Dash Diet provided by Dr. Oval Linsey. However, the patient has been instructed by his Endocrinologist to eat a particular way to manage diabetes and have restricted more foods from his diet. Patient has not had a soda in a week and a half. Patient has been  instructed not to eat chips, bread, cakes, juice, or anything with sugar. Patient shared that he has been drinking more water and Gatorade Zero. Patient stated that he has been eating a meat, vegetable, and fruit. Patient mentioned that he continues not to cook with salt. Patient also shared that he does eat a lot of fast food and plans to get some fried gizzards later today.   Indicators of Success and Accountability:  Patient stated that giving up soda and starting to walk are his indicators of success and accountability.  . Readiness: Patient is in the action phase of improving healthy eating behaviors and increasing physical activity.  . Strengths and Supports: Patient is being supported by his Education officer, museum at this time. Patient stated that his strength is embedded in his motivation to lose weight. . Challenges and Barriers: The changes in the weather challenged the patient's ability to walk consistently ad eating fast food challenges the patient to eat healthy.  Coaching Outcomes: Patient shared that he could go harder than he's currently doing. 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 Patient stated that he is motivated to lose weight so he can start  traveling. Patient is desiring to visit his great grandmother in [place] soon. Patient also stated that he is motivated to lose weight and get in better shape so he can make more money.  Patient stated that he did not receive a copy of the health coaching agreement that was mailed to him. While in the office, he signed the health coaching agreement and received another copy of the Code of Ethics.   Patient has been referred to the [facility] and [place] for further assistance with his diet. His social worker wants to ensure that transportation can be arranged for these appointments.  Patient has been mailed copies of the Sodium Tracker to monitor his  sodium intake between meals.   Patient wants to continue working towards his steps as outlined above with the following changes:   Patient will start walking to his nephew's house down the street (about one-mile round trip) between 6-7pm on Mon, Wed, and Fri.  Patient will follow dietary restrictions from Endocrinologist.    Attempted: Marland Kitchen Fulfilled - Patient continues to cook without salt.  . Partial - Patient has walked twice one week but wasn't able to do during the second week.  . Not met - Patient has not been implementing the DASH diet as recommended by Dr. Oval Linsey. Patient is not reading food labels or tracking sodium with log. Patient has not performed chair exercises.    Referrals: Patient has been referred to the [facility] and [place] by Care Guide.

## 2020-07-31 ENCOUNTER — Ambulatory Visit (INDEPENDENT_AMBULATORY_CARE_PROVIDER_SITE_OTHER): Payer: Medicaid Other

## 2020-07-31 ENCOUNTER — Ambulatory Visit (INDEPENDENT_AMBULATORY_CARE_PROVIDER_SITE_OTHER): Payer: Medicaid Other | Admitting: Pharmacist

## 2020-07-31 ENCOUNTER — Other Ambulatory Visit: Payer: Self-pay

## 2020-07-31 ENCOUNTER — Telehealth: Payer: Self-pay

## 2020-07-31 VITALS — BP 166/118 | HR 69 | Resp 18 | Ht 72.0 in

## 2020-07-31 DIAGNOSIS — Z0000 Encounter for general adult medical examination without abnormal findings: Secondary | ICD-10-CM

## 2020-07-31 DIAGNOSIS — R609 Edema, unspecified: Secondary | ICD-10-CM | POA: Insufficient documentation

## 2020-07-31 DIAGNOSIS — B351 Tinea unguium: Secondary | ICD-10-CM | POA: Insufficient documentation

## 2020-07-31 DIAGNOSIS — I10 Essential (primary) hypertension: Secondary | ICD-10-CM | POA: Diagnosis not present

## 2020-07-31 DIAGNOSIS — F329 Major depressive disorder, single episode, unspecified: Secondary | ICD-10-CM | POA: Insufficient documentation

## 2020-07-31 DIAGNOSIS — M1990 Unspecified osteoarthritis, unspecified site: Secondary | ICD-10-CM | POA: Insufficient documentation

## 2020-07-31 DIAGNOSIS — Z7985 Long-term (current) use of injectable non-insulin antidiabetic drugs: Secondary | ICD-10-CM | POA: Insufficient documentation

## 2020-07-31 DIAGNOSIS — I509 Heart failure, unspecified: Secondary | ICD-10-CM | POA: Insufficient documentation

## 2020-07-31 DIAGNOSIS — J988 Other specified respiratory disorders: Secondary | ICD-10-CM | POA: Insufficient documentation

## 2020-07-31 DIAGNOSIS — Z79899 Other long term (current) drug therapy: Secondary | ICD-10-CM | POA: Insufficient documentation

## 2020-07-31 DIAGNOSIS — L02224 Furuncle of groin: Secondary | ICD-10-CM | POA: Insufficient documentation

## 2020-07-31 NOTE — Progress Notes (Signed)
Patient ID: Melvin Ray                 DOB: 11-28-1985                      MRN: [id number]     HPI: Melvin Ray is a 35 y.o. male referred by Dr. Duke Salvia to HTN clinic. PMH is significant for T2DM (A1c 13.5), hypertension, morbid obesity, obstructive sleep apnea who presents today for hypertension clinic evaluation. Patient arrived at office visit with social worker who provides transportation for office and grocery store visits.  Patient arrives today with Child psychotherapist.  Did not take any of his medications today and did not know what he is supposed to be taking since meds come in a pill pack and he takes them all at once.    Medication list has losartan listed, however patient had been switched to valsartan.    Has been working closely with care guide to learn how to read nutrition labels and to avoid salt.  Has been working with endocrinology to reduce carbohydrate intake.  Care guide has been encouraging patient to be more physcially active and is working on enrolling him in weight loss programs.  Has only been awake for a few hours.  Typically goes to bed "when the sun has come up" around 7AM and sleeps until 2-3pm.  Current HTN meds: chlorthalidone 25mg  daily, furosemide 20mg  daily, hydralazine 50 mg tID, mtoprolol succinate 200mg  daily,  Valsartan? Losartan?  BP goal: <130/80   Wt Readings from Last 3 Encounters:  07/18/20 (!) 405 lb 9.6 oz (184 kg)  07/09/20 (!) 394 lb (178.7 kg)  07/02/20 (!) 395 lb 9.6 oz (179.4 kg)   BP Readings from Last 3 Encounters:  07/18/20 136/82  07/09/20 122/78  07/02/20 (!) 110/58   Pulse Readings from Last 3 Encounters:  07/18/20 (!) 56  07/09/20 83  07/02/20 72    Renal function: CrCl cannot be calculated (No successful lab value found.).  Past Medical History:  Diagnosis Date  . Diabetes mellitus without complication (HCC)   . Hypertension   . Morbid obesity (HCC)   . Obstructive sleep apnea   . Onychomycosis   . Tobacco abuse      Current Outpatient Medications on File Prior to Visit  Medication Sig Dispense Refill  . ACCU-CHEK GUIDE test strip 3 (three) times daily.    . Accu-Chek Softclix Lancets lancets 3 (three) times daily.    07/20/20 albuterol (VENTOLIN HFA) 108 (90 Base) MCG/ACT inhaler Inhale into the lungs every 6 (six) hours as needed for wheezing or shortness of breath.    . chlorthalidone (HYGROTON) 25 MG tablet TAKE 1 TABLET ONCE DAILY. 90 tablet 1  . cloNIDine (CATAPRES) 0.1 MG tablet Take 0.1 mg by mouth 2 (two) times daily.    07/11/20 diltiazem (CARDIZEM CD) 240 MG 24 hr capsule Take 240 mg by mouth daily.    . empagliflozin (JARDIANCE) 10 MG TABS tablet Take 10 mg by mouth daily.    09/01/20 FLUoxetine (PROZAC) 20 MG tablet Take 20 mg by mouth daily.    . Fluticasone-Salmeterol (ADVAIR) 250-50 MCG/DOSE AEPB Inhale 1 puff into the lungs daily.    . furosemide (LASIX) 20 MG tablet TAKE 1 TABLET ONCE DAILY. 90 tablet 1  . glipiZIDE (GLUCOTROL XL) 10 MG 24 hr tablet Take 1 tablet (10 mg total) by mouth daily with breakfast. 90 tablet 1  . hydrALAZINE (APRESOLINE) 50 MG tablet Take 1 tablet (  50 mg total) by mouth 3 (three) times daily. 270 tablet 3  . ibuprofen (ADVIL) 400 MG tablet Take 400 mg by mouth 3 (three) times daily.    . insulin aspart protamine - aspart (NOVOLOG MIX 70/30 FLEXPEN) (70-30) 100 UNIT/ML FlexPen Inject 0.6 mLs (60 Units total) into the skin 2 (two) times daily before a meal. 15 mL 11  . losartan (COZAAR) 100 MG tablet Take 1 tablet by mouth daily.    . metFORMIN (GLUMETZA) 1000 MG (MOD) 24 hr tablet Take 1 tablet (1,000 mg total) by mouth daily with breakfast. 90 tablet 1  . metoprolol (TOPROL-XL) 200 MG 24 hr tablet Take 200 mg by mouth daily.    . pantoprazole (PROTONIX) 40 MG tablet Take 40 mg by mouth daily.    . potassium chloride (KLOR-CON) 10 MEQ tablet Take 10 mEq by mouth daily.    . traZODone (DESYREL) 100 MG tablet Take 200 mg by mouth at bedtime.      No current facility-administered  medications on file prior to visit.    No Known Allergies   Assessment/Plan:  1. Hypertension - Patient BP in room elevated at 166/118 which is above goal of <130/80, however patient has not taken any of his BP medications today.  Blood pressure had actually been trending down recently at PCP and endocrinology office.  Confusion regarding which medications patient is taking.  Unable to make changes at this time since unclear what his BP is while on medications and which ARB he is taking.  Instructed social worker to send over via email his med list from his pill pack when he transports patient home.  Patient and social worker voiced understanding.  Laural Golden, PharmD, BCACP, CDCES, CPP [facility] [address] [place], [place] [address] Phone: [phone]; Fax: [phone] 07/31/2020 5:40 PM

## 2020-07-31 NOTE — Patient Instructions (Addendum)
It was nice meeting you today!  We would like to get your blood pressure less than 130/80  Please continue you chlorthalidone 25mg  daily, furosemide 20mg  daily, hydralazine 50 mg three times a day, and metoprolol 200mg  daily.  When you get back to your house, please email the package the medications come in with the names of them  [email or web address]   , PharmD, BCACP, CDCES, CPP [facility] [address] [place], [facility] [address] Phone: [phone]; Fax: [phone] 07/31/2020 2:35 PM

## 2020-07-31 NOTE — Progress Notes (Signed)
Appointment Outcome:  Completed, Session #: 2 Start time: 1:23pm   End time: 1:56pm   Total Mins: 33 minutes   AGREEMENTS SECTION     Overall Goal(s): Improve healthy eating behaviors Increase physical activity                                                Agreement/Action Steps:  Improve health eating behaviors Read food labels Track sodium with log (1500-2000 mg of sodium per day) Don't cook with salt Follow dietary restrictions from Endocrinologist.    Increase physical activity Walk 2-3 days per week (30-45 minutes = about 1 mile) ? Start walking to nephew's house down the street or to the store (about one-mile round trip) between 6-7pm on Mon, Wed, and Fri. Perform chair exercises 10 minutes per day  Progress Notes:  Patient stated that he needs to check his mailbox to ensure that he received the information that I mailed him after his last visit with log to track physical activity. Patient stated that he had been walking everyday to the store and his nephew's house, except for the past five days. Patient stated that he had a toothache and a migraine. Patient did not perform any chair exercises because he does not have a durable chair to use. He only has a recliner and a the legs on his other chairs are bent and not stable.   Patient shared that he has been looking at food labels on foods in his home and have learned about the sodium in the foods that he typically eat. Patient stated that he will read food labels when he goes grocery shopping on Thursday. Patient stated that reading food labels have helped him make better choices with foods. Patient stated that the knows there are a lot of sodium in can goods, frozen foods, and noodles. Patient mentioned that he use to eat two packs of noodles until he learned that there was approximately 700 mg of sodium in one pack. Now the patient does not eat them. Patient has been tracking their sodium consumption with the log and recognizes  that he has been able to not go over 2000 mg of sodium per day, especially because he doesn't eat much.  Patient has begun to follow the recommendations of his Endocrinologist by refraining from eating french fries, cakes, and other carbohydrates. Patient did mention that he had a soda one day when his glucose had dropped. Patient stated that he choose soda over orange juice because he knew he would drink all of the juice. Patient mentioned that he drink mostly water every day. Patient has become aware of his weaknesses when it comes to his diet.   Patient stated that his palette has changed because he tried to eat barbecue sauce from a restaurant and could not because it was too sweet. Patient mentioned that he has been eating salads, and has cooked hamburgers in the oven. Patient continues to cook with no salt added. Patient stated that he has an air fryer that he has not taken out of the box to use yet. Patient shared that he sometimes eat fried chicken/gizzards. Patient mentioned that he does not eat often during the day. Sometimes he does not eat until 6-8 pm and right before laying down.  Patient shared that Melvin Ray text him to call her to set up his initial  appointment for next week. Patient asked about transportation with [facility] to PREP and any other appointments while his social worker is out for the next 4-6 weeks. Patient is still interested in being seen by the [facility] [place].    Indicators of Success and Accountability:  Patient stated that walking and getting his blood sugar down into the 100s are his indicators of success and accountability.  . Readiness: Patient is in action phase of improving healthy eating behaviors and increasing physical activity. . Strengths and Supports: Patient is being supported by Education officer, museum and a friend. Patient stated that his determination is his strength. . Challenges and Barriers: Patient may have challenges with eating healthy  when thinking about foods he have to avoid.   Coaching Outcomes: Patient will continue to implement action steps as outlined, but will replace chair exercises with bodyweight exercises of his choice that he can perform standing, laying on the floor, or leaning on the wall. Patient will think about exercises from the days when he use to do football workouts. See new agreement below.   Agreement/Action Steps:  Improve health eating behaviors Read food labels Track sodium with log (1500-2000 mg of sodium per day) Don't cook with salt Patient will follow dietary restrictions from Endocrinologist.    Increase physical activity Walk 2-3 days per week (30-45 minutes = about 1 mile) ? Patient will start walking to his nephew's house down the street or  to the store (about one-mile round trip) between 6-7pm on Mon,  Wed, and Fri. Perform bodyweight exercises 10 minutes per day   Patient will be scheduled for a telephonic health coaching session two weeks from now because social worker will be out due to surgery.  Care Guide will follow up on referral to the [facility] [place]. Patient will call Care Guide to inform her of the date he will be attending PREP so transportation can be arranged.    Attempted: Marland Kitchen Fulfilled - Patient was able to walk as agreed, read food labels, track sodium with log, avoid cooking with salt, and following dietary restrictions from Endocrinologist.  . Not met - Patient did not attempt the chair exercises.

## 2020-07-31 NOTE — Telephone Encounter (Signed)
Text message sent to set up assessment visit for upcoming PREP program.

## 2020-08-03 ENCOUNTER — Telehealth: Payer: Self-pay

## 2020-08-03 NOTE — Telephone Encounter (Signed)
Received call from Nell, wants to participate in PREP program at [facility]. Assessment intake visit scheduled for 11:30 June 14, will contact [facility] transportation to provide transportation. Classes will be held every Tuesday/Thursday from 12:30-1:45 for 12 weeks beginning June 21.

## 2020-08-07 NOTE — Progress Notes (Signed)
[facility] PREP Progress Report   Patient Details  Name: Melvin Ray MRN: [id number] Date of Birth: 11-Apr-1985 Age: 35 y.o. PCP: Waldon Reining, MD  Vitals:   08/07/20 1211  BP: (!) 150/84  Pulse: 65  SpO2: 97%  Weight: (!) 421 lb 9.6 oz (191.2 kg)      [name] Eval - 08/07/20 1200       Referral    Referring Provider [place]/HTN    Reason for referral Diabetes;Hypertension;Obesitity/Overweight;Inactivity    Program Start Date 08/14/20      Measurement   Neck measurement 21 Inches    Hip Circumference 65.5 inches    Body fat 42.7 percent      Information for Trainer   Goals --   Lose 25 lbs by end of program   Current Exercise walking    Orthopedic Concerns NA    Pertinent Medical History --   Asthma, diabetes, HTN   Current Barriers --   Doctor's appointments   Medications that affect exercise Asthma inhaler      Timed Up and Go (TUGS)   Timed Up and Go Low risk <9 seconds      Mobility and Daily Activities   I find it easy to walk up or down two or more flights of stairs. 2    I have no trouble taking out the trash. 3    I do housework such as vacuuming and dusting on my own without difficulty. 4    I can easily lift a gallon of milk (8lbs). 4    I can easily walk a mile. 4    I have no trouble reaching into high cupboards or reaching down to pick up something from the floor. 4    I do not have trouble doing out-door work such as Loss adjuster, chartered, raking leaves, or gardening. 3      Mobility and Daily Activities   I feel younger than my age. 1    I feel independent. 4    I feel energetic. 2    I live an active life.  1    I feel strong. 2    I feel healthy. 2    I feel active as other people my age. 2      How fit and strong are you.   Fit and Strong Total Score 38            Past Medical History:  Diagnosis Date   Diabetes mellitus without complication (HCC)    Hypertension    Morbid obesity (HCC)    Obstructive sleep apnea     Onychomycosis    Tobacco abuse    Past Surgical History:  Procedure Laterality Date   APPENDECTOMY     Social History   Tobacco Use  Smoking Status Some Days   Pack years: 0.00   Types: Cigars  Smokeless Tobacco Former    Lists his SW [employer] as emergency contact, have called and left message with him to call backk and assist with coordination of doctor's visits and PREP program. Barrister's clerk signed and submitted to [facility] transportation for [employer].   Sonia Baller 08/07/2020, 12:17 PM

## 2020-08-14 ENCOUNTER — Telehealth: Payer: Self-pay

## 2020-08-14 ENCOUNTER — Ambulatory Visit (INDEPENDENT_AMBULATORY_CARE_PROVIDER_SITE_OTHER): Payer: Medicaid Other

## 2020-08-14 ENCOUNTER — Other Ambulatory Visit: Payer: Self-pay

## 2020-08-14 DIAGNOSIS — Z0000 Encounter for general adult medical examination without abnormal findings: Secondary | ICD-10-CM

## 2020-08-14 NOTE — Telephone Encounter (Signed)
Received call from [place] stating his transportation had been cancelled for today to attend first PREP class at [facility]. Contacted [place] transport, they reported they called Fayrene Fearing last week to confirm pick up for today, he did not answer, they left him a voicemail, but he did not call back to confirm he needed transportation. If patients do not confirm  or call back, they cancel the transportation ride. Contacted [place] who reports he missed that call last week, but does not know how to access or listen to his voicemail. Asked him if friend, family member, or public transport could bring him today, he has no one to assist. Attempted to request Benedetto Goad for transportation, no rides available. Kaylem was going to cancel coming to the PREP program, but was able to reassure him that if he came Thursday I could review all that he missed at today's meeting. I let him know I had confirmed transportation for him this coming Thursday and he plans to attend. Have set up on his [facility] transport profile that they should contact me as back up if [facility] does not answer or return call to confirm transportation for next upcoming PREP Classes for 12 weeks. Lorry understands he has to confirm his transportation once the dispatcher calls him, he also will contact me if he misses the call so I can confirm for him.

## 2020-08-14 NOTE — Progress Notes (Signed)
Appointment Outcome:  Completed, Session #: 3 Start time: 4:00pm   End time: 4:29pm   Total Mins: 29 minutes  AGREEMENTS SECTION  Overall Goal(s): Improve healthy eating behaviors Increase physical activity                                                 Agreement/Action Steps: Improve health eating behaviors Read food labels Track sodium with log (1500-2000 mg of sodium per day) Don't cook with salt Patient will follow dietary restrictions from Endocrinologist.   Increase physical activity Walk 2-3 days per week (30-45 minutes = about 1 mile) Patient will start walking to his nephew's house down the street or to the store (about  one-mile round trip) between 6-7pm on Mon, Wed, and Fri. Perform bodyweight exercises 10 minutes per day  Progress Notes:  Patient stated that he had received the packet that was mailed to him that included the Sodium Tracker log sheets. Patient reported that he has not used the tracker sheets yet but has been performing mental math to keep track of not going over his aim of 2000 mg of sodium per day. Patient stated that he is reading food labels to determine how much sodium and sugar is included in his food. Patient stated that he calculates the sodium for each meal including his beverages. Patient stated that he will look at the log and determine if he will use this as a step alternative to the mental tracking.   Patient shared that he read food labels when he goes grocery shopping. Patient is still having difficulties following the dietary restrictions from Endocrinologist. Patient stated that he recently purchased ham, cheese, hot pockets, chicken patties, lamb chops, and fried okra. Patient stated that he did purchase fries but have not opened the bag. Patient stated that he continues to not cook with salt and is seasoning his food with garlic and onion powder, and pepper.   Patient mentioned that the PREP class started today but [facility]. Patient stated that he didn't know he had to confirm his ride until now. Patient has secured his ride for Thursday's class. Patient shared that he has not performed body weight exercises yet. Patient stated that he [facility] instead. Patient stated that the PREP class will be on [place].   Patient stated that he has walked an average of 4-5 days each week. Patient stated that he mainly walks out of boredom and sometimes walks more than once a day. Patient has been walking further to the corner or past that location. Patient stated that he waits till the evenings most times around 6-7pm. Patient shared that he feels that his stamina has improved because he can walk longer distances without getting tired. Patient stated that he can walk to the store and back without having to sit or take a break. Patient enjoys walking and expressed that sometimes he gets lost in walking and lose track of time. Patient is walking on average 45 minutes.   Indicators of Success and Accountability:  Patient stated that he was able to get his blood sugar down and has increase his walking, which are his indicators of success and accountability. Readiness: Patient is in the action phase of increasing physical activity and improving healthy eating behaviors.  Strengths   and Supports: Patient is being supported by a friend. Patient stated that his strength is being motivated. Challenges and Barriers: Patient does not foresee any challenges or barriers to implementing his steps over the next two weeks.   Coaching Outcomes: Patient wants to discontinue cooking with no salt as a step because he feels as though he has that mastered.   Patient will replace performing bodyweight exercises [facility] in addition to his walking.  Patient stated that to stay motivated he just do what is necessary and not think about it. Patient declared that he was not going back to the old size,  which also keeps him motivated.   Attempted: Fulfilled - Patient has been reading food labels, not cooking with salt, and walking more than 2-3 days per week for 30-45 minutes.  Partial - Patient is having challenges following dietary restrictions from Endocrinologist because they cut out some carbs but continue to eat some.  Not met - Patient did not track sodium consumption via log during the past two weeks.   Not attempted: Dropped/Revised - Patient did not perform bodyweight exercises 10 minutes per day and will be replacing that with attending PREP class on [place] [facility] [place] as scheduled.   

## 2020-08-15 ENCOUNTER — Ambulatory Visit (INDEPENDENT_AMBULATORY_CARE_PROVIDER_SITE_OTHER): Payer: Medicaid Other | Admitting: "Endocrinology

## 2020-08-15 ENCOUNTER — Encounter: Payer: Self-pay | Admitting: "Endocrinology

## 2020-08-15 VITALS — BP 138/86 | HR 68 | Ht 72.0 in | Wt >= 6400 oz

## 2020-08-15 DIAGNOSIS — N182 Chronic kidney disease, stage 2 (mild): Secondary | ICD-10-CM

## 2020-08-15 DIAGNOSIS — E782 Mixed hyperlipidemia: Secondary | ICD-10-CM

## 2020-08-15 DIAGNOSIS — I10 Essential (primary) hypertension: Secondary | ICD-10-CM

## 2020-08-15 DIAGNOSIS — E1122 Type 2 diabetes mellitus with diabetic chronic kidney disease: Secondary | ICD-10-CM | POA: Diagnosis not present

## 2020-08-15 DIAGNOSIS — Z794 Long term (current) use of insulin: Secondary | ICD-10-CM

## 2020-08-15 NOTE — Progress Notes (Signed)
08/15/2020, 3:51 PM  Endocrinology follow-up note   Subjective:    Patient ID: Melvin Ray, male    DOB: [birth year].  Melvin Ray is being seen in follow up after he was sen in consultation for management of currently uncontrolled symptomatic diabetes requested by  Waldon Reining, MD.   Past Medical History:  Diagnosis Date   Diabetes mellitus without complication (HCC)    Hypertension    Morbid obesity (HCC)    Obstructive sleep apnea    Onychomycosis    Tobacco abuse     Past Surgical History:  Procedure Laterality Date   APPENDECTOMY      Social History   Socioeconomic History   Marital status: Single    Spouse name: Not on file   Number of children: Not on file   Years of education: Not on file   Highest education level: Not on file  Occupational History   Not on file  Tobacco Use   Smoking status: Some Days    Pack years: 0.00    Types: Cigars   Smokeless tobacco: Former  Building services engineer Use: Never used  Substance and Sexual Activity   Alcohol use: Never   Drug use: Never   Sexual activity: Not on file  Other Topics Concern   Not on file  Social History Narrative   Not on file   Social Determinants of Health   Financial Resource Strain: Low Risk    Difficulty of Paying Living Expenses: Not very hard  Food Insecurity: No Food Insecurity   Worried About Programme researcher, broadcasting/film/video in the Last Year: Never true   Barista in the Last Year: Never true  Transportation Needs: Unmet Transportation Needs   Lack of Transportation (Medical): Yes   Lack of Transportation (Non-Medical): Yes  Physical Activity: Insufficiently Active   Days of Exercise per Week: 3 days   Minutes of Exercise per Session: 40 min  Stress: Not on file  Social Connections: Not on file    Family History  Problem Relation Age of Onset   Heart failure Father    Hypertension Father     Diabetes Maternal Grandmother    Heart failure Paternal Grandmother        transplant    Outpatient Encounter Medications as of 08/15/2020  Medication Sig   insulin glargine (LANTUS) 100 UNIT/ML Solostar Pen Inject 60 Units into the skin at bedtime.   [DISCONTINUED] insulin glargine (LANTUS SOLOSTAR) 100 UNIT/ML Solostar Pen Inject 80 Units into the skin at bedtime.   ACCU-CHEK GUIDE test strip 3 (three) times daily.   Accu-Chek Softclix Lancets lancets 3 (three) times daily.   albuterol (VENTOLIN HFA) 108 (90 Base) MCG/ACT inhaler Inhale into the lungs every 6 (six) hours as needed for wheezing or shortness of breath.   chlorthalidone (HYGROTON) 25 MG tablet TAKE 1 TABLET ONCE DAILY.   cloNIDine (CATAPRES) 0.1 MG tablet Take 0.1 mg by mouth 2 (two) times daily.   diltiazem (CARDIZEM CD) 240 MG 24 hr capsule Take 240 mg by mouth daily.   FLUoxetine (PROZAC) 20 MG  tablet Take 20 mg by mouth daily.   Fluticasone-Salmeterol (ADVAIR) 250-50 MCG/DOSE AEPB Inhale 1 puff into the lungs daily. (Patient not taking: Reported on 08/15/2020)   furosemide (LASIX) 20 MG tablet TAKE 1 TABLET ONCE DAILY.   glipiZIDE (GLUCOTROL XL) 10 MG 24 hr tablet Take 1 tablet (10 mg total) by mouth daily with breakfast.   hydrALAZINE (APRESOLINE) 50 MG tablet Take 1 tablet (50 mg total) by mouth 3 (three) times daily.   ibuprofen (ADVIL) 400 MG tablet Take 400 mg by mouth 3 (three) times daily.   metFORMIN (GLUMETZA) 1000 MG (MOD) 24 hr tablet Take 1 tablet (1,000 mg total) by mouth daily with breakfast.   metoprolol (TOPROL-XL) 200 MG 24 hr tablet Take 200 mg by mouth daily.   pantoprazole (PROTONIX) 40 MG tablet Take 40 mg by mouth daily.   potassium chloride (KLOR-CON) 10 MEQ tablet Take 10 mEq by mouth daily.   traZODone (DESYREL) 100 MG tablet Take 200 mg by mouth at bedtime.    valsartan (DIOVAN) 320 MG tablet Take 320 mg by mouth daily.   [DISCONTINUED] empagliflozin (JARDIANCE) 10 MG TABS tablet Take 10 mg by  mouth daily.   [DISCONTINUED] insulin aspart protamine - aspart (NOVOLOG MIX 70/30 FLEXPEN) (70-30) 100 UNIT/ML FlexPen Inject 0.6 mLs (60 Units total) into the skin 2 (two) times daily before a meal.   No facility-administered encounter medications on file as of 08/15/2020.    ALLERGIES: No Known Allergies  VACCINATION STATUS:  There is no immunization history on file for this patient.  Diabetes He presents for his follow-up diabetic visit. He has type 2 diabetes mellitus. Onset time: He was diagnosed through approximate age of 31 years. His disease course has been improving. There are no hypoglycemic associated symptoms. Pertinent negatives for hypoglycemia include no headaches, seizures or tremors. Pertinent negatives for diabetes include no blurred vision, no chest pain, no polydipsia, no polyuria and no weight loss. There are no hypoglycemic complications. Symptoms are improving. Diabetic complications include nephropathy. Risk factors for coronary artery disease include dyslipidemia, diabetes mellitus, family history, male sex, obesity, hypertension, tobacco exposure and sedentary lifestyle. His weight is increasing steadily. He is following a generally unhealthy diet. When asked about meal planning, he reported none. He has not had a previous visit with a dietitian. He never participates in exercise. His home blood glucose trend is decreasing steadily. His breakfast blood glucose range is generally 130-140 mg/dl. His dinner blood glucose range is generally 130-140 mg/dl. His overall blood glucose range is 130-140 mg/dl. (He brought his meter meter and logs showing significant improvement in his glycemic profile.  His average blood glucose is 124 over the last 7 days, 133 for the last 14 days, 164 for the last 30 days.  This is significant improvement at home from his last visit average blood glucose of 331, 367, 385.  His A1c prior to last visit was 13.5%.  He did not document any  hypoglycemia.  ) An ACE inhibitor/angiotensin II receptor blocker is being taken. He does not see a podiatrist.Eye exam is not current.  Hyperlipidemia This is a chronic problem. The current episode started more than 1 year ago. Exacerbating diseases include chronic renal disease, diabetes and obesity. Pertinent negatives include no chest pain, myalgias or shortness of breath. Risk factors for coronary artery disease include dyslipidemia, diabetes mellitus, family history, obesity, male sex, hypertension and a sedentary lifestyle.  Hypertension This is a chronic problem. The current episode started more than 1 year  ago. Pertinent negatives include no blurred vision, chest pain, headaches, palpitations or shortness of breath. Risk factors for coronary artery disease include dyslipidemia, diabetes mellitus, family history, obesity, male gender, sedentary lifestyle and smoking/tobacco exposure. Past treatments include central alpha agonists and angiotensin blockers. Identifiable causes of hypertension include chronic renal disease.    Review of Systems  Constitutional:  Negative for chills, fever and weight loss.  Eyes:  Negative for blurred vision.  Respiratory:  Negative for cough and shortness of breath.   Cardiovascular:  Negative for chest pain and palpitations.       No Shortness of breath  Gastrointestinal:  Negative for abdominal pain, diarrhea, nausea and vomiting.  Endocrine: Negative for polydipsia and polyuria.  Genitourinary:  Negative for frequency, hematuria and urgency.  Musculoskeletal:  Negative for myalgias.  Skin:  Negative for rash.  Neurological:  Negative for tremors, seizures and headaches.  Hematological:  Does not bruise/bleed easily.  Psychiatric/Behavioral:  Negative for hallucinations and suicidal ideas.    Objective:    Vitals with BMI 08/15/2020 08/07/2020 07/31/2020  Height 6\' 0"  - 6\' 0"   Weight 425 lbs 6 oz 421 lbs 10 oz -  BMI 57.68 57.17 -  Systolic 138 150  166  Diastolic 86 84 118  Pulse 68 65 69    BP 138/86   Pulse 68   Ht 6' (1.829 m)   Wt (!) 425 lb 6.4 oz (193 kg)   BMI 57.69 kg/m   Wt Readings from Last 3 Encounters:  08/15/20 (!) 425 lb 6.4 oz (193 kg)  08/07/20 (!) 421 lb 9.6 oz (191.2 kg)  07/18/20 (!) 405 lb 9.6 oz (184 kg)     Physical Exam Constitutional:      General: He is not in acute distress.    Appearance: He is well-developed.  HENT:     Head: Normocephalic and atraumatic.     Mouth/Throat:     Mouth: Mucous membranes are dry.  Neck:     Thyroid: No thyromegaly.     Trachea: No tracheal deviation.  Cardiovascular:     Rate and Rhythm: Normal rate.     Pulses:          Dorsalis pedis pulses are 1+ on the right side and 1+ on the left side.       Posterior tibial pulses are 1+ on the right side and 1+ on the left side.     Heart sounds: S1 normal and S2 normal. No murmur heard.   No gallop.  Pulmonary:     Effort: Pulmonary effort is normal. No respiratory distress.     Breath sounds: No wheezing.  Abdominal:     General: Bowel sounds are normal. There is no distension.     Palpations: Abdomen is soft.     Tenderness: There is no abdominal tenderness. There is no guarding.  Musculoskeletal:     Right shoulder: No swelling or deformity.     Cervical back: Normal range of motion and neck supple.  Skin:    General: Skin is warm and dry.     Findings: No rash.     Nails: There is no clubbing.  Neurological:     Mental Status: He is alert and oriented to person, place, and time.     Cranial Nerves: No cranial nerve deficit.     Sensory: No sensory deficit.     Gait: Gait normal.     Deep Tendon Reflexes: Reflexes are normal and symmetric.  Psychiatric:  Speech: Speech normal.        Behavior: Behavior normal. Behavior is cooperative.        Thought Content: Thought content normal.        Judgment: Judgment normal.    Diabetic Labs (most recent): Lab Results  Component Value Date    HGBA1C 13.5 06/21/2020        Assessment & Plan:   1. Type 2 diabetes mellitus with stage 2 chronic kidney disease, with long-term current use of insulin (HCC)   - Melvin Ray has currently uncontrolled symptomatic type 2 DM since  35 years of age. Recent labs reviewed.   He brought his meter meter and logs showing significant improvement in his glycemic profile.  His average blood glucose is 124 over the last 7 days, 133 for the last 14 days, 164 for the last 30 days.  This is significant improvement at home from his last visit average blood glucose of 331, 367, 385.  His A1c prior to last visit was 13.5%.  He did not document any hypoglycemia.  - I had a long discussion with him about the progressive nature of diabetes and the pathology behind its complications. -his diabetes is complicated by CKD, obesity/sedentary life, smoking and he remains at a high risk for more acute and chronic complications which include CAD, CVA, CKD, retinopathy, and neuropathy. These are all discussed in detail with him.  - I have counseled him on diet  and weight management  by adopting a carbohydrate restricted/protein rich diet. Patient is encouraged to switch to  unprocessed or minimally processed     complex starch and increased protein intake (animal or plant source), fruits, and vegetables. -  he is advised to stick to a routine mealtimes to eat 3 meals  a day and avoid unnecessary snacks ( to snack only to correct hypoglycemia).   - he acknowledges that there is a room for improvement in his food and drink choices. - Suggestion is made for him to avoid simple carbohydrates  from his diet including Cakes, Sweet Desserts, Ice Cream, Soda (diet and regular), Sweet Tea, Candies, Chips, Cookies, Store Bought Juices, Alcohol in Excess of  1-2 drinks a day, Artificial Sweeteners,  Coffee Creamer, and "Sugar-free" Products, Lemonade. This will help patient to have more stable blood glucose profile and potentially  avoid unintended weight gain.   - he has been  scheduled with Norm Salt, RDN, CDE for diabetes education.  - I have approached him with the following individualized plan to manage  his diabetes and patient agrees:   -In light of his dramatic improvement in his glycemic profile, he will be allowed to stay on his Lantus but lowered to 60 units nightly.  He is advised to keep his NovoLog 70/30 in the fridge for later use.   -He is advised and willing to monitor blood glucose twice a day-daily before breakfast and at bedtime.     - he is encouraged to call clinic for blood glucose levels less than 70 or above 300 mg /dl.  - he is warned not to take insulin without proper monitoring per orders. -He will finish his Jardiance and stop. -  He is advised to increase metformin to 1000 mg XR p.o. daily after breakfast.   -He is also benefiting from glipizide.  He is advised to continue glipizide 10 mg XL p.o. daily at breakfast.   -He is counseled for  smoking cessation, he is too high risk to's give incretin therapy  due to his risk of pancreatitis. The patient was counseled on the dangers of tobacco use, and was advised to quit.  Reviewed strategies to maximize success, including removing cigarettes and smoking materials from environment.   - Specific targets for  A1c;  LDL, HDL,  and Triglycerides were discussed with the patient.  2) Blood Pressure /Hypertension:  his blood pressure is  Controlled  to target.   he is advised to continue his current medications including  chlorthalidone 25 mg p.o. daily, clonidine 0.1 mg p.o. twice daily, hydralazine 50 mg p.o. 3 times daily, losartan 100 mg p.o. every morning, metoprolol 200 mg p.o. daily.   Admittedly, he consumes salty food, advised to limit salt consumption.   3) Lipids/Hyperlipidemia: He does not have recent lipid panel to review.  He will have fasting lipid panel before next visit.      4)  Weight/Diet:  Body mass index is 57.69  kg/m.  -   clearly complicating his diabetes care.   he is  a candidate for modest weight loss. I discussed with him the fact that loss of 5 - 10% of his  current body weight will have the most impact on his diabetes management.  Exercise, and detailed carbohydrates information provided  -  detailed on discharge instructions.  5) Chronic Care/Health Maintenance:  -he  is on ARB medications and  is encouraged to initiate and continue to follow up with Ophthalmology, Dentist,  Podiatrist at least yearly or according to recommendations, and advised to  quit smoking. I have recommended yearly flu vaccine and pneumonia vaccine at least every 5 years; moderate intensity exercise for up to 150 minutes weekly; and  sleep for at least 7 hours a day.  - he is  advised to maintain close follow up with Waldon Reining, MD for primary care needs, as well as his other providers for optimal and coordinated care.     I spent 41 minutes in the care of the patient today including review of labs from CMP, Lipids, Thyroid Function, Hematology (current and previous including abstractions from other facilities); face-to-face time discussing  his blood glucose readings/logs, discussing hypoglycemia and hyperglycemia episodes and symptoms, medications doses, his options of short and long term treatment based on the latest standards of care / guidelines;  discussion about incorporating lifestyle medicine;  and documenting the encounter.    Please refer to Patient Instructions for Blood Glucose Monitoring and Insulin/Medications Dosing Guide"  in media tab for additional information. Please  also refer to " Patient Self Inventory" in the Media  tab for reviewed elements of pertinent patient history.  Micah Noel participated in the discussions, expressed understanding, and voiced agreement with the above plans.  All questions were answered to his satisfaction. he is encouraged to contact clinic should he have any questions or  concerns prior to his return visit.   Follow up plan: - Return in about 7 weeks (around 10/03/2020) for F/U with Pre-visit Labs, Meter, Logs, A1c here.Marquis Lunch, MD [facility] [address] [place], [place] [address] Phone: [phone]  Fax: [phone]    08/15/2020, 3:51 PM  This note was partially dictated with voice recognition software. Similar sounding words can be transcribed inadequately or may not  be corrected upon review.

## 2020-08-15 NOTE — Patient Instructions (Signed)

## 2020-08-21 NOTE — Progress Notes (Signed)
[facility] PREP Weekly Session   Patient Details  Name: Melvin Ray MRN: [id number] Date of Birth: 20-Mar-1985 Age: 35 y.o. PCP: Waldon Reining, MD  Vitals:   08/21/20 1509  Weight: (!) 425 lb (192.8 kg)     [name] Weekly seesion - 08/21/20 1500       Weekly Session   Topic Discussed Importance of resistance training;Other ways to be active    Minutes exercised this week 150 minutes    Classes attended to date 2              Melvin Ray B Melvin Ray 08/21/2020, 3:10 PM

## 2020-08-29 ENCOUNTER — Ambulatory Visit (INDEPENDENT_AMBULATORY_CARE_PROVIDER_SITE_OTHER): Payer: Medicaid Other

## 2020-08-29 ENCOUNTER — Other Ambulatory Visit: Payer: Self-pay

## 2020-08-29 ENCOUNTER — Telehealth: Payer: Self-pay

## 2020-08-29 DIAGNOSIS — Z0000 Encounter for general adult medical examination without abnormal findings: Secondary | ICD-10-CM

## 2020-08-29 NOTE — Telephone Encounter (Signed)
Called patient during schedule health coaching session. Was unable to leave patient a message because mailbox is not set up.

## 2020-08-29 NOTE — Progress Notes (Signed)
Appointment Outcome:  Completed, Session #: 4 Start time: 1:39pm   End time: 2:09pm   Total Mins: 30 minutes  AGREEMENTS SECTION   Overall Goal(s): Improve healthy eating behaviors Increase physical activity                                                 Agreement/Action Steps: Improve health eating behaviors Read food labels Track sodium with log (1500-2000 mg of sodium per day) Don't cook with salt Patient will follow dietary restrictions from Endocrinologist. No juice, soda, sugar, bread, or cakes/cookies   Increase physical activity Walk 2-3 days per week (30-45 minutes = about 1 mile) Patient will start walking to his nephew's house down the street or to the store (about one-mile round trip) between 6-7pm on Mon, Wed, and Fri. Perform bodyweight exercises 10 minutes per day  Progress Notes:  Patient is reading food labels of food that he purchases to keep mental track of how much sodium he consumes daily so he does not exceed 2000 mg. However, patient finds it challenging to track all his meals because he consumes fast food that he has delivered. Patient does not cook at home much. Transportation is a challenge to getting to the grocery store regularly to purchase healthy food options. Patient has not started using the Sodium Tracker sheets to monitor how much sodium he consumes with each meal, drink, and snack.   Patient struggles with following the dietary restrictions from his Endocrinologist. Patient has resumed drinking soda. Patient shared that he almost drunk a whole 2-liter Dr. Malachi Bonds yesterday. Patient has not been drinking a lot of water. Patient feels that he can get away with eating and drinking certain things because his blood glucose is improving and has recently ranged between 95-140. Patient shared that he does not eat in the am and typically eats no earlier than 4-6:00 pm. His last meal is between 12-1:00 pm.  Patient has been attending PREP. Patient mentioned that  he missed PREP on Tuesday. Patient stated that he feels good going to the class. Patient mentioned that they go over nutrition and perform cardio exercises on Tuesdays and do weightlifting on Thursdays. Patient stated that he uses the hour he arrives early for the class to exercise on those days.   Patient shared that he is walking five days a week. It was confirmed that walking to the local store is about 1-mile round trip. Patient mentioned that he hasn't walked on Saturdays and Sundays because he has been sore. Patient expressed that he is interested in walking those days as well. Patient stated that Zacarias Pontes demonstrated bodyweight/chair exercises that he can do at home. Patient shared that he performs these exercises while at PREP but have not started doing them at home yet.   Patient has had a discussion with nurse about eating habits and was suggested to eat some foods like a burger without the bun. Patient did not express interest in this idea because it's a boring way to eat a burger. Patient stated that he has a friend that tries to help him make healthy choices, but sometimes still gets what he wants.   Indicators of Success and Accountability:  Patient stated that getting out the house to engage in various activities (walking and PREP) is his indicator of success and accountability. Readiness: Patient is in the action phase  of improving healthy eating behaviors and increasing physical activity. Strengths and Supports: Patient is being supported by a friend, Marine scientist, and Education officer, museum. Patient is being optimistic and is motivated.  Challenges and Barriers: Patient's preferences with food may be challenged to following dietary restrictions.   Coaching Outcomes: Patient is interested in learning nutrition facts from fast food restaurants that he frequently eats from.  Patient is interested in scale to track his weight loss.  Patient was mailed copies of My Weekly Tracking sheets. Patient will work  with nurse on tracking sodium consumption on log sheets.   Patient will follow the steps as outlined above with the addition of attending PREP as scheduled over the next two weeks.    Attempted: Fulfilled - Patient is reading food labels and not cooking with any salt, patient is walking more than agreed during the week.  Partial - Patient is struggling with following dietary restrictions daily. Patient attended PREP 75% as scheduled.  Not met - Patient has not started recording sodium intake on log. Patient has not performed body/chair exercises at home yet.

## 2020-09-04 NOTE — Progress Notes (Signed)
[facility] PREP Weekly Session   Patient Details  Name: Melvin Ray MRN: [id number] Date of Birth: 10-01-1985 Age: 35 y.o. PCP: Waldon Reining, MD  There were no vitals filed for this visit.   [name] Weekly seesion - 09/04/20 1300       Weekly Session   Topic Discussed Health habits    Minutes exercised this week 40 minutes    Classes attended to date 5              [name] 09/04/2020, 2:00 PM

## 2020-09-04 NOTE — Progress Notes (Addendum)
Called [facility] to inquire about the referral made on patient's behalf on 5/31. Was transferred to [facility] and had to leave a message for her to return my call.   Hope returned phone call shortly after leaving message. Patient had not been contacted for scheduling because there is a long wait list and cannot schedule past Aug 11. Patient will be contacted asap when scheduling opens.

## 2020-09-06 LAB — LIPID PANEL
Cholesterol: 166 (ref 0–200)
HDL: 40 (ref 35–70)
LDL Cholesterol: 103
Triglycerides: 135 (ref 40–160)

## 2020-09-06 LAB — COMPREHENSIVE METABOLIC PANEL
Albumin: 3.9 (ref 3.5–5.0)
Calcium: 9.4 (ref 8.7–10.7)
Globulin: 3.1

## 2020-09-06 LAB — BASIC METABOLIC PANEL
BUN: 19 (ref 4–21)
CO2: 32 — AB (ref 13–22)
Chloride: 101 (ref 99–108)
Creatinine: 1.2 (ref 0.6–1.3)
Glucose: 133
Potassium: 3.7 (ref 3.4–5.3)
Sodium: 142 (ref 137–147)

## 2020-09-06 LAB — HEPATIC FUNCTION PANEL
ALT: 15 (ref 10–40)
AST: 12 — AB (ref 14–40)
Alkaline Phosphatase: 58 (ref 25–125)
Bilirubin, Total: 0.3

## 2020-09-06 LAB — TSH: TSH: 1.47 (ref 0.41–5.90)

## 2020-09-06 LAB — HEMOGLOBIN A1C: Hemoglobin A1C: 6.6

## 2020-09-11 NOTE — Progress Notes (Signed)
[facility] PREP Weekly Session   Patient Details  Name: Melvin Ray MRN: [id number] Date of Birth: 1985/03/10 Age: 35 y.o. PCP: Waldon Reining, MD  There were no vitals filed for this visit.   [name] Weekly seesion - 09/11/20 1400       Weekly Session   Topic Discussed Restaurant Eating   salt demo; reviewed food/nutrition labels brought in by each member for ingredients, sodium, serving size, added sugars to determine nutritional value of that food/product   Minutes exercised this week 200 minutes    Classes attended to date 7              Melisia Leming B Ziyan Hillmer 09/11/2020, 2:01 PM

## 2020-09-12 ENCOUNTER — Other Ambulatory Visit: Payer: Self-pay

## 2020-09-12 ENCOUNTER — Ambulatory Visit (INDEPENDENT_AMBULATORY_CARE_PROVIDER_SITE_OTHER): Payer: Medicaid Other

## 2020-09-12 DIAGNOSIS — Z0000 Encounter for general adult medical examination without abnormal findings: Secondary | ICD-10-CM

## 2020-09-12 NOTE — Progress Notes (Signed)
Appointment Outcome:  Completed, Session #: 5 Start time: 1:30pm   End time: 2:10pm   Total Mins: 40 minutes  AGREEMENTS SECTION   Overall Goal(s): Improve healthy eating behaviors Increase physical activity                                                 Agreement/Action Steps: Improve health eating behaviors Read food labels Track sodium with log (1500-2000 mg of sodium per day) Don't cook with salt Patient will follow dietary restrictions from Endocrinologist. No juice, soda, sugar, bread, or cakes/cookies   Increase physical activity Walk 2-3 days per week (30-45 minutes = about 1 mile) Patient will start walking to his nephew's house down the street or to the store (about one-mile round trip) between 6-7pm on Mon, Wed, and Fri. Perform bodyweight exercises 10 minutes per day  Progress Notes:  Patient has been attending PREP as scheduled on Tuesday and Thursday weekly. Patient stated that he walks on the treadmill for about 20 minutes each day. Patient reported that he is walking to the store and back unless he his sore the next day after PREP. Patient stated that he has to push himself at times to go to PREP (e.g., patient didn't feel like going to PREP yesterday, but used positive self-talk to encourage himself to get ready). Additionally, the patient is walking, bending, and lifting heavy items as exercise. Patient stated that it scared him when he weighed in at 430 lbs after recently weighing 424. Patient mentioned that his A1C has decreased to 6.5, which was checked about a week ago.  Patient went grocery shopping with a friend. However, he didn't read food labels at that time. Patient shared that in PREP class, he recently learned how to read food labels properly. Patient stated that a demonstration was given to show how much sodium content was in different foods using cups (e.g., [employer] sandwich, ketchup, yogurt covered raisins, and soup). Patient stated that he learned that  a can of soup was 1574m, but to break down the sodium content per serving size. Patient stated that he is learning how to make healthier food choices, because he learned that popcorn is better to eat than RLen Blalockcracker.   Patient shared that he has been eating out more. Patient stated that it's not because he doesn't know how to cook, but he has the money to eat so he doesn't cook. Patient hasn't made groceries for himself recently because he still has food in the house. Patient shared how he is practicing portion control by not eating an entire pizza in a day or one sitting. 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   Patient mentioned that he is drinking more water than soda. Patient reported that his glucose reading was 110 last night after eating 3 slices of pizza. Patient is having a hard time remembering to write down his sodium intake on the sodium tracker. Patient stated that he is trying to keep track mentally, but it is difficult to know how much sodium is in the fast food he eats.    Indicators of Success and Accountability:  Patient stated that getting up and going to PREP is his indicator of success.  Readiness: Patient is in the action phase of improving healthy eating behaviors and increasing physical activity.  Strengths and Supports: Patient is  being supported by his friend, Education officer, museum, and Marine scientist. Patient strength is becoming disciplined.  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   Coaching Outcomes: Patient was reminded of the nutritional fact sheets that were emailed to him for some restaurants that he eats at back home. Patient was informed that he can Google the nutrition facts for some  major chain restaurants and can use them for comparison for restaurants that serve similar food.   Patient was encouraged to try cooking at home  1-2x/week again and he agreed.   Patient was given a Regulatory affairs officer booklet to review to see if there were any physical or mental activities, he would like to engage in.   Patient's takeaway from today's session was that he truly desires to get in better shape and is determined to do so.   Patient will implement the update action steps below over the next two weeks.   Agreement/Action Steps: Improve health eating behaviors Read food labels Cook at home 1-2x/week Don't cook with salt Patient will follow dietary restrictions from Endocrinologist. No juice, soda, sugar, bread, or cakes/cookies   Increase physical activity Walk 2-3 days per week (30-45 minutes = about 1 mile) Patient will start walking to his nephew's house down the street or to the store (about one-mile round trip) between 6-7pm on Mon, Wed, and Fri. Perform bodyweight exercises 10 minutes per day  Attempted: Fulfilled - Patient has learned to read food labels in PREP class, has not cooked with salt, walking at least 2 times a week in addition to PREP, and performing body exercises daily.   Not met - Patient has not followed dietary restrictions from Endocrinologist.    Not attempted: Dropped/Revised - Patient will not use the Sodium Tracker sheets to monitor sodium intake.

## 2020-09-18 NOTE — Progress Notes (Signed)
[facility] PREP Weekly Session   Patient Details  Name: Melvin Ray MRN: [id number] Date of Birth: 1985/11/24 Age: 35 y.o. PCP: Waldon Reining, MD  There were no vitals filed for this visit.   [name] Weekly seesion - 09/18/20 1400       Weekly Session   Topic Discussed Stress management and problem solving    Minutes exercised this week 150 minutes    Classes attended to date 59              Adelina Collard B Chaney Maclaren 09/18/2020, 2:37 PM

## 2020-09-25 NOTE — Progress Notes (Signed)
[facility] PREP Weekly Session   Patient Details  Name: Melvin Ray MRN: [id number] Date of Birth: 28-Aug-1985 Age: 35 y.o. PCP: Waldon Reining, MD  There were no vitals filed for this visit.   [name] Weekly seesion - 09/25/20 1400       Weekly Session   Topic Discussed Expectations and non-scale victories    Minutes exercised this week 210 minutes    Classes attended to date 66              Christain Mcraney B Khiree Bukhari 09/25/2020, 2:37 PM

## 2020-09-26 ENCOUNTER — Other Ambulatory Visit: Payer: Self-pay | Admitting: "Endocrinology

## 2020-09-27 ENCOUNTER — Ambulatory Visit: Payer: Medicaid Other

## 2020-10-01 ENCOUNTER — Ambulatory Visit (INDEPENDENT_AMBULATORY_CARE_PROVIDER_SITE_OTHER): Payer: Medicaid Other

## 2020-10-01 ENCOUNTER — Other Ambulatory Visit: Payer: Self-pay

## 2020-10-01 DIAGNOSIS — Z0000 Encounter for general adult medical examination without abnormal findings: Secondary | ICD-10-CM

## 2020-10-01 NOTE — Progress Notes (Signed)
Appointment Outcome:  Completed, Session #: 6 Start time: 1:39pm   End time: 2:05pm   Total Mins: 26 minutes  AGREEMENTS SECTION   Overall Goal(s): Improve healthy eating behaviors Increase physical activity                                                 Agreement/Action Steps: Improve health eating behaviors Read all food labels Cook at home 1-2x/week Don't cook with salt Follow dietary restrictions from Endocrinologist No juice, soda, sugar, bread, or cakes/cookies   Increase physical activity Attend PREP as scheduled Walk 2-3 days per week (30-45 minutes = about 1 mile) Start walking to the store (about one-mile round trip) between 6-7pm on Mon, Wed, and Fri. Perform weight exercises at gym during PREP  Progress Notes:  Patient stated that he has been attending PREP as scheduled, however, he must make himself go because he hates the hour ride. Patient is engaged in lifting free weights during PREP on Thursdays. Patient performs cardio prior to lifting weights. Patient believes he just completed week 8 of the PREP program and have at least 4 weeks left.   Patient feels that he is dedicated to working out because he rescheduled his last health coaching session to ensure he made it to [place]. Patient stated that he is walking more than 2-3xs/week. Patient stated that he walks to the store and back 4xs/week. Patient stated that he is walking more for cardio.  Patient reported that he has been ordering out instead of cooking at home. Patient was asked what keeps him from cooking at home. Patient stated because he has the money to buy it. Patient stated that he has groceries, but he doesn't feel like cooking.   Patient stated that the things that he would cook that are easy and quick are not good for you like hamburger or tuna helper, or spaghetti. Patient stressed that he only eat twice a day. Patient shared that he ate from [employer] last night and his glucose was 107 at bedtime and 141  this morning. Patient mentioned that he did drink sweet tea. Patient stated that he really hasn't been drinking water that much lately. Patient shared that he drinks Gatorade with his sugar is low.   Indicators of Success and Accountability:  Patient stated that making himself go to the PREP program consistently is his indicator of success and accountability.  Readiness: Patient is in the action stage of improving healthy eating behaviors and increasing physical activity.  Strengths and Supports: Patient is being supported by his friend. Patient is dedicated and consistent to working out and was able to make a rational decision to ensure he was able to attend PREP as scheduled.  Challenges and Barriers: Patient challenges to improving healthy eating habits are not feeling like cooking and convenience and ordering out instead of grocery shopping regularly to cook at home.   Coaching Outcomes: Patient was ordered a Eat Smart weight scale (weight capacity 550 lbs.) and received it today during his visit. Patient will track his weight lost.   Patient stated that he is going grocery shopping tomorrow. He plans to eat salads and sandwiches, and fruit (e.g., apples). Patient is interested in meal planning and will conduct some research on how to start.   Patient understands the importance of following the dietary restrictions from his Endocrinologist.  Patient agreed to cooking at home some of the meals that he consumes throughout the week to cut back on ordering out.   Patient will continue to implement his action steps as outlined above.   Attempted: Fulfilled - Patient is attending PREP as scheduled, walking more than 2-3x/week to the store, and performing weight exercises at the gym using free weights.  Not met - Patient has not gone grocery shopping or cooking at home to read food labels. Patient is not following the dietary guidelines from Endocrinologist.

## 2020-10-02 NOTE — Progress Notes (Signed)
[facility] PREP Weekly Session   Patient Details  Name: Melvin Ray MRN: [id number] Date of Birth: 01-04-1986 Age: 35 y.o. PCP: Waldon Reining, MD  There were no vitals filed for this visit.   [name] Weekly seesion - 10/02/20 1400       Weekly Session   Topic Discussed Other   Portion Control, portion size demo; food label review   Minutes exercised this week 110 minutes    Classes attended to date 76              Melvin Ray 10/02/2020, 2:14 PM

## 2020-10-09 ENCOUNTER — Ambulatory Visit: Payer: Medicaid Other | Admitting: "Endocrinology

## 2020-10-09 NOTE — Progress Notes (Signed)
[facility] PREP Weekly Session   Patient Details  Name: Melvin Ray MRN: [id number] Date of Birth: [birth year] Age: 35 y.o. PCP: Waldon Reining, MD  There were no vitals filed for this visit.   [name] Weekly seesion - 10/09/20 1400       Weekly Session   Topic Discussed Finding support    Minutes exercised this week 160 minutes    Classes attended to date 97              Norvin Ohlin B Ashlei Chinchilla 10/09/2020, 2:12 PM

## 2020-10-10 ENCOUNTER — Other Ambulatory Visit: Payer: Self-pay

## 2020-10-10 ENCOUNTER — Encounter: Payer: Self-pay | Admitting: Podiatry

## 2020-10-10 ENCOUNTER — Ambulatory Visit (INDEPENDENT_AMBULATORY_CARE_PROVIDER_SITE_OTHER): Payer: Medicaid Other | Admitting: Podiatry

## 2020-10-10 DIAGNOSIS — M79674 Pain in right toe(s): Secondary | ICD-10-CM

## 2020-10-10 DIAGNOSIS — E1159 Type 2 diabetes mellitus with other circulatory complications: Secondary | ICD-10-CM | POA: Diagnosis not present

## 2020-10-10 DIAGNOSIS — B351 Tinea unguium: Secondary | ICD-10-CM | POA: Diagnosis not present

## 2020-10-10 DIAGNOSIS — M79675 Pain in left toe(s): Secondary | ICD-10-CM | POA: Diagnosis not present

## 2020-10-10 NOTE — Progress Notes (Signed)
This patient returns to my office for at risk foot care.  This patient requires this care by a professional since this patient will be at risk due to having diabetes.   This patient is unable to cut nails himself since the patient cannot reach his nails.These nails are painful walking and wearing shoes.  This patient presents for at risk foot care today.  He is brought to the office by caregiver from  [place].  General Appearance  Alert, conversant and in no acute stress.  Vascular  Dorsalis pedis and posterior tibial  pulses are not  palpable  Bilaterally due to swelling..  Capillary return is within normal limits  bilaterally. Temperature is within normal limits  bilaterally.  Neurologic  Senn-Weinstein monofilament wire test within normal limits  bilaterally. Muscle power within normal limits bilaterally.  Nails Thick disfigured discolored nails with subungual debris  from hallux to fifth toes bilaterally. No evidence of bacterial infection or drainage bilaterally.  Orthopedic  No limitations of motion  feet .  No crepitus or effusions noted.  No bony pathology or digital deformities noted.  Skin  normotropic skin with no porokeratosis noted bilaterally.  No signs of infections or ulcers noted.   Callus and fizzures developing sub 5th met right foot.    Onychomycosis  Pain in right toes  Pain in left toes  Consent was obtained for treatment procedures.   Mechanical debridement of nails 1-5  bilaterally performed with a nail nipper.  Filed with dremel without incident. Patient was told to utilize vaseline at the callus site with fizzures. Dremel tool used to thin callus.   Return office visit   3 months                 Told patient to return for periodic foot care and evaluation due to potential at risk complications.   Gardiner Barefoot [name]

## 2020-10-15 ENCOUNTER — Telehealth: Payer: Self-pay

## 2020-10-15 ENCOUNTER — Ambulatory Visit: Payer: Medicaid Other

## 2020-10-15 NOTE — Telephone Encounter (Signed)
Called patient to determine if they were in route to appointment or if they needed to reschedule/hold session over the phone. Was unable to leave a voice message due to mailbox not being setup.

## 2020-10-17 ENCOUNTER — Ambulatory Visit: Payer: Medicaid Other | Admitting: "Endocrinology

## 2020-10-22 ENCOUNTER — Other Ambulatory Visit: Payer: Self-pay | Admitting: Cardiovascular Disease

## 2020-10-30 NOTE — Progress Notes (Signed)
[facility] PREP Weekly Session   Patient Details  Name: Melvin Ray MRN: [id number] Date of Birth: 1986-02-10 Age: 35 y.o. PCP: Waldon Reining, MD  There were no vitals filed for this visit.   [name] Weekly seesion - 10/30/20 1400       Weekly Session   Topic Discussed Other   Fit testing completed, asked to completed goals and activity plan and PREP survey and bring to final assessment visit on Thursday.   Minutes exercised this week 150 minutes              Trevonne Nyland B Guillermo Difrancesco 10/30/2020, 2:53 PM

## 2020-11-01 NOTE — Progress Notes (Signed)
[facility] PREP Progress Report   Patient Details  Name: Melvin Ray MRN: [id number] Date of Birth: 1985-07-05 Age: 35 y.o. PCP: Waldon Reining, MD  Vitals:   11/01/20 1512  BP: 138/80  Pulse: 74  SpO2: 95%  Weight: (!) 449 lb 9.6 oz (203.9 kg)      [name] Eval - 11/01/20 1500       Mobility and Daily Activities   I find it easy to walk up or down two or more flights of stairs. 4    I have no trouble taking out the trash. 3    I do housework such as vacuuming and dusting on my own without difficulty. 1    I can easily lift a gallon of milk (8lbs). 4    I can easily walk a mile. 3    I have no trouble reaching into high cupboards or reaching down to pick up something from the floor. 4    I do not have trouble doing out-door work such as Loss adjuster, chartered, raking leaves, or gardening. 1      Mobility and Daily Activities   I feel younger than my age. 2    I feel independent. 4    I feel energetic. 3    I live an active life.  2    I feel strong. 4    I feel healthy. 2    I feel active as other people my age. 2      How fit and strong are you.   Fit and Strong Total Score 39            Past Medical History:  Diagnosis Date   Diabetes mellitus without complication (HCC)    Hypertension    Morbid obesity (HCC)    Obstructive sleep apnea    Onychomycosis    Tobacco abuse    Past Surgical History:  Procedure Laterality Date   APPENDECTOMY     Social History   Tobacco Use  Smoking Status Some Days   Types: Cigars  Smokeless Tobacco Former    Discussed wt gain of 22 lbs, eating from vending machines; discussed importance of continuing exercise program; very sleepy, states he's not sleeping; he reports he is having OSA work up [employer] completed: 8 Education sessions completed: 10  Natallie Ravenscroft B Kiyara Bouffard 11/01/2020, 3:14 PM

## 2020-11-07 ENCOUNTER — Ambulatory Visit (INDEPENDENT_AMBULATORY_CARE_PROVIDER_SITE_OTHER): Payer: Medicaid Other | Admitting: "Endocrinology

## 2020-11-07 ENCOUNTER — Encounter: Payer: Self-pay | Admitting: "Endocrinology

## 2020-11-07 ENCOUNTER — Other Ambulatory Visit: Payer: Self-pay

## 2020-11-07 VITALS — BP 156/82 | HR 68 | Ht 72.0 in | Wt >= 6400 oz

## 2020-11-07 DIAGNOSIS — N182 Chronic kidney disease, stage 2 (mild): Secondary | ICD-10-CM

## 2020-11-07 DIAGNOSIS — Z794 Long term (current) use of insulin: Secondary | ICD-10-CM

## 2020-11-07 DIAGNOSIS — E1122 Type 2 diabetes mellitus with diabetic chronic kidney disease: Secondary | ICD-10-CM | POA: Diagnosis not present

## 2020-11-07 DIAGNOSIS — I10 Essential (primary) hypertension: Secondary | ICD-10-CM | POA: Diagnosis not present

## 2020-11-07 DIAGNOSIS — E782 Mixed hyperlipidemia: Secondary | ICD-10-CM | POA: Diagnosis not present

## 2020-11-07 DIAGNOSIS — F17200 Nicotine dependence, unspecified, uncomplicated: Secondary | ICD-10-CM

## 2020-11-07 MED ORDER — CHOLECALCIFEROL 50 MCG (2000 UT) PO CAPS: 1.0000 | ORAL_CAPSULE | Freq: Every day | ORAL | 1 refills | Status: DC

## 2020-11-07 NOTE — Patient Instructions (Signed)

## 2020-11-07 NOTE — Progress Notes (Signed)
11/07/2020, 6:06 PM  Endocrinology follow-up note   Subjective:    Patient ID: Melvin Ray, male    DOB: [birth year].  Melvin Ray is being seen in follow up after he was sen in consultation for management of currently uncontrolled symptomatic diabetes requested by  Waldon Reining, MD.   Past Medical History:  Diagnosis Date   Diabetes mellitus without complication (HCC)    Hypertension    Morbid obesity (HCC)    Obstructive sleep apnea    Onychomycosis    Tobacco abuse     Past Surgical History:  Procedure Laterality Date   APPENDECTOMY      Social History   Socioeconomic History   Marital status: Single    Spouse name: Not on file   Number of children: Not on file   Years of education: Not on file   Highest education level: Not on file  Occupational History   Not on file  Tobacco Use   Smoking status: Some Days    Types: Cigars   Smokeless tobacco: Former  Building services engineer Use: Every day  Substance and Sexual Activity   Alcohol use: Never   Drug use: Never   Sexual activity: Not on file  Other Topics Concern   Not on file  Social History Narrative   Not on file   Social Determinants of Health   Financial Resource Strain: Low Risk    Difficulty of Paying Living Expenses: Not very hard  Food Insecurity: No Food Insecurity   Worried About Programme researcher, broadcasting/film/video in the Last Year: Never true   Ran Out of Food in the Last Year: Never true  Transportation Needs: Unmet Transportation Needs   Lack of Transportation (Medical): Yes   Lack of Transportation (Non-Medical): Yes  Physical Activity: Insufficiently Active   Days of Exercise per Week: 3 days   Minutes of Exercise per Session: 40 min  Stress: Not on file  Social Connections: Not on file    Family History  Problem Relation Age of Onset   Heart failure Father    Hypertension Father    Diabetes Maternal  Grandmother    Heart failure Paternal Grandmother        transplant    Outpatient Encounter Medications as of 11/07/2020  Medication Sig   Cholecalciferol 50 MCG (2000 UT) CAPS Take 1 capsule (2,000 Units total) by mouth daily with breakfast.   insulin glargine (LANTUS) 100 UNIT/ML Solostar Pen Inject 50 Units into the skin at bedtime.   ACCU-CHEK GUIDE test strip 3 (three) times daily.   Accu-Chek Softclix Lancets lancets 3 (three) times daily.   albuterol (VENTOLIN HFA) 108 (90 Base) MCG/ACT inhaler Inhale into the lungs every 6 (six) hours as needed for wheezing or shortness of breath.   chlorthalidone (HYGROTON) 25 MG tablet Take 1 tablet (25 mg total) by mouth daily.   cloNIDine (CATAPRES) 0.1 MG tablet Take 0.1 mg by mouth 2 (two) times daily.   diltiazem (CARDIZEM CD) 240 MG 24 hr capsule Take 240 mg by mouth daily.   FLUoxetine (PROZAC) 20 MG tablet Take  20 mg by mouth daily.   Fluticasone-Salmeterol (ADVAIR) 250-50 MCG/DOSE AEPB Inhale 1 puff into the lungs daily. (Patient not taking: Reported on 08/15/2020)   furosemide (LASIX) 20 MG tablet TAKE 1 TABLET ONCE DAILY.   glipiZIDE (GLUCOTROL XL) 10 MG 24 hr tablet Take 1 tablet (10 mg total) by mouth daily with breakfast.   hydrALAZINE (APRESOLINE) 50 MG tablet Take 1 tablet (50 mg total) by mouth 3 (three) times daily.   ibuprofen (ADVIL) 400 MG tablet Take 400 mg by mouth 3 (three) times daily.   metFORMIN (GLUMETZA) 1000 MG (MOD) 24 hr tablet Take 1 tablet (1,000 mg total) by mouth daily with breakfast.   metoprolol (TOPROL-XL) 200 MG 24 hr tablet Take 200 mg by mouth daily.   pantoprazole (PROTONIX) 40 MG tablet Take 40 mg by mouth daily.   potassium chloride (KLOR-CON) 10 MEQ tablet Take 10 mEq by mouth daily.   traZODone (DESYREL) 100 MG tablet Take 200 mg by mouth at bedtime.    valsartan (DIOVAN) 320 MG tablet Take 320 mg by mouth daily.   [DISCONTINUED] insulin glargine (LANTUS SOLOSTAR) 100 UNIT/ML Solostar Pen Inject 60  Units into the skin at bedtime.   No facility-administered encounter medications on file as of 11/07/2020.    ALLERGIES: No Known Allergies  VACCINATION STATUS:  There is no immunization history on file for this patient.  Diabetes He presents for his follow-up diabetic visit. He has type 2 diabetes mellitus. Onset time: He was diagnosed through approximate age of 31 years. His disease course has been improving. There are no hypoglycemic associated symptoms. Pertinent negatives for hypoglycemia include no headaches, seizures or tremors. Pertinent negatives for diabetes include no blurred vision, no chest pain, no polydipsia, no polyuria and no weight loss. There are no hypoglycemic complications. Symptoms are improving. Diabetic complications include nephropathy. Risk factors for coronary artery disease include dyslipidemia, diabetes mellitus, family history, male sex, obesity, hypertension, tobacco exposure and sedentary lifestyle. His weight is increasing steadily. He is following a generally unhealthy diet. When asked about meal planning, he reported none. He has not had a previous visit with a dietitian. He never participates in exercise. His home blood glucose trend is decreasing steadily. His breakfast blood glucose range is generally 130-140 mg/dl. His dinner blood glucose range is generally 130-140 mg/dl. His overall blood glucose range is 130-140 mg/dl. (He presents with his meter showing average blood glucose of 162 over the last 30 days.  His A1c in July was 6.6% significantly improving from 13.5% in his prior measurement.  He has no hypoglycemia documented or reported.     ) An ACE inhibitor/angiotensin II receptor blocker is being taken. He does not see a podiatrist.Eye exam is not current.  Hyperlipidemia This is a chronic problem. The current episode started more than 1 year ago. Exacerbating diseases include chronic renal disease, diabetes and obesity. Pertinent negatives include no  chest pain, myalgias or shortness of breath. Risk factors for coronary artery disease include dyslipidemia, diabetes mellitus, family history, obesity, male sex, hypertension and a sedentary lifestyle.  Hypertension This is a chronic problem. The current episode started more than 1 year ago. Pertinent negatives include no blurred vision, chest pain, headaches, palpitations or shortness of breath. Risk factors for coronary artery disease include dyslipidemia, diabetes mellitus, family history, obesity, male gender, sedentary lifestyle and smoking/tobacco exposure. Past treatments include central alpha agonists and angiotensin blockers. Identifiable causes of hypertension include chronic renal disease.    Review of Systems  Constitutional:  Negative for chills, fever and weight loss.  Eyes:  Negative for blurred vision.  Respiratory:  Negative for cough and shortness of breath.   Cardiovascular:  Negative for chest pain and palpitations.       No Shortness of breath  Gastrointestinal:  Negative for abdominal pain, diarrhea, nausea and vomiting.  Endocrine: Negative for polydipsia and polyuria.  Genitourinary:  Negative for frequency, hematuria and urgency.  Musculoskeletal:  Negative for myalgias.  Skin:  Negative for rash.  Neurological:  Negative for tremors, seizures and headaches.  Hematological:  Does not bruise/bleed easily.  Psychiatric/Behavioral:  Negative for hallucinations and suicidal ideas.    Objective:    Vitals with BMI 11/07/2020 11/01/2020 08/21/2020  Height 6\' 0"  - -  Weight 450 lbs 449 lbs 10 oz 425 lbs  BMI 61.02 - 57.63  Systolic 156 138 -  Diastolic 82 80 -  Pulse 68 74 -    BP (!) 156/82   Pulse 68   Ht 6' (1.829 m)   Wt (!) 450 lb (204.1 kg)   BMI 61.03 kg/m   Wt Readings from Last 3 Encounters:  11/07/20 (!) 450 lb (204.1 kg)  11/01/20 (!) 449 lb 9.6 oz (203.9 kg)  08/21/20 (!) 425 lb (192.8 kg)     Physical Exam Constitutional:      General: He is not  in acute distress.    Appearance: He is well-developed.  HENT:     Head: Normocephalic and atraumatic.     Mouth/Throat:     Mouth: Mucous membranes are dry.  Neck:     Thyroid: No thyromegaly.     Trachea: No tracheal deviation.  Cardiovascular:     Rate and Rhythm: Normal rate.     Pulses:          Dorsalis pedis pulses are 1+ on the right side and 1+ on the left side.       Posterior tibial pulses are 1+ on the right side and 1+ on the left side.     Heart sounds: S1 normal and S2 normal. No murmur heard.   No gallop.  Pulmonary:     Effort: Pulmonary effort is normal. No respiratory distress.     Breath sounds: No wheezing.  Abdominal:     General: Bowel sounds are normal. There is no distension.     Palpations: Abdomen is soft.     Tenderness: There is no abdominal tenderness. There is no guarding.  Musculoskeletal:     Right shoulder: No swelling or deformity.     Cervical back: Normal range of motion and neck supple.  Skin:    General: Skin is warm and dry.     Findings: No rash.     Nails: There is no clubbing.  Neurological:     Mental Status: He is alert and oriented to person, place, and time.     Cranial Nerves: No cranial nerve deficit.     Sensory: No sensory deficit.     Gait: Gait normal.     Deep Tendon Reflexes: Reflexes are normal and symmetric.  Psychiatric:        Speech: Speech normal.        Behavior: Behavior normal. Behavior is cooperative.        Thought Content: Thought content normal.        Judgment: Judgment normal.    Diabetic Labs (most recent): Lab Results  Component Value Date   HGBA1C 6.6 09/06/2020   HGBA1C 13.5 06/21/2020  Recent Results (from the past 2160 hour(s))  Basic metabolic panel     Status: Abnormal   Collection Time: 09/06/20 12:00 AM  Result Value Ref Range   Glucose 133    BUN 19 4 - 21   CO2 32 (A) 13 - 22   Creatinine 1.2 0.6 - 1.3   Potassium 3.7 3.4 - 5.3   Sodium 142 137 - 147   Chloride 101 99 -  108  Comprehensive metabolic panel     Status: None   Collection Time: 09/06/20 12:00 AM  Result Value Ref Range   Globulin 3.1    Calcium 9.4 8.7 - 10.7   Albumin 3.9 3.5 - 5.0  Lipid panel     Status: None   Collection Time: 09/06/20 12:00 AM  Result Value Ref Range   Triglycerides 135 40 - 160   Cholesterol 166 0 - 200   HDL 40 35 - 70   LDL Cholesterol 103   Hepatic function panel     Status: Abnormal   Collection Time: 09/06/20 12:00 AM  Result Value Ref Range   Alkaline Phosphatase 58 25 - 125   ALT 15 10 - 40   AST 12 (A) 14 - 40   Bilirubin, Total 0.3   Hemoglobin A1c     Status: None   Collection Time: 09/06/20 12:00 AM  Result Value Ref Range   Hemoglobin A1C 6.6   TSH     Status: None   Collection Time: 09/06/20 12:00 AM  Result Value Ref Range   TSH 1.47 0.41 - 5.90     Assessment & Plan:   1. Type 2 diabetes mellitus with stage 2 chronic kidney disease, with long-term current use of insulin (HCC)   - Natan Hartog has currently uncontrolled symptomatic type 2 DM since  35 years of age. Recent labs reviewed.   He presents with his meter showing average blood glucose of 162 over the last 30 days.  His A1c in July was 6.6% significantly improving from 13.5% in his prior measurement.  He has no hypoglycemia documented or reported.    - I had a long discussion with him about the progressive nature of diabetes and the pathology behind its complications. -his diabetes is complicated by CKD, obesity/sedentary life, smoking and he remains at a high risk for more acute and chronic complications which include CAD, CVA, CKD, retinopathy, and neuropathy. These are all discussed in detail with him.  - I have counseled him on diet  and weight management  by adopting a carbohydrate restricted/protein rich diet. Patient is encouraged to switch to  unprocessed or minimally processed     complex starch and increased protein intake (animal or plant source), fruits, and  vegetables. -  he is advised to stick to a routine mealtimes to eat 3 meals  a day and avoid unnecessary snacks ( to snack only to correct hypoglycemia).  -He has regained most of the weight he lost when he was dealing with severe hyperglycemia with glycosuria. - he acknowledges that there is a room for improvement in his food and drink choices. - Suggestion is made for him to avoid simple carbohydrates  from his diet including Cakes, Sweet Desserts, Ice Cream, Soda (diet and regular), Sweet Tea, Candies, Chips, Cookies, Store Bought Juices, Alcohol in Excess of  1-2 drinks a day, Artificial Sweeteners,  Coffee Creamer, and "Sugar-free" Products, Lemonade. This will help patient to have more stable blood glucose profile and potentially avoid unintended  weight gain.   - he has been  scheduled with Norm Salt, RDN, CDE for diabetes education.  - I have approached him with the following individualized plan to manage  his diabetes and patient agrees:   -In light of his dramatic improvement in his glycemic profile, he will be allowed to lower his Lantus to 50 units nightly, associated with monitoring of blood glucose at least twice a day-daily before breakfast and at bedtime.     - he is encouraged to call clinic for blood glucose levels less than 70 or above 200 mg /dl.  - he is warned not to take insulin without proper monitoring per orders.  -  He is advised to increase metformin to 1000 mg XR p.o. daily after breakfast.   -He is also benefiting from glipizide.  He is advised to continue glipizide 10 mg XL p.o. daily at breakfast.   -He is counseled for  smoking cessation, he is too high risk to's give incretin therapy due to his risk of pancreatitis. Tragically, patient continues to smoke.  He has obstructive sleep apnea would benefit the most from weight loss and smoking cessation. The patient was counseled on the dangers of tobacco use, and was advised to quit.  Reviewed strategies to  maximize success, including removing cigarettes and smoking materials from environment.   - Specific targets for  A1c;  LDL, HDL,  and Triglycerides were discussed with the patient.  2) Blood Pressure /Hypertension: His blood pressure is not controlled to target.  he is advised to continue his current medications including  chlorthalidone 25 mg p.o. daily, clonidine 0.1 mg p.o. twice daily, hydralazine 50 mg p.o. 3 times daily, losartan 100 mg p.o. every morning, metoprolol 200 mg p.o. daily.   Admittedly, he consumes salty food, advised to limit salt consumption.   3) Lipids/Hyperlipidemia: Recent lipid panel showed LDL at 103, triglycerides at 135.  He will be considered for statin intervention during his next visit.  4)  Weight/Diet:  Body mass index is 61.03 kg/m.  -   clearly complicating his diabetes care.   he is  a candidate for modest weight loss. I discussed with him the fact that loss of 5 - 10% of his  current body weight will have the most impact on his diabetes management.  Exercise, and detailed carbohydrates information provided  -  detailed on discharge instructions.  5) Chronic Care/Health Maintenance:  -he  is on ARB medications and  is encouraged to initiate and continue to follow up with Ophthalmology, Dentist,  Podiatrist at least yearly or according to recommendations, and advised to  quit smoking. I have recommended yearly flu vaccine and pneumonia vaccine at least every 5 years; moderate intensity exercise for up to 150 minutes weekly; and  sleep for at least 7 hours a day.  - he is  advised to maintain close follow up with Waldon Reining, MD for primary care needs, as well as his other providers for optimal and coordinated care.   I spent 45 minutes in the care of the patient today including review of labs from CMP, Lipids, Thyroid Function, Hematology (current and previous including abstractions from other facilities); face-to-face time discussing  his blood glucose  readings/logs, discussing hypoglycemia and hyperglycemia episodes and symptoms, medications doses, his options of short and long term treatment based on the latest standards of care / guidelines;  discussion about incorporating lifestyle medicine;  and documenting the encounter.    Please refer to Patient Instructions for  Blood Glucose Monitoring and Insulin/Medications Dosing Guide"  in media tab for additional information. Please  also refer to " Patient Self Inventory" in the Media  tab for reviewed elements of pertinent patient history.  Melvin Ray participated in the discussions, expressed understanding, and voiced agreement with the above plans.  All questions were answered to his satisfaction. he is encouraged to contact clinic should he have any questions or concerns prior to his return visit.    Follow up plan: - Return in about 3 months (around 02/06/2021) for F/U with Pre-visit Labs, Meter, Logs, A1c here.Marquis Lunch, MD [facility] [address] [place], [place] [address] Phone: [phone]  Fax: [phone]    11/07/2020, 6:06 PM  This note was partially dictated with voice recognition software. Similar sounding words can be transcribed inadequately or may not  be corrected upon review.

## 2020-11-12 ENCOUNTER — Telehealth: Payer: Self-pay

## 2020-11-12 DIAGNOSIS — Z0000 Encounter for general adult medical examination without abnormal findings: Secondary | ICD-10-CM

## 2020-11-12 NOTE — Telephone Encounter (Signed)
Patient called in to reschedule health coaching session that was missed. Patient has been rescheduled for 9/29 at 1:30pm for an in-person health coaching session.    Albina Gosney [facility] Guide, Health Coach [address] [place] [address] Telephone: [phone] Email: [email or web address]

## 2020-11-20 ENCOUNTER — Ambulatory Visit (INDEPENDENT_AMBULATORY_CARE_PROVIDER_SITE_OTHER): Payer: Medicaid Other | Admitting: Cardiology

## 2020-11-20 ENCOUNTER — Encounter: Payer: Self-pay | Admitting: Cardiology

## 2020-11-20 VITALS — BP 162/112 | HR 67 | Ht 72.0 in | Wt >= 6400 oz

## 2020-11-20 DIAGNOSIS — R600 Localized edema: Secondary | ICD-10-CM

## 2020-11-20 DIAGNOSIS — I471 Supraventricular tachycardia: Secondary | ICD-10-CM | POA: Diagnosis not present

## 2020-11-20 DIAGNOSIS — I10 Essential (primary) hypertension: Secondary | ICD-10-CM

## 2020-11-20 NOTE — Progress Notes (Signed)
Clinical Summary Mr. Dirr is a 35 y.o.male seen today for follow up of the following medical problems.   1. Leg edema 12/2019 echo: LVEF 60-65%. No WMAs, indet diastolic fxn, normal RV -some improvement in swelling - compliant with meds      2. OSA - sleeps on bipap   3. HTN -did not take meds today  - needs aldo/renin levels  -2011 renal artery [facility]: no renal artery stenosis - has known OSA on bipap - home bp's 150s/90s   3. SVT - he had thought he had episode of afib during prior admission few years ago.  [facility] records 01/2019 indicate SVT during that admission, not afib. This was in the setting of DKA and hypokalemia.  - no recent palpitations. Has been on toprol, diltiazem    Past Medical History:  Diagnosis Date   Diabetes mellitus without complication (HCC)    Hypertension    Morbid obesity (HCC)    Obstructive sleep apnea    Onychomycosis    Tobacco abuse      No Known Allergies   Current Outpatient Medications  Medication Sig Dispense Refill   ACCU-CHEK GUIDE test strip 3 (three) times daily.     Accu-Chek Softclix Lancets lancets 3 (three) times daily.     albuterol (VENTOLIN HFA) 108 (90 Base) MCG/ACT inhaler Inhale into the lungs every 6 (six) hours as needed for wheezing or shortness of breath.     chlorthalidone (HYGROTON) 25 MG tablet Take 1 tablet (25 mg total) by mouth daily. 30 tablet 6   Cholecalciferol 50 MCG (2000 UT) CAPS Take 1 capsule (2,000 Units total) by mouth daily with breakfast. 90 capsule 1   cloNIDine (CATAPRES) 0.1 MG tablet Take 0.1 mg by mouth 2 (two) times daily.     diltiazem (CARDIZEM CD) 240 MG 24 hr capsule Take 240 mg by mouth daily.     FLUoxetine (PROZAC) 20 MG tablet Take 20 mg by mouth daily.     Fluticasone-Salmeterol (ADVAIR) 250-50 MCG/DOSE AEPB Inhale 1 puff into the lungs daily. (Patient not taking: Reported on 08/15/2020)     furosemide (LASIX) 20 MG tablet TAKE 1 TABLET ONCE DAILY. 90 tablet 1    glipiZIDE (GLUCOTROL XL) 10 MG 24 hr tablet Take 1 tablet (10 mg total) by mouth daily with breakfast. 90 tablet 1   hydrALAZINE (APRESOLINE) 50 MG tablet Take 1 tablet (50 mg total) by mouth 3 (three) times daily. 270 tablet 3   ibuprofen (ADVIL) 400 MG tablet Take 400 mg by mouth 3 (three) times daily.     insulin glargine (LANTUS) 100 UNIT/ML Solostar Pen Inject 50 Units into the skin at bedtime.     metFORMIN (GLUMETZA) 1000 MG (MOD) 24 hr tablet Take 1 tablet (1,000 mg total) by mouth daily with breakfast. 90 tablet 1   metoprolol (TOPROL-XL) 200 MG 24 hr tablet Take 200 mg by mouth daily.     pantoprazole (PROTONIX) 40 MG tablet Take 40 mg by mouth daily.     potassium chloride (KLOR-CON) 10 MEQ tablet Take 10 mEq by mouth daily.     traZODone (DESYREL) 100 MG tablet Take 200 mg by mouth at bedtime.      valsartan (DIOVAN) 320 MG tablet Take 320 mg by mouth daily.     No current facility-administered medications for this visit.     Past Surgical History:  Procedure Laterality Date   APPENDECTOMY       No  Known Allergies    Family History  Problem Relation Age of Onset   Heart failure Father    Hypertension Father    Diabetes Maternal Grandmother    Heart failure Paternal Grandmother        transplant     Social History Mr. Cardiff reports that he has been smoking cigars. He has quit using smokeless tobacco. Mr. Alemu reports no history of alcohol use.   Review of Systems CONSTITUTIONAL: No weight loss, fever, chills, weakness or fatigue.  HEENT: Eyes: No visual loss, blurred vision, double vision or yellow sclerae.No hearing loss, sneezing, congestion, runny nose or sore throat.  SKIN: No rash or itching.  CARDIOVASCULAR: per hpi RESPIRATORY: No shortness of breath, cough or sputum.  GASTROINTESTINAL: No anorexia, nausea, vomiting or diarrhea. No abdominal pain or blood.  GENITOURINARY: No burning on urination, no polyuria NEUROLOGICAL: No headache, dizziness,  syncope, paralysis, ataxia, numbness or tingling in the extremities. No change in bowel or bladder control.  MUSCULOSKELETAL: No muscle, back pain, joint pain or stiffness.  LYMPHATICS: No enlarged nodes. No history of splenectomy.  PSYCHIATRIC: No history of depression or anxiety.  ENDOCRINOLOGIC: No reports of sweating, cold or heat intolerance. No polyuria or polydipsia.  Marland Kitchen   Physical Examination Today's Vitals   11/20/20 1513  BP: (!) 162/112  Pulse: 67  SpO2: 96%  Weight: (!) 451 lb 3.2 oz (204.7 kg)  Height: 6' (1.829 m)   Body mass index is 61.19 kg/m.  Gen: resting comfortably, no acute distress HEENT: no scleral icterus, pupils equal round and reactive, no palptable cervical adenopathy,  CV: RRR, no mr/g no jvd Resp: Clear to auscultation bilaterally GI: abdomen is soft, non-tender, non-distended, normal bowel sounds, no hepatosplenomegaly MSK: extremities are warm, no edema.  Skin: warm, no rash Neuro:  no focal deficits Psych: appropriate affect   Diagnostic Studies 12/2019 echo IMPRESSIONS     1. Left ventricular ejection fraction, by estimation, is 60 to 65%. The  left ventricle has normal function. The left ventricle has no regional  wall motion abnormalities. The left ventricular internal cavity size was  mildly dilated. There is mild left  ventricular hypertrophy. Left ventricular diastolic parameters are  indeterminate.   2. Right ventricular systolic function is normal. The right ventricular  size is normal. Tricuspid regurgitation signal is inadequate for assessing  PA pressure.   3. The mitral valve is grossly normal. Trivial mitral valve  regurgitation.   4. The aortic valve is tricuspid. Aortic valve regurgitation is not  visualized.   5. The inferior vena cava is dilated in size with >50% respiratory  variability, suggesting right atrial pressure of 8 mmHg.     Assessment and Plan  1. Leg edema/SOB - LE edema improved.  - echo overall  benign, normal LVEF, some LVH indet diastolic function - continue chlorthalidone and lasix.    2. HTN - resistant HTN - prior renal artery [place] was benign.  - he has known OSA on bipap - needs renin/aldo levels. Once back would start aldactone 25mg  daily for ongoing HTN - if bp remains refractory could change toprol to labetalol   3. SVT - episdoe few years ago in hospital in setting of DKA and hypokalemia - no recent symptoms - he is on toprol, diltiazem      , M.D.

## 2020-11-20 NOTE — Patient Instructions (Addendum)
Medication Instructions:  Continue all current medications.  Labwork: Renin/Aldosterone Ratio, BMET, Mg - orders given today.  Office will contact with results via phone or letter.     Testing/Procedures: none  Follow-Up: 2 months   Any Other Special Instructions Will Be Listed Below (If Applicable).   If you need a refill on your cardiac medications before your next appointment, please call your pharmacy.

## 2020-11-21 ENCOUNTER — Other Ambulatory Visit: Payer: Self-pay | Admitting: Cardiology

## 2020-11-21 ENCOUNTER — Other Ambulatory Visit: Payer: Self-pay | Admitting: "Endocrinology

## 2020-11-22 ENCOUNTER — Other Ambulatory Visit: Payer: Self-pay

## 2020-11-22 ENCOUNTER — Ambulatory Visit (INDEPENDENT_AMBULATORY_CARE_PROVIDER_SITE_OTHER): Payer: Medicaid Other

## 2020-11-22 ENCOUNTER — Other Ambulatory Visit: Payer: Self-pay | Admitting: "Endocrinology

## 2020-11-22 DIAGNOSIS — Z0000 Encounter for general adult medical examination without abnormal findings: Secondary | ICD-10-CM

## 2020-11-22 NOTE — Progress Notes (Signed)
Appointment Outcome: Completed, Session #: 7 Start time: 1:37pm   End time: 2:10pm   Total Mins: 33 minutes  AGREEMENTS SECTION   Overall Goal(s): Improve healthy eating behaviors Increase physical activity                                                 Agreement/Action Steps: Improve health eating behaviors Read all food labels Cook at home 1-2x/week Don't cook with salt Follow dietary restrictions from Endocrinologist No juice, soda, sugar, bread, or cakes/cookies   Increase physical activity Walk 2-3 days per week (30-45 minutes = about 1 mile) Start walking to the store (about one-mile round trip) between 6-7pm on Mon, Wed, and Fri.   Progress Notes:  Patient has not been following dietary restrictions from his Endocrinologist. Patient stated that he eats whatever he can, so he doesn't go hungry. Patient shared that he recently ate frozen pizzas, noodles, and [employer] chicken. Patient stated that he is reading food labels because he cooks at home as agreed and is trying to monitor is sugar and sodium intake.   Patient is not using salt while cooking. Patient stated that his major challenge to eating healthy is transportation to the grocery store. Patient feels that he doesn't have good options to choose from.   Patient has completed PREP but continues to walk to the convenient store 2-3 times per week. Patient expressed his lack of interest in going to the [facility] in [place].   Indicators of Success and Accountability:  Patient continues to walk after finishing PREP. Readiness: Patient is in the action stage of improving healthy eating behaviors and increasing physical activity.  Strengths and Supports: Patient is being supported by a friend. Patient is open-minded to learning things that he can do to improve his health.  Challenges and Barriers: Not having transportation is a challenge to healthy eating.   Coaching Outcomes: Patient discussed strategies with health coach in which he  can make some changes to his diet by reducing sugar and sodium intake and including more vegetables. Patient will be working with his Education officer, museum to get a ride to the grocery store to shop.   Patient was provided a copy of the glycemic index and information on meal planning and prepping for review. Patient was given food journal/grocery logs to help with meal planning and prepping. Patient will track his progress and challenges with implementing his action steps on the My Weekly Goal Tracker. Health coach discussed the materials with the patient to help him understand what the information and tools were for.   Patient will follow the action steps as outlined below for the next three weeks.    Agreement/Action Steps: Improve health eating behaviors Read all food labels Cook at home 1-2x/week Don't cook with salt Follow dietary restrictions from Endocrinologist No juice, soda, sugar, bread, or cakes/cookies   Increase physical activity Walk 2-3 days per week (30-45 minutes = about 1 mile) Start walking to the store (about one-mile round trip) between 6-7pm on Mon, Wed, and Fri Walk 15-20 minutes on Mon, Wed, and Fri around neighborhood    Attempted: Fulfilled - Patient is cooking at home as agreed, reading food labels, doesn't cook with salt, and is walking to the store 2-3 days per week.   Not met - Patient did not follow dietary guidelines from his Endocrinologist

## 2020-12-13 ENCOUNTER — Ambulatory Visit (INDEPENDENT_AMBULATORY_CARE_PROVIDER_SITE_OTHER): Payer: Medicaid Other

## 2020-12-13 ENCOUNTER — Other Ambulatory Visit: Payer: Self-pay

## 2020-12-13 ENCOUNTER — Telehealth: Payer: Self-pay

## 2020-12-13 DIAGNOSIS — Z0000 Encounter for general adult medical examination without abnormal findings: Secondary | ICD-10-CM

## 2020-12-13 NOTE — Progress Notes (Signed)
Appointment Outcome:  Completed, Session #: 8 Start time: 2:04pm   End time: 2:37pm   Total Mins: 33 minutes  AGREEMENTS SECTION    Overall Goal(s): Improve healthy eating behaviors Increase physical activity  Agreement/Action Steps:  Improve health eating behaviors Read all food labels Cook at home 1-2x/week Don't cook with salt Follow dietary restrictions from Endocrinologist No juice, soda, sugar, bread, or cakes/cookies  Increase physical activity Walk 2-3 days/week (30-45 minutes = approx. 1 mile) Walk to the store (approx. 1-mile round trip) Walk 15-20 minutes Mon, Wed, Fri around neighborhood   Progress Notes:  Patient stated that he has been walking to the store twice a week. Patient mentioned that he walked to the store yesterday as well. Patient stated that he needed things from the store which help motivated him to walk. Patient hasn't walked outside of going to the store because of rainy days and it being cold. Patient stated that he is more interested in going to the gym than walking extra days. Patient stated that his challenge to going to the gym as he would like is having to pay someone to take him there and back.  Patient reported that he has been to the grocery store three times in the past two weeks. Patient stated that he had to pay someone to take him each time. Patient stated that he only picked up a few items each time (e.g., frozen pizza, chicken, water, chuck roast, hot pockets, and canned spaghetti). Patient stated that he eats the same foods on a regular basis because there are not a lot of healthy choices at the grocery store.   Patient mentioned that he cannot eat fruit because of the sugar content and that he feels he is restricted to the point where he can't eat anything, so he eats what he can but not a lot of it. Patient stated that he only read food labels on things that he doesn't normally eat. Patient shared that he paid someone to cook his roast  with celery, carrots, and onions. Patient stated that he ate that for a few days following the idea of meal prepping. Patient shared that he still eats 1-2 times per day.  Patient shared that he has been heating up foods instead of doing a lot of cooking such as the hot pockets and canned spaghetti. Patient mentioned that he is drinking more water (1/2 - 1 gallon/day) and has reduced his soda consumption from 2 liters/day to 1 liter/day.   Indicators of Success and Accountability:  Patient stated that not eating a lot in general is his indicator of success and accountability.  Readiness: Patient is in the action stage of improving healthy eating behaviors and increasing physical activity.  Strengths and Supports: Patient is being supported by friends. Patient motivation has increased to improve health and to save money.  Challenges and Barriers: Patient is limited to healthy food options at the stores he shops at, transportation, and the change of weather.   Coaching Outcomes: Patient is interested in going to the gym at least twice a week. Reyne Dumas (Education officer, museum) was interested in knowing if there was a benefit with his Well [employer] Medicaid that would assist the patient in getting gym membership at the [facility] in [place]. Patient would be able to ride [employer] on Mon, Wed, Fri to get to the gym between 1-3pm.   Health coach will follow up on benefits that patient may have to assist with gym membership coverage. If he does  not have any will determine if the gym membership can be partially covered by the Patient Care Fund.   Patient was provided educational material on diabetes and nutrition.  Patient will start to read all food labels to check serving size, sugar, sodium, and carb content of the foods that he eats.   Patient will continue to implement action steps as outlined above over the next two weeks.    Attempted: Fulfilled - Patient has been cooking at home 1-2x/week, doesn't cook with salt, and  is walking to the store 2 days per week.  Partial - Patient has reduce consumption of dietary restrictions but have not eliminated them from diet. Patient only reads food labels on items that he hasn't purchased before.  Not met - Patient did not start walking 15-20 minutes on Mon, Wed, Fri around the neighborhood.

## 2020-12-13 NOTE — Telephone Encounter (Signed)
Called patient to determine if they were in route to the health coaching appointment. Unable to leave message because mailbox is not set up.   Alle Difabio Nedra Hai, [facility] Guide, Health Coach [address] [place] [address] Telephone: [phone] Email: [email or web address]

## 2020-12-20 ENCOUNTER — Encounter: Payer: Self-pay | Admitting: Family Medicine

## 2020-12-26 ENCOUNTER — Telehealth: Payer: Self-pay

## 2020-12-26 DIAGNOSIS — Z0000 Encounter for general adult medical examination without abnormal findings: Secondary | ICD-10-CM

## 2020-12-26 NOTE — Telephone Encounter (Signed)
Patient called in to reschedule health coaching appointment because he has another conflicting appointment on 11/3. Patient would like to be rescheduled for 11/9 at 1:30pm. Patient has been scheduled for that time in-person. Patient will call back if that conflicts with his social worker's schedule.   Melvin Ray, [facility] Guide, Health Coach [address] [place] [address] Telephone: [phone] Email: [email or web address]

## 2020-12-26 NOTE — Telephone Encounter (Signed)
Patient called to keep original appointment time of 11/3 at 1:30pm because of transportation issues on 11/9. Patient will hold session over the phone instead of in-person. Patient will be called at this time.    Francesca Strome Nedra Hai, [facility] Guide, Health Coach [address] [place] [address] Telephone: [phone] Email: [email or web address]

## 2020-12-27 ENCOUNTER — Ambulatory Visit: Payer: Medicaid Other

## 2020-12-27 ENCOUNTER — Ambulatory Visit (INDEPENDENT_AMBULATORY_CARE_PROVIDER_SITE_OTHER): Payer: Medicaid Other

## 2020-12-27 ENCOUNTER — Other Ambulatory Visit: Payer: Self-pay

## 2020-12-27 DIAGNOSIS — Z0000 Encounter for general adult medical examination without abnormal findings: Secondary | ICD-10-CM

## 2020-12-27 LAB — HEPATIC FUNCTION PANEL
ALT: 21 (ref 10–40)
AST: 12 — AB (ref 14–40)
Alkaline Phosphatase: 64 (ref 25–125)
Bilirubin, Total: 0.4

## 2020-12-27 LAB — TSH: TSH: 1.47 (ref 0.41–5.90)

## 2020-12-27 LAB — BASIC METABOLIC PANEL
BUN: 18 (ref 4–21)
CO2: 30 — AB (ref 13–22)
Chloride: 96 — AB (ref 99–108)
Creatinine: 1.6 — AB (ref 0.6–1.3)
Potassium: 3.8 (ref 3.4–5.3)
Sodium: 138 (ref 137–147)

## 2020-12-27 LAB — LIPID PANEL
Cholesterol: 153 (ref 0–200)
HDL: 38 (ref 35–70)
LDL Cholesterol: 86
Triglycerides: 192 — AB (ref 40–160)

## 2020-12-27 LAB — HEMOGLOBIN A1C: Hemoglobin A1C: 8.4

## 2020-12-27 LAB — COMPREHENSIVE METABOLIC PANEL
Albumin: 3.7 (ref 3.5–5.0)
Calcium: 9.8 (ref 8.7–10.7)
Globulin: 3.2

## 2020-12-27 NOTE — Progress Notes (Addendum)
Appointment Outcome: Completed, Session #: 9 Start time: 1:31pm   End time: 2:02pm   Total Mins: 31 minutes  AGREEMENTS SECTION   Overall Goal(s): Improve healthy eating behaviors Increase physical activity   Agreement/Action Steps:  Improve health eating behaviors Read all food labels Cook at home 1-2x/week Don't cook with salt Follow dietary restrictions from Endocrinologist No juice, soda, sugar, bread, or cakes/cookies   Increase physical activity Walk 2-3 days/week (30-45 minutes = approx. 1 mile) Walk to the store (approx. 1-mile round trip) Walk 15-20 minutes Mon, Wed, Fri around neighborhood  Progress Notes:  Patient stated that he is reading food labels on everything that has a label. Patient shared that he bought him a subway sandwich and does not know what the contents are. Patient stated that he wasn't sure if he had the nutritional information for [employer] in the information that was provided for him on various restaurants that he eats at, but will check.   Patient stated that when he read food labels, he is monitoring his sodium and sugar intake. Patient gave the examples that the can sodas that the drink contains approximately 36-40 grams of sugar per can. Patient mentioned that he is drinking approximately 1/2 gallon of water/day. 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   Patient stated that he is practicing portion control and cannot eat the way that he used to because he gets full quick.  Patient shared that he is not eating a whole pizza by himself, and that he only fixes one sandwich when he used to fix two at a time. Patient is cooking at home as agreed, but eat canned foods (spaghetti), cheap frozen meals ([employer]), pizza, and hot pockets. Patient gave an example of a meal that he prepared yesterday (steak and potatoes). Patient continues to cook with no salt. Patient stated that he only eats out when  he gets a ride somewhere. Patient has limited his takeout.   Patient stated that he has slowed down on walking due to the weather over the past two weeks. Patient shared that he is motivated to walk because he must get his A1C down. Patient mentioned that his A1C was a 6 and now it's 8. Patient walked today halfway the distance to the store and back home. Patient reported that he is being more physically active with cleaning yards and walking his dog at least 3 times/day around the neighborhood.   Indicators of Success and Accountability:  Patient stated that practicing portion control is his indicator of success and accountability.  Readiness: Patient is in the action stage of improving healthy eating behaviors and increasing physical activity.  Strengths and Supports: Patient is being supported by friends and family. Patient is becoming more mindful of his eating behaviors.  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  Coaching Outcomes: Patient is still interested in going to the gym but stressed the need for transportation.  Patient is going to challenge himself to start drinking one gallon of water/day like he was before.   Will reach out to [employer] to determine next steps for patient to get a ride to the [facility] in [place], [place] and will follow up with the patient. Patient stated that paying for a gym membership is not an issue.   Patient was educated on alternatives that he could implement to help him start reducing his sugar intake  over time, in addition to his bread consumption.   Patient was provided information on food addiction and suggestions that he could implement to aid in breaking his sugar addiction.   Patient will implement the following action steps over the next two weeks.  Agreement/Action Steps:  Improve health eating behaviors Read all food labels Cook at home 1-2x/week Don't cook with salt Increase water  consumption to 1 gallon/day Practice portion control Follow dietary restrictions from Endocrinologist No juice, soda, sugar, bread, or cakes/cookies   Increase physical activity Walk 2-3 days/week (30-45 minutes = approx. 1 mile) Walk to the store (approx. 1-mile round trip) Walk 15-20 minutes Mon, Wed, Fri around neighborhood  Attempted: Fulfilled - Patient is reading food labels on everything that has a label. Patient is cooking at home 1-2xs/week and continues not to cook with salt.  Partial - Patient decreased his walking to the store and around the neighborhood due to weather in the past two weeks.  Not met - Patient is not adhering to dietary restrictions from Endocrinologist.

## 2021-01-02 ENCOUNTER — Ambulatory Visit: Payer: Medicaid Other

## 2021-01-10 ENCOUNTER — Ambulatory Visit: Payer: Medicaid Other

## 2021-01-10 ENCOUNTER — Telehealth: Payer: Self-pay

## 2021-01-10 DIAGNOSIS — Z0000 Encounter for general adult medical examination without abnormal findings: Secondary | ICD-10-CM

## 2021-01-10 NOTE — Telephone Encounter (Signed)
Called patient to determine if he was in route to his scheduled health coaching session. Patient was sleep and requested to be rescheduled. Patient has been rescheduled for 11/18 at 1:30pm. Patient will be called at this time.   Jim Philemon Nedra Hai, [facility] Guide, Health Coach [address] [place] [address] Telephone: [phone] Email: [email or web address]

## 2021-01-11 ENCOUNTER — Telehealth: Payer: Self-pay

## 2021-01-11 ENCOUNTER — Ambulatory Visit: Payer: Medicaid Other

## 2021-01-11 DIAGNOSIS — Z0000 Encounter for general adult medical examination without abnormal findings: Secondary | ICD-10-CM

## 2021-01-11 NOTE — Telephone Encounter (Signed)
Called patient to hold health coaching session. Patient did not answer. Was unable to leave message because voicemail is not set up. Will call patient on 01/14/21 to follow up.   Biviana Saddler Nedra Hai, [facility] Guide, Health Coach [address] [place] [address] Telephone: [phone] Email: [email or web address]

## 2021-01-15 ENCOUNTER — Telehealth: Payer: Self-pay

## 2021-01-15 NOTE — Telephone Encounter (Signed)
Letter has been sent to patient instructing them to call us if they are still interested in completing their sleep study. If we have not received a response from the patient within 30 days of this notice, the order will be cancelled and they will need to discuss the need for a sleep study at their next office visit.  ° °

## 2021-01-16 ENCOUNTER — Encounter: Payer: Self-pay | Admitting: Podiatry

## 2021-01-16 ENCOUNTER — Other Ambulatory Visit: Payer: Self-pay

## 2021-01-16 ENCOUNTER — Ambulatory Visit (INDEPENDENT_AMBULATORY_CARE_PROVIDER_SITE_OTHER): Payer: Medicaid Other | Admitting: Podiatry

## 2021-01-16 DIAGNOSIS — M79675 Pain in left toe(s): Secondary | ICD-10-CM | POA: Diagnosis not present

## 2021-01-16 DIAGNOSIS — B351 Tinea unguium: Secondary | ICD-10-CM | POA: Diagnosis not present

## 2021-01-16 DIAGNOSIS — M79674 Pain in right toe(s): Secondary | ICD-10-CM | POA: Diagnosis not present

## 2021-01-16 DIAGNOSIS — E1159 Type 2 diabetes mellitus with other circulatory complications: Secondary | ICD-10-CM

## 2021-01-16 NOTE — Progress Notes (Signed)
This patient returns to my office for at risk foot care.  This patient requires this care by a professional since this patient will be at risk due to having diabetes.   This patient is unable to cut nails himself since the patient cannot reach his nails.These nails are painful walking and wearing shoes.  This patient presents for at risk foot care today.  He is brought to the office by caregiver from  [place].  General Appearance  Alert, conversant and in no acute stress.  Vascular  Dorsalis pedis and posterior tibial  pulses are not  palpable  Bilaterally due to swelling..  Capillary return is within normal limits  bilaterally. Temperature is within normal limits  bilaterally.  Neurologic  Senn-Weinstein monofilament wire test within normal limits  bilaterally. Muscle power within normal limits bilaterally.  Nails Thick disfigured discolored nails with subungual debris  from hallux to fifth toes bilaterally. No evidence of bacterial infection or drainage bilaterally.  Orthopedic  No limitations of motion  feet .  No crepitus or effusions noted.  No bony pathology or digital deformities noted.  Skin  normotropic skin with no porokeratosis noted bilaterally.  No signs of infections or ulcers noted.   Callus and fizzures developing sub 5th met right foot.    Onychomycosis  Pain in right toes  Pain in left toes  Consent was obtained for treatment procedures.   Mechanical debridement of nails 1-5  bilaterally performed with a nail nipper.  Filed with dremel without incident.    Return office visit   3 months                 Told patient to return for periodic foot care and evaluation due to potential at risk complications.   Gardiner Barefoot [name]

## 2021-01-21 ENCOUNTER — Ambulatory Visit: Payer: Medicaid Other | Admitting: Family Medicine

## 2021-01-22 ENCOUNTER — Telehealth: Payer: Self-pay

## 2021-01-22 DIAGNOSIS — Z0000 Encounter for general adult medical examination without abnormal findings: Secondary | ICD-10-CM

## 2021-01-22 NOTE — Telephone Encounter (Signed)
Called patient to reschedule health coaching session. Patient did not answer. Was unable to leave a message because voicemail is not set up.   Tiea Manninen Nedra Hai, [facility] Guide, Health Coach [address] [place] [address] Telephone: [phone] Email: [email or web address]

## 2021-02-11 ENCOUNTER — Ambulatory Visit (INDEPENDENT_AMBULATORY_CARE_PROVIDER_SITE_OTHER): Payer: Medicaid Other | Admitting: "Endocrinology

## 2021-02-11 ENCOUNTER — Other Ambulatory Visit: Payer: Self-pay

## 2021-02-11 VITALS — BP 126/84 | HR 64 | Ht 72.0 in | Wt >= 6400 oz

## 2021-02-11 DIAGNOSIS — Z794 Long term (current) use of insulin: Secondary | ICD-10-CM

## 2021-02-11 DIAGNOSIS — E1122 Type 2 diabetes mellitus with diabetic chronic kidney disease: Secondary | ICD-10-CM

## 2021-02-11 DIAGNOSIS — I10 Essential (primary) hypertension: Secondary | ICD-10-CM | POA: Diagnosis not present

## 2021-02-11 DIAGNOSIS — N182 Chronic kidney disease, stage 2 (mild): Secondary | ICD-10-CM

## 2021-02-11 DIAGNOSIS — E782 Mixed hyperlipidemia: Secondary | ICD-10-CM | POA: Diagnosis not present

## 2021-02-11 NOTE — Progress Notes (Signed)
02/11/2021, 5:55 PM  Endocrinology follow-up note   Subjective:    Patient ID: Melvin Ray, male    DOB: [birth year].  Melvin Ray is being seen in follow up after he was sen in consultation for management of currently uncontrolled symptomatic diabetes requested by  Leonie Douglas, MD. He is assisted and accompanied by his case manager Johnny.   Past Medical History:  Diagnosis Date   Diabetes mellitus without complication ([place])    Hypertension    Morbid obesity ([place])    Obstructive sleep apnea    Onychomycosis    Tobacco abuse     Past Surgical History:  Procedure Laterality Date   APPENDECTOMY      Social History   Socioeconomic History   Marital status: Single    Spouse name: Not on file   Number of children: Not on file   Years of education: Not on file   Highest education level: Not on file  Occupational History   Not on file  Tobacco Use   Smoking status: Former    Types: Cigars   Smokeless tobacco: Former  Scientific laboratory technician Use: Every day  Substance and Sexual Activity   Alcohol use: Never   Drug use: Never   Sexual activity: Not on file  Other Topics Concern   Not on file  Social History Narrative   Not on file   Social Determinants of Health   Financial Resource Strain: Low Risk    Difficulty of Paying Living Expenses: Not very hard  Food Insecurity: No Food Insecurity   Worried About Charity fundraiser in the Last Year: Never true   Arboriculturist in the Last Year: Never true  Transportation Needs: Public librarian (Medical): Yes   Lack of Transportation (Non-Medical): Yes  Physical Activity: Insufficiently Active   Days of Exercise per Week: 3 days   Minutes of Exercise per Session: 40 min  Stress: Not on file  Social Connections: Not on file    Family History  Problem Relation Age of Onset   Heart failure  Father    Hypertension Father    Diabetes Maternal Grandmother    Heart failure Paternal Grandmother        transplant    Outpatient Encounter Medications as of 02/11/2021  Medication Sig   ACCU-CHEK GUIDE test strip 3 (three) times daily.   Accu-Chek Softclix Lancets lancets 3 (three) times daily.   albuterol (VENTOLIN HFA) 108 (90 Base) MCG/ACT inhaler Inhale into the lungs every 6 (six) hours as needed for wheezing or shortness of breath.   chlorthalidone (HYGROTON) 25 MG tablet Take 1 tablet (25 mg total) by mouth daily.   Cholecalciferol 50 MCG (2000 UT) CAPS Take 1 capsule (2,000 Units total) by mouth daily with breakfast.   cloNIDine (CATAPRES) 0.1 MG tablet Take 0.1 mg by mouth 2 (two) times daily.   diltiazem (CARDIZEM CD) 240 MG 24 hr capsule Take 240 mg by mouth daily.   FLUoxetine (PROZAC) 20 MG tablet Take 20 mg by mouth daily.   Fluticasone-Salmeterol (  ADVAIR) 250-50 MCG/DOSE AEPB Inhale 1 puff into the lungs daily.   furosemide (LASIX) 20 MG tablet TAKE 1 TABLET ONCE DAILY.   glipiZIDE (GLUCOTROL XL) 10 MG 24 hr tablet TAKE 1 TABLET ONCE DAILY WITH BREAKFAST.   hydrALAZINE (APRESOLINE) 50 MG tablet Take 1 tablet (50 mg total) by mouth 3 (three) times daily.   ibuprofen (ADVIL) 400 MG tablet Take 400 mg by mouth 3 (three) times daily.   insulin glargine (LANTUS SOLOSTAR) 100 UNIT/ML Solostar Pen Inject 50 Units into the skin at bedtime.   metFORMIN (GLUCOPHAGE-XR) 500 MG 24 hr tablet TAKE 2 TABLETS ONCE DAILY WITH BREAKFAST.   metoprolol (TOPROL-XL) 200 MG 24 hr tablet Take 200 mg by mouth daily.   pantoprazole (PROTONIX) 40 MG tablet Take 40 mg by mouth daily.   potassium chloride (KLOR-CON) 10 MEQ tablet Take 10 mEq by mouth daily.   traZODone (DESYREL) 100 MG tablet Take 200 mg by mouth at bedtime.    valsartan (DIOVAN) 320 MG tablet Take 320 mg by mouth daily.   No facility-administered encounter medications on file as of 02/11/2021.    ALLERGIES: No Known  Allergies  VACCINATION STATUS:  There is no immunization history on file for this patient.  Diabetes He presents for his follow-up diabetic visit. He has type 2 diabetes mellitus. Onset time: He was diagnosed through approximate age of 61 years. His disease course has been improving. There are no hypoglycemic associated symptoms. Pertinent negatives for hypoglycemia include no headaches, seizures or tremors. Pertinent negatives for diabetes include no blurred vision, no chest pain, no polydipsia, no polyuria and no weight loss. There are no hypoglycemic complications. Symptoms are improving. Diabetic complications include nephropathy. Risk factors for coronary artery disease include dyslipidemia, diabetes mellitus, family history, male sex, obesity, hypertension, tobacco exposure and sedentary lifestyle. His weight is increasing steadily. He is following a generally unhealthy diet. When asked about meal planning, he reported none. He has not had a previous visit with a dietitian. He never participates in exercise. His home blood glucose trend is decreasing steadily. His overall blood glucose range is 140-180 mg/dl. (He presents with his meter showing average blood glucose of  149 over the last 30 days.  His previsit a1c is higher at 8.4% from 6.6%  after significantly improving from 13.5% in his prior measurement.  He has no hypoglycemia documented or reported.     ) An ACE inhibitor/angiotensin II receptor blocker is being taken. He does not see a podiatrist.Eye exam is not current.  Hyperlipidemia This is a chronic problem. The current episode started more than 1 year ago. Exacerbating diseases include chronic renal disease, diabetes and obesity. Pertinent negatives include no chest pain, myalgias or shortness of breath. Risk factors for coronary artery disease include dyslipidemia, diabetes mellitus, family history, obesity, male sex, hypertension and a sedentary lifestyle.  Hypertension This is a  chronic problem. The current episode started more than 1 year ago. Pertinent negatives include no blurred vision, chest pain, headaches, palpitations or shortness of breath. Risk factors for coronary artery disease include dyslipidemia, diabetes mellitus, family history, obesity, male gender, sedentary lifestyle and smoking/tobacco exposure. Past treatments include central alpha agonists and angiotensin blockers. Identifiable causes of hypertension include chronic renal disease.    Review of Systems  Constitutional:  Negative for chills, fever and weight loss.  Eyes:  Negative for blurred vision.  Respiratory:  Negative for cough and shortness of breath.   Cardiovascular:  Negative for chest pain and palpitations.  No Shortness of breath  Gastrointestinal:  Negative for abdominal pain, diarrhea, nausea and vomiting.  Endocrine: Negative for polydipsia and polyuria.  Genitourinary:  Negative for frequency, hematuria and urgency.  Musculoskeletal:  Negative for myalgias.  Skin:  Negative for rash.  Neurological:  Negative for tremors, seizures and headaches.  Hematological:  Does not bruise/bleed easily.  Psychiatric/Behavioral:  Negative for hallucinations and suicidal ideas.    Objective:    Vitals with BMI 02/11/2021 11/20/2020 11/07/2020  Height 6\' 0"  6\' 0"  6\' 0"   Weight 437 lbs 13 oz 451 lbs 3 oz 450 lbs  BMI 59.36 0000000 A999333  Systolic 123XX123 0000000 A999333  Diastolic 84 [id number] 82  Pulse 64 67 68    BP 126/84    Pulse 64    Ht 6' (1.829 m)    Wt (!) 437 lb 12.8 oz (198.6 kg)    BMI 59.38 kg/m   Wt Readings from Last 3 Encounters:  02/11/21 (!) 437 lb 12.8 oz (198.6 kg)  11/20/20 (!) 451 lb 3.2 oz (204.7 kg)  11/07/20 (!) 450 lb (204.1 kg)     Physical Exam Constitutional:      General: He is not in acute distress.    Appearance: He is well-developed.  HENT:     Head: Normocephalic and atraumatic.     Mouth/Throat:     Mouth: Mucous membranes are dry.  Neck:     Thyroid: No  thyromegaly.     Trachea: No tracheal deviation.  Cardiovascular:     Rate and Rhythm: Normal rate.     Pulses:          Dorsalis pedis pulses are 1+ on the right side and 1+ on the left side.       Posterior tibial pulses are 1+ on the right side and 1+ on the left side.     Heart sounds: S1 normal and S2 normal. No murmur heard.   No gallop.  Pulmonary:     Effort: Pulmonary effort is normal. No respiratory distress.     Breath sounds: No wheezing.  Abdominal:     General: Bowel sounds are normal. There is no distension.     Palpations: Abdomen is soft.     Tenderness: There is no abdominal tenderness. There is no guarding.  Musculoskeletal:     Right shoulder: No swelling or deformity.     Cervical back: Normal range of motion and neck supple.  Skin:    General: Skin is warm and dry.     Findings: No rash.     Nails: There is no clubbing.  Neurological:     Mental Status: He is alert and oriented to person, place, and time.     Cranial Nerves: No cranial nerve deficit.     Sensory: No sensory deficit.     Gait: Gait normal.     Deep Tendon Reflexes: Reflexes are normal and symmetric.  Psychiatric:        Speech: Speech normal.        Behavior: Behavior normal. Behavior is cooperative.        Thought Content: Thought content normal.        Judgment: Judgment normal.    Diabetic Labs (most recent): Lab Results  Component Value Date   HGBA1C 8.4 12/27/2020   HGBA1C 6.6 09/06/2020   HGBA1C 13.5 06/21/2020      Recent Results (from the past 2160 hour(s))  Basic metabolic panel     Status: Abnormal  Collection Time: 12/27/20 12:00 AM  Result Value Ref Range   BUN 18 4 - 21   CO2 30 (A) 13 - 22   Creatinine 1.6 (A) 0.6 - 1.3   Potassium 3.8 3.4 - 5.3   Sodium 138 137 - 147   Chloride 96 (A) 99 - 108  Comprehensive metabolic panel     Status: None   Collection Time: 12/27/20 12:00 AM  Result Value Ref Range   Globulin 3.2    Calcium 9.8 8.7 - 10.7   Albumin 3.7  3.5 - 5.0  Lipid panel     Status: Abnormal   Collection Time: 12/27/20 12:00 AM  Result Value Ref Range   Triglycerides 192 (A) 40 - 160   Cholesterol 153 0 - 200   HDL 38 35 - 70   LDL Cholesterol 86   Hepatic function panel     Status: Abnormal   Collection Time: 12/27/20 12:00 AM  Result Value Ref Range   Alkaline Phosphatase 64 25 - 125   ALT 21 10 - 40   AST 12 (A) 14 - 40   Bilirubin, Total 0.4   Hemoglobin A1c     Status: None   Collection Time: 12/27/20 12:00 AM  Result Value Ref Range   Hemoglobin A1C 8.4   TSH     Status: None   Collection Time: 12/27/20 12:00 AM  Result Value Ref Range   TSH 1.47 0.41 - 5.90     Assessment & Plan:   1. Type 2 diabetes mellitus with stage 2 chronic kidney disease, with long-term current use of insulin ([facility])   - Melvin Ray has currently uncontrolled symptomatic type 2 DM since  35 years of age. Recent labs reviewed.   He presents with his meter showing average blood glucose of  149 over the last 30 days.  His previsit a1c is higher at 8.4% from 6.6%  after significantly improving from 13.5% in his prior measurement.  He has no hypoglycemia documented or reported.    - I had a long discussion with him about the progressive nature of diabetes and the pathology behind its complications. -his diabetes is complicated by CKD, obesity/sedentary life, smoking and he remains at a high risk for more acute and chronic complications which include CAD, CVA, CKD, retinopathy, and neuropathy. These are all discussed in detail with him.  - I have counseled him on diet  and weight management  by adopting a carbohydrate restricted/protein rich diet. Patient is encouraged to switch to  unprocessed or minimally processed     complex starch and increased protein intake (animal or plant source), fruits, and vegetables. -  he is advised to stick to a routine mealtimes to eat 3 meals  a day and avoid unnecessary snacks ( to snack only to correct  hypoglycemia).  -He has regained most of the weight he lost when he was dealing with severe hyperglycemia with glycosuria. - he acknowledges that there is a room for improvement in his food and drink choices. - Suggestion is made for him to avoid simple carbohydrates  from his diet including Cakes, Sweet Desserts, Ice Cream, Soda (diet and regular), Sweet Tea, Candies, Chips, Cookies, Store Bought Juices, Alcohol in Excess of  1-2 drinks a day, Artificial Sweeteners,  Coffee Creamer, and "Sugar-free" Products, Lemonade. This will help patient to have more stable blood glucose profile and potentially avoid unintended weight gain.   - he has been  scheduled with Jearld Fenton, RDN, CDE  for diabetes education.  - I have approached him with the following individualized plan to manage  his diabetes and patient agrees:   -In light of his presentation with near target fasting glycemic profile, he is advised to continue  Lantus  50 units nightly, associated with monitoring of blood glucose at least twice a day-daily before breakfast and at bedtime.     - he is encouraged to call clinic for blood glucose levels less than 70 or above 200 mg /dl.  - he is warned not to take insulin without proper monitoring per orders.  -  He is advised to increase metformin to 1000 mg XR p.o. daily after breakfast.   -He is also benefiting from glipizide.  He is advised to continue glipizide 10 mg XL p.o. daily at breakfast.   -He is counseled for  smoking cessation, he is too high risk to's give incretin therapy due to his risk of pancreatitis. Tragically, patient continues to smoke.  He has obstructive sleep apnea would benefit the most from weight loss and smoking cessation. The patient was counseled on the dangers of tobacco use, and was advised to quit.  Reviewed strategies to maximize success, including removing cigarettes and smoking materials from environment.   - Specific targets for  A1c;  LDL, HDL,  and  Triglycerides were discussed with the patient.  2) Blood Pressure /Hypertension: His blood pressure is not controlled to target.  he is advised to continue his current medications including  chlorthalidone 25 mg p.o. daily, clonidine 0.1 mg p.o. twice daily, hydralazine 50 mg p.o. 3 times daily, Losartan 100 mg p.o. every morning, metoprolol 200 mg p.o. daily.   Admittedly, he consumes salty food, advised to limit salt consumption.   3) Lipids/Hyperlipidemia: Recent lipid panel showed LDL at 103, triglycerides at 135.  He will be considered for statin intervention during his next visit.  4)  Weight/Diet:  Body mass index is 59.38 kg/m.  -   clearly complicating his diabetes care.   he is  a candidate for modest weight loss. I discussed with him the fact that loss of 5 - 10% of his  current body weight will have the most impact on his diabetes management.  Exercise, and detailed carbohydrates information provided  -  detailed on discharge instructions.  5) Chronic Care/Health Maintenance:  -he  is on ARB medications and  is encouraged to initiate and continue to follow up with Ophthalmology, Dentist,  Podiatrist at least yearly or according to recommendations, and advised to  quit smoking. I have recommended yearly flu vaccine and pneumonia vaccine at least every 5 years; moderate intensity exercise for up to 150 minutes weekly; and  sleep for at least 7 hours a day.  - he is  advised to maintain close follow up with Waldon Reining, MD for primary care needs, as well as his other providers for optimal and coordinated care.      I spent  41 minutes in the care of the patient today including review of labs from CMP, Lipids, Thyroid Function, Hematology (current and previous including abstractions from other facilities); face-to-face time discussing  his blood glucose readings/logs, discussing hypoglycemia and hyperglycemia episodes and symptoms, medications doses, his options of short and long  term treatment based on the latest standards of care / guidelines;  discussion about incorporating lifestyle medicine;  and documenting the encounter.    Please refer to Patient Instructions for Blood Glucose Monitoring and Insulin/Medications Dosing Guide"  in media tab  for additional information. Please  also refer to " Patient Self Inventory" in the Media  tab for reviewed elements of pertinent patient history.  Melvin Ray participated in the discussions, expressed understanding, and voiced agreement with the above plans.  All questions were answered to his satisfaction. he is encouraged to contact clinic should he have any questions or concerns prior to his return visit.     Follow up plan: - Return in about 3 months (around 05/12/2021) for Bring Meter and Logs- A1c in Office.  Glade Lloyd, MD [facility] [address] [place], [place] [address] Phone: [phone]  Fax: [phone]    02/11/2021, 5:55 PM  This note was partially dictated with voice recognition software. Similar sounding words can be transcribed inadequately or may not  be corrected upon review.

## 2021-02-11 NOTE — Patient Instructions (Signed)

## 2021-02-12 ENCOUNTER — Other Ambulatory Visit (HOSPITAL_COMMUNITY): Payer: Self-pay | Admitting: Nephrology

## 2021-02-12 ENCOUNTER — Other Ambulatory Visit: Payer: Self-pay | Admitting: Nephrology

## 2021-02-12 ENCOUNTER — Other Ambulatory Visit: Payer: Self-pay | Admitting: "Endocrinology

## 2021-02-12 DIAGNOSIS — I129 Hypertensive chronic kidney disease with stage 1 through stage 4 chronic kidney disease, or unspecified chronic kidney disease: Secondary | ICD-10-CM

## 2021-02-12 DIAGNOSIS — E1122 Type 2 diabetes mellitus with diabetic chronic kidney disease: Secondary | ICD-10-CM

## 2021-03-01 ENCOUNTER — Other Ambulatory Visit: Payer: Self-pay

## 2021-03-01 ENCOUNTER — Ambulatory Visit (HOSPITAL_COMMUNITY)
Admission: RE | Admit: 2021-03-01 | Discharge: 2021-03-01 | Disposition: A | Discharge status: Home / Self Care | Payer: Medicaid Other | Source: Ambulatory Visit | Attending: Nephrology | Admitting: Nephrology

## 2021-03-01 DIAGNOSIS — I129 Hypertensive chronic kidney disease with stage 1 through stage 4 chronic kidney disease, or unspecified chronic kidney disease: Secondary | ICD-10-CM | POA: Diagnosis present

## 2021-03-01 DIAGNOSIS — E1122 Type 2 diabetes mellitus with diabetic chronic kidney disease: Secondary | ICD-10-CM | POA: Insufficient documentation

## 2021-03-18 ENCOUNTER — Encounter (INDEPENDENT_AMBULATORY_CARE_PROVIDER_SITE_OTHER): Payer: Self-pay | Admitting: *Deleted

## 2021-03-29 ENCOUNTER — Other Ambulatory Visit: Payer: Self-pay | Admitting: Cardiology

## 2021-04-09 ENCOUNTER — Encounter: Payer: Self-pay | Admitting: Podiatry

## 2021-04-09 ENCOUNTER — Other Ambulatory Visit: Payer: Self-pay

## 2021-04-09 ENCOUNTER — Ambulatory Visit (INDEPENDENT_AMBULATORY_CARE_PROVIDER_SITE_OTHER): Payer: Medicaid Other | Admitting: Podiatry

## 2021-04-09 DIAGNOSIS — M79675 Pain in left toe(s): Secondary | ICD-10-CM

## 2021-04-09 DIAGNOSIS — E1159 Type 2 diabetes mellitus with other circulatory complications: Secondary | ICD-10-CM

## 2021-04-09 DIAGNOSIS — M79674 Pain in right toe(s): Secondary | ICD-10-CM | POA: Diagnosis not present

## 2021-04-09 DIAGNOSIS — Q828 Other specified congenital malformations of skin: Secondary | ICD-10-CM | POA: Diagnosis not present

## 2021-04-09 DIAGNOSIS — B351 Tinea unguium: Secondary | ICD-10-CM

## 2021-04-09 NOTE — Progress Notes (Signed)
This patient returns to my office for at risk foot care.  This patient requires this care by a professional since this patient will be at risk due to having diabetes.   This patient is unable to cut nails himself since the patient cannot reach his nails.These nails are painful walking and wearing shoes.  This patient presents for at risk foot care today. His caregiver states he has developed a painful callus on the inside of left foot. He is brought to the office by caregiver from  [place].  General Appearance  Alert, conversant and in no acute stress.  Vascular  Dorsalis pedis and posterior tibial  pulses are not  palpable  Bilaterally due to swelling..  Capillary return is within normal limits  bilaterally. Temperature is within normal limits  bilaterally.  Neurologic  Senn-Weinstein monofilament wire test within normal limits  bilaterally. Muscle power within normal limits bilaterally.  Nails Thick disfigured discolored nails with subungual debris  from hallux to fifth toes bilaterally. No evidence of bacterial infection or drainage bilaterally.  Orthopedic  No limitations of motion  feet .  No crepitus or effusions noted.  No bony pathology or digital deformities noted.  Skin  normotropic skin with no porokeratosis noted bilaterally.  No signs of infections or ulcers noted.   Callus and fizzures developing sub 5th met right foot.  Porokeratosis medial aspect left foot.  Onychomycosis  Pain in right toes  Pain in left toes  Consent was obtained for treatment procedures.   Mechanical debridement of nails 1-5  bilaterally performed with a nail nipper.  Filed with dremel without incident. Debride porokeratosis with # 15 blade left foot.   Return office visit   3 months                 Told patient to return for periodic foot care and evaluation due to potential at risk complications.   Gardiner Barefoot [name]

## 2021-04-22 ENCOUNTER — Other Ambulatory Visit: Payer: Self-pay | Admitting: "Endocrinology

## 2021-05-09 LAB — HEPATIC FUNCTION PANEL
ALT: 19 U/L (ref 10–40)
AST: 3 — AB (ref 14–40)
Alkaline Phosphatase: 100 (ref 25–125)
Bilirubin, Total: 0.4

## 2021-05-09 LAB — BASIC METABOLIC PANEL
BUN: 20 (ref 4–21)
CO2: 30 — AB (ref 13–22)
Chloride: 100 (ref 99–108)
Creatinine: 1.5 — AB (ref 0.6–1.3)
Glucose: 296
Potassium: 3.7 mEq/L (ref 3.5–5.1)
Sodium: 141 (ref 137–147)

## 2021-05-09 LAB — COMPREHENSIVE METABOLIC PANEL
Albumin: 4 (ref 3.5–5.0)
Calcium: 9.3 (ref 8.7–10.7)
Globulin: 3.3

## 2021-05-09 LAB — TSH: TSH: 1.73 (ref 0.41–5.90)

## 2021-05-14 ENCOUNTER — Other Ambulatory Visit: Payer: Self-pay

## 2021-05-14 ENCOUNTER — Ambulatory Visit (INDEPENDENT_AMBULATORY_CARE_PROVIDER_SITE_OTHER): Payer: Medicaid Other | Admitting: "Endocrinology

## 2021-05-14 ENCOUNTER — Encounter: Payer: Self-pay | Admitting: "Endocrinology

## 2021-05-14 VITALS — BP 140/96 | HR 64 | Ht 72.0 in | Wt >= 6400 oz

## 2021-05-14 DIAGNOSIS — I10 Essential (primary) hypertension: Secondary | ICD-10-CM | POA: Diagnosis not present

## 2021-05-14 DIAGNOSIS — E1122 Type 2 diabetes mellitus with diabetic chronic kidney disease: Secondary | ICD-10-CM

## 2021-05-14 DIAGNOSIS — Z794 Long term (current) use of insulin: Secondary | ICD-10-CM | POA: Diagnosis not present

## 2021-05-14 DIAGNOSIS — N182 Chronic kidney disease, stage 2 (mild): Secondary | ICD-10-CM | POA: Diagnosis not present

## 2021-05-14 DIAGNOSIS — E782 Mixed hyperlipidemia: Secondary | ICD-10-CM

## 2021-05-14 LAB — POCT GLYCOSYLATED HEMOGLOBIN (HGB A1C): HbA1c, POC (controlled diabetic range): 8.6 % — AB (ref 0.0–7.0)

## 2021-05-14 MED ORDER — LANTUS SOLOSTAR 100 UNIT/ML ~~LOC~~ SOPN: 80.0000 [IU] | PEN_INJECTOR | Freq: Every day | SUBCUTANEOUS | 2 refills | Status: DC

## 2021-05-14 NOTE — Patient Instructions (Signed)

## 2021-05-14 NOTE — Progress Notes (Signed)
? ?                                                              ?     05/14/2021, 4:59 PM ? ?Endocrinology follow-up note ? ? ?Subjective:  ? ? Patient ID: Melvin Ray, male    DOB: Jun 08, 1985.  ?Melvin Ray is being seen in follow up after he was sen in consultation for management of currently uncontrolled symptomatic diabetes requested by  Waldon Reining, MD. ?He is assisted and accompanied by his case Production assistant, radio. ? ? ?Past Medical History:  ?Diagnosis Date  ? Diabetes mellitus without complication (HCC)   ? Hypertension   ? Morbid obesity (HCC)   ? Obstructive sleep apnea   ? Onychomycosis   ? Tobacco abuse   ? ? ?Past Surgical History:  ?Procedure Laterality Date  ? APPENDECTOMY    ? ? ?Social History  ? ?Socioeconomic History  ? Marital status: Single  ?  Spouse name: Not on file  ? Number of children: Not on file  ? Years of education: Not on file  ? Highest education level: Not on file  ?Occupational History  ? Not on file  ?Tobacco Use  ? Smoking status: Former  ?  Types: Cigars  ? Smokeless tobacco: Former  ?Vaping Use  ? Vaping Use: Every day  ?Substance and Sexual Activity  ? Alcohol use: Never  ? Drug use: Never  ? Sexual activity: Not on file  ?Other Topics Concern  ? Not on file  ?Social History Narrative  ? Not on file  ? ?Social Determinants of Health  ? ?Financial Resource Strain: Not on file  ?Food Insecurity: Not on file  ?Transportation Needs: Not on file  ?Physical Activity: Insufficiently Active  ? Days of Exercise per Week: 3 days  ? Minutes of Exercise per Session: 40 min  ?Stress: Not on file  ?Social Connections: Not on file  ? ? ?Family History  ?Problem Relation Age of Onset  ? Heart failure Father   ? Hypertension Father   ? Diabetes Maternal Grandmother   ? Heart failure Paternal Grandmother   ?     transplant  ? ? ?Outpatient Encounter Medications as of 05/14/2021  ?Medication Sig  ? ACCU-CHEK GUIDE test strip 3 (three) times daily.  ? Accu-Chek Softclix Lancets  lancets 3 (three) times daily.  ? albuterol (VENTOLIN HFA) 108 (90 Base) MCG/ACT inhaler Inhale into the lungs every 6 (six) hours as needed for wheezing or shortness of breath.  ? chlorthalidone (HYGROTON) 25 MG tablet Take 1 tablet (25 mg total) by mouth daily.  ? Cholecalciferol 50 MCG (2000 UT) CAPS Take 1 capsule (2,000 Units total) by mouth daily with breakfast.  ? cloNIDine (CATAPRES) 0.1 MG tablet Take 0.1 mg by mouth 2 (two) times daily.  ? diltiazem (CARDIZEM CD) 240 MG 24 hr capsule Take 240 mg by mouth daily.  ? FLUoxetine (PROZAC) 20 MG tablet Take 20 mg by mouth daily.  ? Fluticasone-Salmeterol (ADVAIR) 250-50 MCG/DOSE AEPB Inhale 1 puff into the lungs daily.  ? furosemide (LASIX) 20 MG tablet TAKE 1 TABLET ONCE DAILY.  ? glipiZIDE (GLUCOTROL XL) 10 MG 24 hr tablet TAKE 1 TABLET ONCE DAILY WITH BREAKFAST.  ? hydrALAZINE (APRESOLINE) 50 MG tablet Take 1 tablet (  50 mg total) by mouth 3 (three) times daily.  ? ibuprofen (ADVIL) 400 MG tablet Take 400 mg by mouth 3 (three) times daily.  ? insulin glargine (LANTUS SOLOSTAR) 100 UNIT/ML Solostar Pen Inject 80 Units into the skin at bedtime.  ? metFORMIN (GLUCOPHAGE-XR) 500 MG 24 hr tablet TAKE 2 TABLETS ONCE DAILY WITH BREAKFAST.  ? metoprolol (TOPROL-XL) 200 MG 24 hr tablet Take 200 mg by mouth daily.  ? pantoprazole (PROTONIX) 40 MG tablet Take 40 mg by mouth daily.  ? potassium chloride (KLOR-CON) 10 MEQ tablet Take 10 mEq by mouth daily.  ? traZODone (DESYREL) 100 MG tablet Take 200 mg by mouth at bedtime.   ? valsartan (DIOVAN) 320 MG tablet Take 320 mg by mouth daily.  ? [DISCONTINUED] insulin glargine (LANTUS SOLOSTAR) 100 UNIT/ML Solostar Pen Inject 50 Units into the skin at bedtime. (Patient taking differently: Inject 70 Units into the skin at bedtime.)  ? ?No facility-administered encounter medications on file as of 05/14/2021.  ? ? ?ALLERGIES: ?No Known Allergies ? ?VACCINATION STATUS: ? ?There is no immunization history on file for this  patient. ? ?Diabetes ?He presents for his follow-up diabetic visit. He has type 2 diabetes mellitus. Onset time: He was diagnosed through approximate age of 31 years. His disease course has been worsening. There are no hypoglycemic associated symptoms. Pertinent negatives for hypoglycemia include no headaches, seizures or tremors. Pertinent negatives for diabetes include no blurred vision, no chest pain, no polydipsia, no polyuria and no weight loss. There are no hypoglycemic complications. Symptoms are worsening. Diabetic complications include nephropathy. Risk factors for coronary artery disease include dyslipidemia, diabetes mellitus, family history, male sex, obesity, hypertension, tobacco exposure and sedentary lifestyle. Current diabetic treatment includes insulin injections. His weight is increasing steadily. He is following a generally unhealthy diet. When asked about meal planning, he reported none. He has not had a previous visit with a dietitian. He never participates in exercise. His home blood glucose trend is increasing steadily. His overall blood glucose range is 140-180 mg/dl. Dupriest presents with loss of control of glycemic profile.  His average blood glucose is 225-257 for the last 30 days.  His point-of-care A1c is 8.6%, increasing from 6.6%.he did have A1c as high as 13.5% a year ago.  He did not document any hypoglycemia.   ? ? ?) An ACE inhibitor/angiotensin II receptor blocker is being taken. He does not see a podiatrist.Eye exam is not current.  ?Hyperlipidemia ?This is a chronic problem. The current episode started more than 1 year ago. Exacerbating diseases include chronic renal disease, diabetes and obesity. Pertinent negatives include no chest pain, myalgias or shortness of breath. Risk factors for coronary artery disease include dyslipidemia, diabetes mellitus, family history, obesity, male sex, hypertension and a sedentary lifestyle.  ?Hypertension ?This is a chronic problem. The  current episode started more than 1 year ago. Pertinent negatives include no blurred vision, chest pain, headaches, palpitations or shortness of breath. Risk factors for coronary artery disease include dyslipidemia, diabetes mellitus, family history, obesity, male gender, sedentary lifestyle and smoking/tobacco exposure. Past treatments include central alpha agonists and angiotensin blockers. Identifiable causes of hypertension include chronic renal disease.  ? ? ?Review of Systems  ?Constitutional:  Negative for chills, fever and weight loss.  ?Eyes:  Negative for blurred vision.  ?Respiratory:  Negative for cough and shortness of breath.   ?Cardiovascular:  Negative for chest pain and palpitations.  ?     No Shortness of breath  ?Gastrointestinal:  Negative for abdominal pain, diarrhea, nausea and vomiting.  ?Endocrine: Negative for polydipsia and polyuria.  ?Genitourinary:  Negative for frequency, hematuria and urgency.  ?Musculoskeletal:  Negative for myalgias.  ?Skin:  Negative for rash.  ?Neurological:  Negative for tremors, seizures and headaches.  ?Hematological:  Does not bruise/bleed easily.  ?Psychiatric/Behavioral:  Negative for hallucinations and suicidal ideas.   ? ?Objective:  ?  ?Vitals with BMI 05/14/2021 02/11/2021 11/20/2020  ?Height 6\' 0"  6\' 0"  6\' 0"   ?Weight 449 lbs 13 oz 437 lbs 13 oz 451 lbs 3 oz  ?BMI 60.99 59.36 61.18  ?Systolic 140 126 161162  ?Diastolic 96 84 112  ?Pulse 64 64 67  ? ? ?BP (!) 140/96   Pulse 64   Ht 6' (1.829 m)   Wt (!) 449 lb 12.8 oz (204 kg)   BMI 61.00 kg/m?   ?Wt Readings from Last 3 Encounters:  ?05/14/21 (!) 449 lb 12.8 oz (204 kg)  ?02/11/21 (!) 437 lb 12.8 oz (198.6 kg)  ?11/20/20 (!) 451 lb 3.2 oz (204.7 kg)  ?  ? ? ? ?Diabetic Labs (most recent): ?Lab Results  ?Component Value Date  ? HGBA1C 8.6 (A) 05/14/2021  ? HGBA1C 8.4 12/27/2020  ? HGBA1C 6.6 09/06/2020  ? ? ?  ?Recent Results (from the past 2160 hour(s))  ?Basic metabolic panel     Status: Abnormal  ?  Collection Time: 05/09/21 12:00 AM  ?Result Value Ref Range  ? Glucose 296   ? BUN 20 4 - 21  ? CO2 30 (A) 13 - 22  ? Creatinine 1.5 (A) 0.6 - 1.3  ? Potassium 3.7 3.5 - 5.1 mEq/L  ? Sodium 141 137 - 147  ? Chloride 100 99 - 108

## 2021-05-16 ENCOUNTER — Encounter (INDEPENDENT_AMBULATORY_CARE_PROVIDER_SITE_OTHER): Payer: Self-pay | Admitting: Gastroenterology

## 2021-05-16 ENCOUNTER — Ambulatory Visit (INDEPENDENT_AMBULATORY_CARE_PROVIDER_SITE_OTHER): Payer: Medicaid Other | Admitting: Gastroenterology

## 2021-05-20 ENCOUNTER — Other Ambulatory Visit: Payer: Self-pay | Admitting: Cardiology

## 2021-05-21 LAB — HM DIABETES EYE EXAM

## 2021-05-22 ENCOUNTER — Other Ambulatory Visit: Payer: Self-pay | Admitting: Cardiovascular Disease

## 2021-06-05 ENCOUNTER — Encounter (INDEPENDENT_AMBULATORY_CARE_PROVIDER_SITE_OTHER): Payer: Self-pay | Admitting: *Deleted

## 2021-06-19 ENCOUNTER — Other Ambulatory Visit: Payer: Self-pay | Admitting: Cardiovascular Disease

## 2021-06-19 ENCOUNTER — Other Ambulatory Visit: Payer: Self-pay | Admitting: Cardiology

## 2021-06-19 NOTE — Telephone Encounter (Signed)
Rx(s) sent to pharmacy electronically.  

## 2021-07-09 ENCOUNTER — Ambulatory Visit (INDEPENDENT_AMBULATORY_CARE_PROVIDER_SITE_OTHER): Payer: Medicaid Other | Admitting: Podiatry

## 2021-07-09 ENCOUNTER — Encounter: Payer: Self-pay | Admitting: Podiatry

## 2021-07-09 DIAGNOSIS — E1159 Type 2 diabetes mellitus with other circulatory complications: Secondary | ICD-10-CM | POA: Diagnosis not present

## 2021-07-09 DIAGNOSIS — B351 Tinea unguium: Secondary | ICD-10-CM

## 2021-07-09 DIAGNOSIS — M79675 Pain in left toe(s): Secondary | ICD-10-CM | POA: Diagnosis not present

## 2021-07-09 DIAGNOSIS — M79674 Pain in right toe(s): Secondary | ICD-10-CM

## 2021-07-09 DIAGNOSIS — Q828 Other specified congenital malformations of skin: Secondary | ICD-10-CM

## 2021-07-09 NOTE — Progress Notes (Signed)
Patient presented today with Social Worker, Melvin Ray, to reestablish care with Dr. Duke Salvia, have his medications managed by PharmD, and restart health coaching. Sent message to Dr. Duke Salvia, Juliette Alcide, and Phillips Hay to determine if reestablishing care was an option so they can reach out to him for further follow-up. Patient will be called on 5/17 to discuss restarting health coaching more in detail and what his health goals are.  ? ?Melvin Ray, [facility] ?[facility] ?Care Guide, Health Coach ?[address] ?[place] [address] ?Telephone: [phone] ?Email: [name] ? ?

## 2021-07-09 NOTE — Progress Notes (Signed)
This patient returns to my office for at risk foot care.  This patient requires this care by a professional since this patient will be at risk due to having diabetes.   This patient is unable to cut nails himself since the patient cannot reach his nails.These nails are painful walking and wearing shoes.  This patient presents for at risk foot care today. His caregiver states he has developed a painful callus on the inside of left foot. He is brought to the office by caregiver from  [place]. ° °General Appearance  Alert, conversant and in no acute stress. ° °Vascular  Dorsalis pedis and posterior tibial  pulses are not  palpable  Bilaterally due to swelling..  Capillary return is within normal limits  bilaterally. Temperature is within normal limits  bilaterally. ° °Neurologic  Senn-Weinstein monofilament wire test within normal limits  bilaterally. Muscle power within normal limits bilaterally. ° °Nails Thick disfigured discolored nails with subungual debris  from hallux to fifth toes bilaterally. No evidence of bacterial infection or drainage bilaterally. ° °Orthopedic  No limitations of motion  feet .  No crepitus or effusions noted.  No bony pathology or digital deformities noted. ° °Skin  normotropic skin with no porokeratosis noted bilaterally.  No signs of infections or ulcers noted.   Callus and fizzures developing sub 5th met right foot.  Porokeratosis medial aspect left foot. ° °Onychomycosis  Pain in right toes  Pain in left toes ° °Consent was obtained for treatment procedures.   Mechanical debridement of nails 1-5  bilaterally performed with a nail nipper.  Filed with dremel without incident. Debride porokeratosis with # 15 blade left foot. ° ° °Return office visit   3 months                 Told patient to return for periodic foot care and evaluation due to potential at risk complications. ° ° °Alaa Mullally [name]  °

## 2021-07-16 ENCOUNTER — Other Ambulatory Visit: Payer: Self-pay | Admitting: "Endocrinology

## 2021-07-16 ENCOUNTER — Telehealth: Payer: Self-pay

## 2021-07-16 ENCOUNTER — Other Ambulatory Visit: Payer: Self-pay | Admitting: Cardiology

## 2021-07-16 DIAGNOSIS — Z0000 Encounter for general adult medical examination without abnormal findings: Secondary | ICD-10-CM

## 2021-07-16 NOTE — Telephone Encounter (Signed)
Called patient per his request to reestablish health coaching. Patient does not have a voicemail set up to leave a message. Called Melvin Ray to inform him that I had tried to contact the patient but was unsuccessful. Provided Melvin Ray my contact number to give to patient to call me directly at his earliest convenience.    Pamala Hayman Nedra Hai, [facility] Guide, Health Coach [address] [place] [address] Telephone: [phone] Email: [email or web address]

## 2021-07-23 LAB — LIPID PANEL
Cholesterol: 169 (ref 0–200)
HDL: 36 (ref 35–70)
LDL Cholesterol: 95
Triglycerides: 264 — AB (ref 40–160)

## 2021-07-23 LAB — HEMOGLOBIN A1C: Hemoglobin A1C: 10.2

## 2021-07-23 LAB — COMPREHENSIVE METABOLIC PANEL
Albumin: 3.9 (ref 3.5–5.0)
Calcium: 9.5 (ref 8.7–10.7)

## 2021-07-23 LAB — BASIC METABOLIC PANEL
BUN: 15 (ref 4–21)
CO2: 28 — AB (ref 13–22)
Chloride: 96 — AB (ref 99–108)
Creatinine: 1.2 (ref 0.6–1.3)
Glucose: 240
Potassium: 3.5 mEq/L (ref 3.5–5.1)
Sodium: 138 (ref 137–147)

## 2021-07-23 LAB — MICROALBUMIN, URINE: Microalb, Ur: 7.8

## 2021-07-23 LAB — TSH: TSH: 4.81 (ref 0.41–5.90)

## 2021-07-29 ENCOUNTER — Ambulatory Visit (INDEPENDENT_AMBULATORY_CARE_PROVIDER_SITE_OTHER): Payer: Medicaid Other | Admitting: Gastroenterology

## 2021-07-29 ENCOUNTER — Encounter (INDEPENDENT_AMBULATORY_CARE_PROVIDER_SITE_OTHER): Payer: Self-pay | Admitting: Gastroenterology

## 2021-07-29 VITALS — BP 122/79 | HR 74 | Temp 98.2°F | Ht 72.0 in | Wt >= 6400 oz

## 2021-07-29 DIAGNOSIS — D509 Iron deficiency anemia, unspecified: Secondary | ICD-10-CM | POA: Insufficient documentation

## 2021-07-29 NOTE — Patient Instructions (Signed)
It was nice to meet you!  We will get you scheduled for EGD and colonoscopy for further evaluation of your low blood counts. We can schedule this in august per your preference, however, if you decide that you would like to complete this evaluation sooner, please let me know.  Follow up after procedures

## 2021-07-29 NOTE — Progress Notes (Unsigned)
Referring Provider: Leonie Douglas, MD Primary Care Physician:  Leonie Douglas, MD Primary GI Physician: new  Chief Complaint  Patient presents with   Anemia    Patient arrives with social worker Melvin Ray.  New patient, referred for anemia. Has been having some dizziness.    HPI:   Melvin Ray is a 36 y.o. male with past medical history of DM, HTN, morbid obesity, Sleep apnea, CKD stage 3a.   Patient presenting today as a new patient for IDA.  Last labs in January with hgb 11.5, iron studies with ferritin 95, iron 43, TIBC 284, iron saturation 15. Per review of chart, it appears that hgb has been low since 2011 with baseline around 10-11 range, though has had a few as low as 7.3 (2020).  Patient arrives with social worker Melvin Ray who helps provide some history. patient reports that he has had low blood counts for the past 2 years though does not think he has required any blood transfusions. He denies any rectal bleeding or melena. Denies recent abdominal pain. Denies changes in appetite or early satiety. Had some nausea previously, with vomiting. States that this would occur maybe once per week, does not think this has occurred in a few months. Previously had issues with heartburn but since starting protonix, symptoms have resolved. He is having a BM twice a day. Denies diarrhea. He does endorse dizziness on occasion. Reports that he feels he has fatigue often but is not sure when this began. He is maintained on daily iron pills.   NSAID use: uses ibuprofen a few times per week for the past year Social [id number] alcohol use, he does smoke cigars a few times per week.  Fam hx: denies any family hx of crc or liver disease   Last Colonoscopy:never Last Endoscopy: pt thinks he has had EGD previously, maybe in 2021  Recommendations:    Past Medical History:  Diagnosis Date   Diabetes mellitus without complication ([place])    Hypertension    Morbid obesity ([place])    Obstructive  sleep apnea    Onychomycosis    Tobacco abuse     Past Surgical History:  Procedure Laterality Date   APPENDECTOMY      Current Outpatient Medications  Medication Sig Dispense Refill   ACCU-CHEK GUIDE test strip 3 (three) times daily.     Accu-Chek Softclix Lancets lancets 3 (three) times daily.     albuterol (VENTOLIN HFA) 108 (90 Base) MCG/ACT inhaler Inhale into the lungs every 6 (six) hours as needed for wheezing or shortness of breath.     chlorthalidone (HYGROTON) 25 MG tablet TAKE 1 TABLET ONCE DAILY. 90 tablet 3   Cholecalciferol 50 MCG (2000 UT) CAPS Take 1 capsule (2,000 Units total) by mouth daily with breakfast. 90 capsule 1   cloNIDine (CATAPRES) 0.1 MG tablet Take 0.1 mg by mouth 2 (two) times daily.     diltiazem (CARDIZEM CD) 240 MG 24 hr capsule Take 240 mg by mouth daily.     ferrous sulfate 325 (65 FE) MG tablet Take 325 mg by mouth daily with breakfast.     FLUoxetine (PROZAC) 20 MG tablet Take 20 mg by mouth daily.     Fluticasone-Salmeterol (ADVAIR) 250-50 MCG/DOSE AEPB Inhale 1 puff into the lungs daily.     furosemide (LASIX) 20 MG tablet TAKE 1 TABLET ONCE DAILY. 14 tablet 0   glipiZIDE (GLUCOTROL XL) 10 MG 24 hr tablet TAKE 1 TABLET ONCE DAILY WITH BREAKFAST. 90 tablet  0   hydrALAZINE (APRESOLINE) 50 MG tablet Take 1 tablet (50 mg total) by mouth 3 (three) times daily. (Patient taking differently: Take 100 mg by mouth 3 (three) times daily.) 270 tablet 3   ibuprofen (ADVIL) 400 MG tablet Take 400 mg by mouth 3 (three) times daily.     insulin glargine (LANTUS SOLOSTAR) 100 UNIT/ML Solostar Pen Inject 80 Units into the skin at bedtime. 30 mL 2   losartan (COZAAR) 100 MG tablet Take 100 mg by mouth daily.     metFORMIN (GLUCOPHAGE-XR) 500 MG 24 hr tablet TAKE 2 TABLETS ONCE DAILY WITH BREAKFAST. 180 tablet 0   metoprolol (TOPROL-XL) 200 MG 24 hr tablet Take 200 mg by mouth daily.     pantoprazole (PROTONIX) 40 MG tablet Take 40 mg by mouth daily.     potassium  chloride (KLOR-CON) 10 MEQ tablet Take 10 mEq by mouth daily.     traZODone (DESYREL) 100 MG tablet Take 200 mg by mouth at bedtime.      valsartan (DIOVAN) 320 MG tablet TAKE 1 TABLET ONCE DAILY. (Patient not taking: Reported on 07/29/2021) 90 tablet 1   No current facility-administered medications for this visit.    Allergies as of 07/29/2021   (No Known Allergies)    Family History  Problem Relation Age of Onset   Heart failure Father    Hypertension Father    Diabetes Maternal Grandmother    Heart failure Paternal Grandmother        transplant    Social History   Socioeconomic History   Marital status: Single    Spouse name: Not on file   Number of children: Not on file   Years of education: Not on file   Highest education level: Not on file  Occupational History   Not on file  Tobacco Use   Smoking status: Some Days    Types: Cigars    Passive exposure: Current   Smokeless tobacco: Former  Building services engineer Use: Every day  Substance and Sexual Activity   Alcohol use: Never   Drug use: Never   Sexual activity: Not on file  Other Topics Concern   Not on file  Social History Narrative   Not on file   Social Determinants of Health   Financial Resource Strain: Not on file  Food Insecurity: Not on file  Transportation Needs: Not on file  Physical Activity: Insufficiently Active   Days of Exercise per Week: 3 days   Minutes of Exercise per Session: 40 min  Stress: Not on file  Social Connections: Not on file   Review of systems General: negative for night sweats, fever, chills, weight loss+fatigue Neck: Negative for lumps, goiter, pain and significant neck swelling Resp: Negative for cough, wheezing, dyspnea at rest CV: Negative for chest pain, leg swelling, palpitations, orthopnea GI: denies melena, hematochezia, nausea, vomiting, diarrhea, constipation, dysphagia, odyonophagia, early satiety or unintentional weight loss.  MSK: Negative for joint pain or  swelling, back pain, and muscle pain. Derm: Negative for itching or rash Psych: Denies depression, anxiety, memory loss, confusion. No homicidal or suicidal ideation.  Heme: Negative for prolonged bleeding, bruising easily, and swollen nodes. Endocrine: Negative for cold or heat intolerance, polyuria, polydipsia and goiter. Neuro: negative for tremor, gait imbalance, syncope and seizures. The remainder of the review of systems is noncontributory.  Physical Exam: BP 122/79 (BP Location: Left Arm, Patient Position: Sitting, Cuff Size: Large)   Pulse 74   Temp 98.2 F (36.8  C) (Oral)   Ht 6' (1.829 m)   Wt (!) 438 lb 11.2 oz (199 kg)   BMI 59.50 kg/m  General:   Alert and oriented. No distress noted. Pleasant and cooperative.  Head:  Normocephalic and atraumatic. Eyes:  Conjuctiva clear without scleral icterus. Mouth:  Oral mucosa pink and moist. Good dentition. No lesions. Heart: Normal rate and rhythm, s1 and s2 heart sounds present.  Lungs: Clear lung sounds in all lobes. Respirations equal and unlabored. Abdomen:  +BS, soft, non-tender and non-distended. No rebound or guarding. No HSM or masses noted. Derm: No palmar erythema or jaundice Msk:  Symmetrical without gross deformities. Normal posture. Extremities:  Without edema. Neurologic:  Alert and  oriented x4 Psych:  Alert and cooperative. Normal mood and affect.  Invalid input(s): 6 MONTHS   ASSESSMENT: Melvin Ray is a 36 y.o. male presenting today as a new patient for IDA.  Notably with anemia since 2011, per chart review, baseline appears to be 10-11 range overall with a few levels as low as 7.3(2020) though patient denies any previous blood transfusions. January with hgb 11.5, iron studies with ferritin 95, iron 43, TIBC 284, iron saturation 15, he is maintained on oral iron. He denies rectal bleeding, melena, weight loss, changes in appetite, early satiety, abdominal pain. Does endorse some nausea on occasion though this  seems improved since starting protonix 40mg  daily for GERD. He does take ibuprofen a few times per week. Suspect some aspect of anemia could be secondary to hx of CKD stage 3a, however, I discussed recommendations of proceeding with EGD and Colonoscopy for further determination. Patient hesitant to proceed with colonoscopy as he states he does not want anything inside of his rectum, however, I discussed the procedures in depth with the patient as well as possible etiology of his anemia as we cannot rule out PUD, gastritis, duodenitis, bleeding polyps, AVMs or malignancy, though this is less likely.  Ultimately, patient is agreeable to proceed with both EGD and colonoscopy, however, he prefers to wait until august if he is going to do both. I discussed with him that if he presents with any alarm symptoms, further drop in hgb, rectal bleeding or melena, we will need to consider proceeding with procedures sooner. Indications, risks and benefits of procedure discussed in detail with patient and his Education officer, museum. Patient and social worker verbalized understanding and patient is in agreement to proceed with EGD and Colonoscopy at this time.    PLAN:  Schedule EGD +Colonoscopy-ENDO3 2. Avoid NSAID use, especially in setting of CKD 3. Pt to make me aware of worsening anemia, rectal bleeding or melena  4. Continue oral iron 5. Continue daily PPI  All questions were answered, patient verbalized understanding and is in agreement with plan as outlined above.    Follow Up: TBD after procedures  Isaly Fasching L. Alver Sorrow, MSN, APRN, AGNP-C Adult-Gerontology Nurse Practitioner [facility]

## 2021-08-09 ENCOUNTER — Other Ambulatory Visit: Payer: Self-pay | Admitting: Cardiology

## 2021-08-14 ENCOUNTER — Ambulatory Visit (INDEPENDENT_AMBULATORY_CARE_PROVIDER_SITE_OTHER): Payer: Medicaid Other | Admitting: "Endocrinology

## 2021-08-14 ENCOUNTER — Encounter: Payer: Self-pay | Admitting: "Endocrinology

## 2021-08-14 ENCOUNTER — Telehealth: Payer: Self-pay | Admitting: "Endocrinology

## 2021-08-14 VITALS — BP 146/108 | HR 68 | Ht 72.0 in | Wt >= 6400 oz

## 2021-08-14 DIAGNOSIS — N182 Chronic kidney disease, stage 2 (mild): Secondary | ICD-10-CM

## 2021-08-14 DIAGNOSIS — Z794 Long term (current) use of insulin: Secondary | ICD-10-CM

## 2021-08-14 DIAGNOSIS — F17200 Nicotine dependence, unspecified, uncomplicated: Secondary | ICD-10-CM

## 2021-08-14 DIAGNOSIS — I10 Essential (primary) hypertension: Secondary | ICD-10-CM

## 2021-08-14 DIAGNOSIS — E1122 Type 2 diabetes mellitus with diabetic chronic kidney disease: Secondary | ICD-10-CM | POA: Diagnosis not present

## 2021-08-14 DIAGNOSIS — E782 Mixed hyperlipidemia: Secondary | ICD-10-CM

## 2021-08-14 NOTE — Telephone Encounter (Signed)
Johnny made aware

## 2021-08-14 NOTE — Patient Instructions (Signed)

## 2021-08-14 NOTE — Telephone Encounter (Signed)
Ether Griffins, patients care taker called and said that he wants to be able to exercise at the [facility] but medicaid will not cover transportation unless you give him something in writing. Please Advise if this is something you will do

## 2021-08-14 NOTE — Progress Notes (Signed)
08/14/2021, 6:30 PM  Endocrinology follow-up note   Subjective:    Patient ID: Melvin Ray, male    DOB: 1985-08-16.  Melvin Ray is being seen in follow up after he was sen in consultation for management of currently uncontrolled symptomatic diabetes requested by  Leonie Douglas, MD. He is assisted and accompanied by his case manager Johnny.   Past Medical History:  Diagnosis Date   Diabetes mellitus without complication ([place])    Hypertension    Morbid obesity ([place])    Obstructive sleep apnea    Onychomycosis    Tobacco abuse     Past Surgical History:  Procedure Laterality Date   APPENDECTOMY      Social History   Socioeconomic History   Marital status: Single    Spouse name: Not on file   Number of children: Not on file   Years of education: Not on file   Highest education level: Not on file  Occupational History   Not on file  Tobacco Use   Smoking status: Some Days    Types: Cigars    Passive exposure: Current   Smokeless tobacco: Former  Scientific laboratory technician Use: Every day  Substance and Sexual Activity   Alcohol use: Never   Drug use: Never   Sexual activity: Not on file  Other Topics Concern   Not on file  Social History Narrative   Not on file   Social Determinants of Health   Financial Resource Strain: Low Risk  (03/22/2020)   Overall Financial Resource Strain (CARDIA)    Difficulty of Paying Living Expenses: Not very hard  Food Insecurity: No Food Insecurity (03/22/2020)   Hunger Vital Sign    Worried About Running Out of Food in the Last Year: Never true    Ran Out of Food in the Last Year: Never true  Transportation Needs: Unmet Transportation Needs (03/22/2020)   PRAPARE - Transportation    Lack of Transportation (Medical): Yes    Lack of Transportation (Non-Medical): Yes  Physical Activity: Insufficiently Active (07/31/2020)   Exercise Vital Sign    Days  of Exercise per Week: 3 days    Minutes of Exercise per Session: 40 min  Stress: Not on file  Social Connections: Not on file    Family History  Problem Relation Age of Onset   Heart failure Father    Hypertension Father    Diabetes Maternal Grandmother    Heart failure Paternal Grandmother        transplant    Outpatient Encounter Medications as of 08/14/2021  Medication Sig   ACCU-CHEK GUIDE test strip 3 (three) times daily.   Accu-Chek Softclix Lancets lancets 3 (three) times daily.   albuterol (VENTOLIN HFA) 108 (90 Base) MCG/ACT inhaler Inhale into the lungs every 6 (six) hours as needed for wheezing or shortness of breath.   chlorthalidone (HYGROTON) 25 MG tablet TAKE 1 TABLET ONCE DAILY.   Cholecalciferol 50 MCG (2000 UT) CAPS Take 1 capsule (2,000 Units total) by mouth daily with breakfast.   cloNIDine (CATAPRES) 0.3 MG tablet Take 0.3 mg by mouth  2 (two) times daily.   diltiazem (CARDIZEM CD) 240 MG 24 hr capsule Take 240 mg by mouth daily.   ferrous sulfate 325 (65 FE) MG tablet Take 325 mg by mouth daily with breakfast.   FLUoxetine (PROZAC) 20 MG tablet Take 20 mg by mouth daily.   Fluticasone-Salmeterol (ADVAIR) 250-50 MCG/DOSE AEPB Inhale 1 puff into the lungs daily.   furosemide (LASIX) 20 MG tablet TAKE 1 TABLET ONCE DAILY.   glipiZIDE (GLUCOTROL XL) 10 MG 24 hr tablet TAKE 1 TABLET ONCE DAILY WITH BREAKFAST.   hydrALAZINE (APRESOLINE) 50 MG tablet Take 1 tablet (50 mg total) by mouth 3 (three) times daily. (Patient taking differently: Take 100 mg by mouth 3 (three) times daily.)   ibuprofen (ADVIL) 400 MG tablet Take 400 mg by mouth 3 (three) times daily.   insulin glargine (LANTUS SOLOSTAR) 100 UNIT/ML Solostar Pen Inject 80 Units into the skin at bedtime.   losartan (COZAAR) 100 MG tablet Take 100 mg by mouth daily.   metFORMIN (GLUCOPHAGE-XR) 500 MG 24 hr tablet TAKE 2 TABLETS ONCE DAILY WITH BREAKFAST.   metoprolol (TOPROL-XL) 200 MG 24 hr tablet Take 200 mg by  mouth daily.   pantoprazole (PROTONIX) 40 MG tablet Take 40 mg by mouth daily.   potassium chloride (KLOR-CON) 10 MEQ tablet Take 10 mEq by mouth daily.   traZODone (DESYREL) 100 MG tablet Take 200 mg by mouth at bedtime.    [DISCONTINUED] cloNIDine (CATAPRES) 0.1 MG tablet Take 0.1 mg by mouth 2 (two) times daily.   [DISCONTINUED] valsartan (DIOVAN) 320 MG tablet TAKE 1 TABLET ONCE DAILY. (Patient not taking: Reported on 07/29/2021)   No facility-administered encounter medications on file as of 08/14/2021.    ALLERGIES: No Known Allergies  VACCINATION STATUS:  There is no immunization history on file for this patient.  Diabetes He presents for his follow-up diabetic visit. He has type 2 diabetes mellitus. Onset time: He was diagnosed through approximate age of 12 years. His disease course has been worsening. There are no hypoglycemic associated symptoms. Pertinent negatives for hypoglycemia include no headaches, seizures or tremors. Pertinent negatives for diabetes include no blurred vision, no chest pain, no polydipsia, no polyuria and no weight loss. There are no hypoglycemic complications. Symptoms are worsening. Diabetic complications include nephropathy. Risk factors for coronary artery disease include dyslipidemia, diabetes mellitus, family history, male sex, obesity, hypertension, tobacco exposure and sedentary lifestyle. Current diabetic treatment includes insulin injections. His weight is fluctuating minimally. He is following a generally unhealthy diet. When asked about meal planning, he reported none. He has not had a previous visit with a dietitian. He never participates in exercise. His home blood glucose trend is increasing steadily. His overall blood glucose range is 140-180 mg/dl. Melvin Ray presents with incomplete monitoring of blood glucose.  He presents with a meter showing less than 1 readings per day on average.  He has 10 readings the last 14 days averaging 175, at fasting.  His  recent labs show A1c of 10.2%, increasing from 8.6%.  He did not document any hypoglycemia.     ) An ACE inhibitor/angiotensin II receptor blocker is being taken. He does not see a podiatrist.Eye exam is not current.  Hyperlipidemia This is a chronic problem. The current episode started more than 1 year ago. Exacerbating diseases include chronic renal disease, diabetes and obesity. Pertinent negatives include no chest pain, myalgias or shortness of breath. Risk factors for coronary artery disease include dyslipidemia, diabetes mellitus, family history, obesity, male sex,  hypertension and a sedentary lifestyle.  Hypertension This is a chronic problem. The current episode started more than 1 year ago. Pertinent negatives include no blurred vision, chest pain, headaches, palpitations or shortness of breath. Risk factors for coronary artery disease include dyslipidemia, diabetes mellitus, family history, obesity, male gender, sedentary lifestyle and smoking/tobacco exposure. Past treatments include central alpha agonists and angiotensin blockers. Identifiable causes of hypertension include chronic renal disease.     Review of Systems  Constitutional:  Negative for chills, fever and weight loss.  Eyes:  Negative for blurred vision.  Respiratory:  Negative for cough and shortness of breath.   Cardiovascular:  Negative for chest pain and palpitations.       No Shortness of breath  Gastrointestinal:  Negative for abdominal pain, diarrhea, nausea and vomiting.  Endocrine: Negative for polydipsia and polyuria.  Genitourinary:  Negative for frequency, hematuria and urgency.  Musculoskeletal:  Negative for myalgias.  Skin:  Negative for rash.  Neurological:  Negative for tremors, seizures and headaches.  Hematological:  Does not bruise/bleed easily.  Psychiatric/Behavioral:  Negative for hallucinations and suicidal ideas.     Objective:       08/14/2021    1:55 PM 07/29/2021    3:32 PM 05/14/2021     2:14 PM  Vitals with BMI  Height 6\' 0"  6\' 0"  6\' 0"   Weight 440 lbs 3 oz 438 lbs 11 oz 449 lbs 13 oz  BMI 59.69 99991111 0000000  Systolic 123456 123XX123 [id number]  Diastolic 123XX123 79 96  Pulse 68 74 64    BP (!) 146/108   Pulse 68   Ht 6' (1.829 m)   Wt (!) 440 lb 3.2 oz (199.7 kg)   BMI 59.70 kg/m   Wt Readings from Last 3 Encounters:  08/14/21 (!) 440 lb 3.2 oz (199.7 kg)  07/29/21 (!) 438 lb 11.2 oz (199 kg)  05/14/21 (!) 449 lb 12.8 oz (204 kg)       Diabetic Labs (most recent): Lab Results  Component Value Date   HGBA1C 10.2 07/23/2021   HGBA1C 8.6 (A) 05/14/2021   HGBA1C 8.4 12/27/2020   MICROALBUR 7.8 07/23/2021      Recent Results (from the past 2160 hour(s))  HM DIABETES EYE EXAM     Status: None   Collection Time: 05/21/21 12:00 AM  Result Value Ref Range   HM Diabetic Eye Exam No Retinopathy No Retinopathy  Microalbumin, urine     Status: None   Collection Time: 07/23/21 12:00 AM  Result Value Ref Range   Microalb, Ur 7.8   Basic metabolic panel     Status: Abnormal   Collection Time: 07/23/21 12:00 AM  Result Value Ref Range   Glucose 240    BUN 15 4 - 21   CO2 28 (A) 13 - 22   Creatinine 1.2 0.6 - 1.3   Potassium 3.5 3.5 - 5.1 mEq/L   Sodium 138 137 - 147   Chloride 96 (A) 99 - 108  Comprehensive metabolic panel     Status: None   Collection Time: 07/23/21 12:00 AM  Result Value Ref Range   Calcium 9.5 8.7 - 10.7   Albumin 3.9 3.5 - 5.0  Lipid panel     Status: Abnormal   Collection Time: 07/23/21 12:00 AM  Result Value Ref Range   Triglycerides 264 (A) 40 - 160   Cholesterol 169 0 - 200   HDL 36 35 - 70   LDL Cholesterol 95   Hemoglobin  A1c     Status: None   Collection Time: 07/23/21 12:00 AM  Result Value Ref Range   Hemoglobin A1C 10.2   TSH     Status: None   Collection Time: 07/23/21 12:00 AM  Result Value Ref Range   TSH 4.81 0.41 - 5.90    Comment: T4,Free 1.3     Assessment & Plan:   1. Type 2 diabetes mellitus with stage 2 chronic  kidney disease, with long-term current use of insulin (HCC)   - Melvin Ray has currently uncontrolled symptomatic type 2 DM since  36 years of age. Recent labs reviewed.  Melvin Ray presents with incomplete monitoring of blood glucose.  He presents with a meter showing less than 1 readings per day on average.  He has 10 readings the last 14 days averaging 175, at fasting.  His recent labs show A1c of 10.2%, increasing from 8.6%.  He did not document any hypoglycemia.     - I had a long discussion with him about the progressive nature of diabetes and the pathology behind its complications. -his diabetes is complicated by CKD, obesity/sedentary life, smoking and he remains at a high risk for more acute and chronic complications which include CAD, CVA, CKD, retinopathy, and neuropathy. These are all discussed in detail with him.  - I have counseled him on diet  and weight management  by adopting a carbohydrate restricted/protein rich diet. Patient is encouraged to switch to  unprocessed or minimally processed     complex starch and increased protein intake (animal or plant source), fruits, and vegetables. -  he is advised to stick to a routine mealtimes to eat 3 meals  a day and avoid unnecessary snacks ( to snack only to correct hypoglycemia).  -He has regained most of the weight he lost when he was dealing with severe hyperglycemia with glycosuria.   - he acknowledges that there is a room for improvement in his food and drink choices. - Suggestion is made for him to avoid simple carbohydrates  from his diet including Cakes, Sweet Desserts, Ice Cream, Soda (diet and regular), Sweet Tea, Candies, Chips, Cookies, Store Bought Juices, Alcohol in Excess of  1-2 drinks a day, Artificial Sweeteners,  Coffee Creamer, and "Sugar-free" Products, Lemonade. This will help patient to have more stable blood glucose profile and potentially avoid unintended weight gain.  - he has been  scheduled with Norm Salt,  RDN, CDE for diabetes education.  - I have approached him with the following individualized plan to manage  his diabetes and patient agrees:   -In light of his presentation with significantly above target glycemic profile, he will continue to need insulin treatment.  He will be switched to premixed insulin to use twice a day from his Lantus.  Since he has a large supply of Lantus at his house, he is advised to continue Lantus 80 units nightly, start monitoring blood glucose twice a day-daily before breakfast and at bedtime and return in 9 weeks with his leftover Lantus before switching him to premixed insulin utilizing NovoLog 70/30 or Humalog 75/25.   He is at risk of insulin mixup if he gets duplicate prescriptions without removing the existing supplies. In the meantime, he is advised to continue metformin 1000 mg p.o. once a day, glipizide 10 mg XL p.o. daily at breakfast.   he is encouraged to call clinic for blood glucose levels less than 70 or above 200 mg /dl.  - he is warned not to take  insulin without proper monitoring per orders.  -He is counseled for  smoking cessation, he is too high risk to's give incretin therapy due to his risk of pancreatitis. Tragically, patient continues to smoke.  He has obstructive sleep apnea would benefit the most from weight loss and smoking cessation. The patient was counseled on the dangers of tobacco use, and was advised to quit.  Reviewed strategies to maximize success, including removing cigarettes and smoking materials from environment.   - Specific targets for  A1c;  LDL, HDL,  and Triglycerides were discussed with the patient.  2) Blood Pressure /Hypertension: His blood pressure is not controlled to target.  He did not take his blood pressure medication today since he just woke up.      he is advised to continue his current medications including  chlorthalidone 25 mg p.o. daily, clonidine 0.1 mg p.o. twice daily, hydralazine 50 mg p.o. 3 times  daily, Losartan 100 mg p.o. every morning, metoprolol 200 mg p.o. daily.   Admittedly, he consumes salty food, advised to limit salt consumption.   3) Lipids/Hyperlipidemia: Recent lipid panel showed LDL still high at 95.   He is not open for new medications at the moment.  He will be reapproached for statin intervention on subsequent visit.    4)  Weight/Diet:  Body mass index is 59.7 kg/m.  -   clearly complicating his diabetes care.   he is  a candidate for modest weight loss. I discussed with him the fact that loss of 5 - 10% of his  current body weight will have the most impact on his diabetes management.  Exercise, and detailed carbohydrates information provided  -  detailed on discharge instructions.  5) Chronic Care/Health Maintenance:  -he  is on ARB medications and  is encouraged to initiate and continue to follow up with Ophthalmology, Dentist,  Podiatrist at least yearly or according to recommendations, and advised to  quit smoking. I have recommended yearly flu vaccine and pneumonia vaccine at least every 5 years; moderate intensity exercise for up to 150 minutes weekly; and  sleep for at least 7 hours a day.  - he is  advised to maintain close follow up with Leonie Douglas, MD for primary care needs, as well as his other providers for optimal and coordinated care.    I spent 41 minutes in the care of the patient today including review of labs from [place], Lipids, Thyroid Function, Hematology (current and previous including abstractions from other facilities); face-to-face time discussing  his blood glucose readings/logs, discussing hypoglycemia and hyperglycemia episodes and symptoms, medications doses, his options of short and long term treatment based on the latest standards of care / guidelines;  discussion about incorporating lifestyle medicine;  and documenting the encounter.    Please refer to Patient Instructions for Blood Glucose Monitoring and Insulin/Medications Dosing  Guide"  in media tab for additional information. Please  also refer to " Patient Self Inventory" in the Media  tab for reviewed elements of pertinent patient history.  Melvin Ray participated in the discussions, expressed understanding, and voiced agreement with the above plans.  All questions were answered to his satisfaction. he is encouraged to contact clinic should he have any questions or concerns prior to his return visit.    Follow up plan: - Return in about 9 weeks (around 10/16/2021) for Bring Meter/CGM Device/Logs- A1c in Office.  Glade Lloyd, MD [facility] [address] [place], [place] [address]  Phone: [phone]  Fax: [phone]    08/14/2021, 6:30 PM  This note was partially dictated with voice recognition software. Similar sounding words can be transcribed inadequately or may not  be corrected upon review.

## 2021-08-28 ENCOUNTER — Other Ambulatory Visit: Payer: Self-pay | Admitting: Cardiology

## 2021-09-22 ENCOUNTER — Other Ambulatory Visit: Payer: Self-pay | Admitting: "Endocrinology

## 2021-10-09 ENCOUNTER — Ambulatory Visit: Payer: Medicaid Other | Admitting: Podiatry

## 2021-10-11 ENCOUNTER — Encounter: Payer: Self-pay | Admitting: Podiatry

## 2021-10-11 ENCOUNTER — Ambulatory Visit: Payer: Medicaid Other | Admitting: Podiatry

## 2021-10-11 ENCOUNTER — Ambulatory Visit (INDEPENDENT_AMBULATORY_CARE_PROVIDER_SITE_OTHER): Payer: Medicaid Other | Admitting: Podiatry

## 2021-10-11 DIAGNOSIS — M79674 Pain in right toe(s): Secondary | ICD-10-CM

## 2021-10-11 DIAGNOSIS — E1159 Type 2 diabetes mellitus with other circulatory complications: Secondary | ICD-10-CM

## 2021-10-11 DIAGNOSIS — B351 Tinea unguium: Secondary | ICD-10-CM | POA: Diagnosis not present

## 2021-10-11 DIAGNOSIS — M79675 Pain in left toe(s): Secondary | ICD-10-CM

## 2021-10-11 DIAGNOSIS — Q828 Other specified congenital malformations of skin: Secondary | ICD-10-CM

## 2021-10-11 NOTE — Progress Notes (Signed)
This patient returns to my office for at risk foot care.  This patient requires this care by a professional since this patient will be at risk due to having diabetes.   This patient is unable to cut nails himself since the patient cannot reach his nails.These nails are painful walking and wearing shoes.  This patient presents for at risk foot care today. His caregiver states he has developed a painful callus on the inside of left foot. He is brought to the office by caregiver from  [place]. ° °General Appearance  Alert, conversant and in no acute stress. ° °Vascular  Dorsalis pedis and posterior tibial  pulses are not  palpable  Bilaterally due to swelling..  Capillary return is within normal limits  bilaterally. Temperature is within normal limits  bilaterally. ° °Neurologic  Senn-Weinstein monofilament wire test within normal limits  bilaterally. Muscle power within normal limits bilaterally. ° °Nails Thick disfigured discolored nails with subungual debris  from hallux to fifth toes bilaterally. No evidence of bacterial infection or drainage bilaterally. ° °Orthopedic  No limitations of motion  feet .  No crepitus or effusions noted.  No bony pathology or digital deformities noted. ° °Skin  normotropic skin with no porokeratosis noted bilaterally.  No signs of infections or ulcers noted.   Callus and fizzures developing sub 5th met right foot.  Porokeratosis medial aspect left foot. ° °Onychomycosis  Pain in right toes  Pain in left toes ° °Consent was obtained for treatment procedures.   Mechanical debridement of nails 1-5  bilaterally performed with a nail nipper.  Filed with dremel without incident. Debride porokeratosis with # 15 blade left foot. ° ° °Return office visit   3 months                 Told patient to return for periodic foot care and evaluation due to potential at risk complications. ° ° °Mikai Meints [name]  °

## 2021-10-14 ENCOUNTER — Other Ambulatory Visit (INDEPENDENT_AMBULATORY_CARE_PROVIDER_SITE_OTHER): Payer: Self-pay

## 2021-10-14 ENCOUNTER — Telehealth (INDEPENDENT_AMBULATORY_CARE_PROVIDER_SITE_OTHER): Payer: Self-pay

## 2021-10-14 ENCOUNTER — Encounter (INDEPENDENT_AMBULATORY_CARE_PROVIDER_SITE_OTHER): Payer: Self-pay

## 2021-10-14 DIAGNOSIS — D509 Iron deficiency anemia, unspecified: Secondary | ICD-10-CM

## 2021-10-14 MED ORDER — PEG 3350-KCL-NA BICARB-NACL 420 G PO SOLR: 4000.0000 mL | ORAL | 0 refills | Status: DC

## 2021-10-14 NOTE — Telephone Encounter (Signed)
Melvin Ray, CMA  ?

## 2021-10-14 NOTE — Progress Notes (Deleted)
Cardiology Office Note    Date:  10/14/2021   ID:  Melvin Ray, DOB 1985/04/10, MRN [id number]   PCP:  Waldon Reining, MD   [facility]  Cardiologist:  Dina Rich, MD *** Advanced Practice Provider:  No care team member to display Electrophysiologist:  None   [phone]   No chief complaint on file.   History of Present Illness:  Melvin Ray is a 36 y.o. male with history of HTN, OSA, SVT in setting of DKA and hypokalemia, LE edema echo 2021 LVEF 60-65% indet diastolic function.     Past Medical History:  Diagnosis Date   Diabetes mellitus without complication (HCC)    Hypertension    Morbid obesity (HCC)    Obstructive sleep apnea    Onychomycosis    Tobacco abuse     Past Surgical History:  Procedure Laterality Date   APPENDECTOMY      Current Medications: No outpatient medications have been marked as taking for the 10/16/21 encounter (Appointment) with Dyann Kief, PA-C.     Allergies:   Patient has no known allergies.   Social History   Socioeconomic History   Marital status: Single    Spouse name: Not on file   Number of children: Not on file   Years of education: Not on file   Highest education level: Not on file  Occupational History   Not on file  Tobacco Use   Smoking status: Some Days    Types: Cigars    Passive exposure: Current   Smokeless tobacco: Former  Building services engineer Use: Every day  Substance and Sexual Activity   Alcohol use: Never   Drug use: Never   Sexual activity: Not on file  Other Topics Concern   Not on file  Social History Narrative   Not on file   Social Determinants of Health   Financial Resource Strain: Low Risk  (03/22/2020)   Overall Financial Resource Strain (CARDIA)    Difficulty of Paying Living Expenses: Not very hard  Food Insecurity: No Food Insecurity (03/22/2020)   Hunger Vital Sign    Worried About Running Out of Food in the Last Year: Never true    Ran Out of  Food in the Last Year: Never true  Transportation Needs: Unmet Transportation Needs (03/22/2020)   PRAPARE - Transportation    Lack of Transportation (Medical): Yes    Lack of Transportation (Non-Medical): Yes  Physical Activity: Insufficiently Active (07/31/2020)   Exercise Vital Sign    Days of Exercise per Week: 3 days    Minutes of Exercise per Session: 40 min  Stress: Not on file  Social Connections: Not on file     Family History:  The patient's ***family history includes Diabetes in his maternal grandmother; Heart failure in his father and paternal grandmother; Hypertension in his father.   ROS:   Please see the history of present illness.    ROS All other systems reviewed and are negative.   PHYSICAL EXAM:   VS:  There were no vitals taken for this visit.  Physical Exam  GEN: Well nourished, well developed, in no acute distress  HEENT: normal  Neck: no JVD, carotid bruits, or masses Cardiac:RRR; no murmurs, rubs, or gallops  Respiratory:  clear to auscultation bilaterally, normal work of breathing GI: soft, nontender, nondistended, + BS Ext: without cyanosis, clubbing, or edema, Good distal pulses bilaterally MS: no deformity or atrophy  Skin: warm and dry, no rash  Neuro:  Alert and Oriented x 3, Strength and sensation are intact Psych: euthymic mood, full affect  Wt Readings from Last 3 Encounters:  08/14/21 (!) 440 lb 3.2 oz (199.7 kg)  07/29/21 (!) 438 lb 11.2 oz (199 kg)  05/14/21 (!) 449 lb 12.8 oz (204 kg)      Studies/Labs Reviewed:   EKG:  EKG is*** ordered today.  The ekg ordered today demonstrates ***  Recent Labs: 05/09/2021: ALT 19 07/23/2021: BUN 15; Creatinine 1.2; Potassium 3.5; Sodium 138; TSH 4.81   Lipid Panel    Component Value Date/Time   CHOL 169 07/23/2021 0000   TRIG 264 (A) 07/23/2021 0000   HDL 36 07/23/2021 0000   LDLCALC 95 07/23/2021 0000    Additional studies/ records that were reviewed today include:  12/2019  echo IMPRESSIONS     1. Left ventricular ejection fraction, by estimation, is 60 to 65%. The  left ventricle has normal function. The left ventricle has no regional  wall motion abnormalities. The left ventricular internal cavity size was  mildly dilated. There is mild left  ventricular hypertrophy. Left ventricular diastolic parameters are  indeterminate.   2. Right ventricular systolic function is normal. The right ventricular  size is normal. Tricuspid regurgitation signal is inadequate for assessing  PA pressure.   3. The mitral valve is grossly normal. Trivial mitral valve  regurgitation.   4. The aortic valve is tricuspid. Aortic valve regurgitation is not  visualized.   5. The inferior vena cava is dilated in size with >50% respiratory  variability, suggesting right atrial pressure of 8 mmHg.        Risk Assessment/Calculations:   {Does this patient have ATRIAL FIBRILLATION?[phone]}     ASSESSMENT:    1. Essential hypertension   2. SVT (supraventricular tachycardia) (HCC)   3. Lower extremity edema   4. OSA (obstructive sleep apnea)      PLAN:  In order of problems listed above:  HTN  SVT in setting of DKA and hypokalemia  LE edema-echo 2021 normal LVEF 60-65%  OSA  Shared Decision Making/Informed Consent   {Are you ordering a CV Procedure (e.g. stress test, cath, DCCV, TEE, etc)?   Press F2        :[id number]}    Medication Adjustments/Labs and Tests Ordered: Current medicines are reviewed at length with the patient today.  Concerns regarding medicines are outlined above.  Medication changes, Labs and Tests ordered today are listed in the Patient Instructions below. There are no Patient Instructions on file for this visit.   Signed, Jacolyn Reedy, PA-C  10/14/2021 2:28 PM    [facility] [address] [place], [place], [place]  [address] Phone: [phone]; Fax: [phone]

## 2021-10-15 ENCOUNTER — Encounter (INDEPENDENT_AMBULATORY_CARE_PROVIDER_SITE_OTHER): Payer: Self-pay

## 2021-10-16 ENCOUNTER — Ambulatory Visit: Payer: Medicaid Other | Admitting: Physician Assistant

## 2021-10-16 ENCOUNTER — Ambulatory Visit: Payer: Medicaid Other | Admitting: "Endocrinology

## 2021-10-16 DIAGNOSIS — I10 Essential (primary) hypertension: Secondary | ICD-10-CM

## 2021-10-16 DIAGNOSIS — R600 Localized edema: Secondary | ICD-10-CM

## 2021-10-16 DIAGNOSIS — I471 Supraventricular tachycardia: Secondary | ICD-10-CM

## 2021-10-16 DIAGNOSIS — G4733 Obstructive sleep apnea (adult) (pediatric): Secondary | ICD-10-CM

## 2021-10-24 NOTE — Patient Instructions (Signed)
Melvin Ray  10/24/2021     @PREFPERIOPPHARMACY @   Your procedure is scheduled on 10/31/2021.   Report to [facility] at  1030  A.M.   Call this number if you have problems the morning of surgery:  [phone]   Remember:  Follow the diet and prep instructions given to you by the office.      Take 40 units of your night time lantus the night before your procedure.      DO NOT take any medications for diabetes the morning of your procedure.     Use your inhalers before you come and bring your rescue inhaler with you.     Take these medicines the morning of surgery with A SIP OF WATER                              catapres, cardiazem, prozac, toprol, protonix.     Do not wear jewelry, make-up or nail polish.  Do not wear lotions, powders, or perfumes, or deodorant.  Do not shave 48 hours prior to surgery.  Men may shave face and neck.  Do not bring valuables to the hospital.  [facility] is not responsible for any belongings or valuables.  Contacts, dentures or bridgework may not be worn into surgery.  Leave your suitcase in the car.  After surgery it may be brought to your room.  For patients admitted to the hospital, discharge time will be determined by your treatment team.  Patients discharged the day of surgery will not be allowed to drive home and must have someone with them for 24 hours.    Special instructions:   DO NOT smoke tobacco or vape for 24 hours before your procedure.   Please read over the following fact sheets that you were given. Anesthesia Post-op Instructions and Care and Recovery After Surgery      Upper Endoscopy, Adult, Care After After the procedure, it is common to have a sore throat. It is also common to have: Mild stomach pain or discomfort. Bloating. Nausea. Follow these instructions at home: The instructions below may help you care for yourself at home. Your health care provider may give you more instructions. If you have  questions, ask your health care provider. If you were given a sedative during the procedure, it can affect you for several hours. Do not drive or operate machinery until your health care provider says that it is safe. If you will be going home right after the procedure, plan to have a responsible adult: Take you home from the hospital or clinic. You will not be allowed to drive. Care for you for the time you are told. Follow instructions from your health care provider about what you may eat and drink. Return to your normal activities as told by your health care provider. Ask your health care provider what activities are safe for you. Take over-the-counter and prescription medicines only as told by your health care provider. Contact a health care provider if you: Have a sore throat that lasts longer than one day. Have trouble swallowing. Have a fever. Get help right away if you: Vomit blood or your vomit looks like coffee grounds. Have bloody, black, or tarry stools. Have a very bad sore throat or you cannot swallow. Have difficulty breathing or very bad pain in your chest or abdomen. These symptoms may be an emergency. Get help right  away. Call 911. Do not wait to see if the symptoms will go away. Do not drive yourself to the hospital. Summary After the procedure, it is common to have a sore throat, mild stomach discomfort, bloating, and nausea. If you were given a sedative during the procedure, it can affect you for several hours. Do not drive until your health care provider says that it is safe. Follow instructions from your health care provider about what you may eat and drink. Return to your normal activities as told by your health care provider. This information is not intended to replace advice given to you by your health care provider. Make sure you discuss any questions you have with your health care provider. Document Revised: 05/22/2021 Document Reviewed: 05/22/2021 Elsevier  Patient Education  2023 [employer]. Colonoscopy, Adult, Care After The following information offers guidance on how to care for yourself after your procedure. Your health care provider may also give you more specific instructions. If you have problems or questions, contact your health care provider. What can I expect after the procedure? After the procedure, it is common to have: A small amount of blood in your stool for 24 hours after the procedure. Some gas. Mild cramping or bloating of your abdomen. Follow these instructions at home: Eating and drinking  Drink enough fluid to keep your urine pale yellow. Follow instructions from your health care provider about eating or drinking restrictions. Resume your normal diet as told by your health care provider. Avoid heavy or fried foods that are hard to digest. Activity Rest as told by your health care provider. Avoid sitting for a long time without moving. Get up to take short walks every 1-2 hours. This is important to improve blood flow and breathing. Ask for help if you feel weak or unsteady. Return to your normal activities as told by your health care provider. Ask your health care provider what activities are safe for you. Managing cramping and bloating  Try walking around when you have cramps or feel bloated. If directed, apply heat to your abdomen as told by your health care provider. Use the heat source that your health care provider recommends, such as a moist heat pack or a heating pad. Place a towel between your skin and the heat source. Leave the heat on for 20-30 minutes. Remove the heat if your skin turns bright red. This is especially important if you are unable to feel pain, heat, or cold. You have a greater risk of getting burned. General instructions If you were given a sedative during the procedure, it can affect you for several hours. Do not drive or operate machinery until your health care provider says that it is  safe. For the first 24 hours after the procedure: Do not sign important documents. Do not drink alcohol. Do your regular daily activities at a slower pace than normal. Eat soft foods that are easy to digest. Take over-the-counter and prescription medicines only as told by your health care provider. Keep all follow-up visits. This is important. Contact a health care provider if: You have blood in your stool 2-3 days after the procedure. Get help right away if: You have more than a small spotting of blood in your stool. You have large blood clots in your stool. You have swelling of your abdomen. You have nausea or vomiting. You have a fever. You have increasing pain in your abdomen that is not relieved with medicine. These symptoms may be an emergency. Get help right  away. Call 911. Do not wait to see if the symptoms will go away. Do not drive yourself to the hospital. Summary After the procedure, it is common to have a small amount of blood in your stool. You may also have mild cramping and bloating of your abdomen. If you were given a sedative during the procedure, it can affect you for several hours. Do not drive or operate machinery until your health care provider says that it is safe. Get help right away if you have a lot of blood in your stool, nausea or vomiting, a fever, or increased pain in your abdomen. This information is not intended to replace advice given to you by your health care provider. Make sure you discuss any questions you have with your health care provider. Document Revised: 10/03/2020 Document Reviewed: 10/03/2020 Elsevier Patient Education  2023 Elsevier Inc. Monitored Anesthesia Care, Care After This sheet gives you information about how to care for yourself after your procedure. Your health care provider may also give you more specific instructions. If you have problems or questions, contact your health care provider. What can I expect after the  procedure? After the procedure, it is common to have: Tiredness. Forgetfulness about what happened after the procedure. Impaired judgment for important decisions. Nausea or vomiting. Some difficulty with balance. Follow these instructions at home: For the time period you were told by your health care provider:     Rest as needed. Do not participate in activities where you could fall or become injured. Do not drive or use machinery. Do not drink alcohol. Do not take sleeping pills or medicines that cause drowsiness. Do not make important decisions or sign legal documents. Do not take care of children on your own. Eating and drinking Follow the diet that is recommended by your health care provider. Drink enough fluid to keep your urine pale yellow. If you vomit: Drink water, juice, or soup when you can drink without vomiting. Make sure you have little or no nausea before eating solid foods. General instructions Have a responsible adult stay with you for the time you are told. It is important to have someone help care for you until you are awake and alert. Take over-the-counter and prescription medicines only as told by your health care provider. If you have sleep apnea, surgery and certain medicines can increase your risk for breathing problems. Follow instructions from your health care provider about wearing your sleep device: Anytime you are sleeping, including during daytime naps. While taking prescription pain medicines, sleeping medicines, or medicines that make you drowsy. Avoid smoking. Keep all follow-up visits as told by your health care provider. This is important. Contact a health care provider if: You keep feeling nauseous or you keep vomiting. You feel light-headed. You are still sleepy or having trouble with balance after 24 hours. You develop a rash. You have a fever. You have redness or swelling around the IV site. Get help right away if: You have trouble  breathing. You have new-onset confusion at home. Summary For several hours after your procedure, you may feel tired. You may also be forgetful and have poor judgment. Have a responsible adult stay with you for the time you are told. It is important to have someone help care for you until you are awake and alert. Rest as told. Do not drive or operate machinery. Do not drink alcohol or take sleeping pills. Get help right away if you have trouble breathing, or if you suddenly become confused.  This information is not intended to replace advice given to you by your health care provider. Make sure you discuss any questions you have with your health care provider. Document Revised: 01/15/2021 Document Reviewed: 01/13/2019 Elsevier Patient Education  2023 [employer].

## 2021-10-25 ENCOUNTER — Encounter (HOSPITAL_COMMUNITY): Payer: Self-pay

## 2021-10-25 ENCOUNTER — Encounter (HOSPITAL_COMMUNITY)
Admission: RE | Admit: 2021-10-25 | Discharge: 2021-10-25 | Disposition: A | Discharge status: Home / Self Care | Payer: Medicaid Other | Source: Ambulatory Visit | Attending: Gastroenterology | Admitting: Gastroenterology

## 2021-10-25 DIAGNOSIS — I10 Essential (primary) hypertension: Secondary | ICD-10-CM

## 2021-10-25 DIAGNOSIS — D509 Iron deficiency anemia, unspecified: Secondary | ICD-10-CM

## 2021-10-30 NOTE — Pre-Procedure Instructions (Signed)
Lorenza Cambridge messaged on 10/25/2021 that patient was a no show for pre-op.

## 2021-10-31 ENCOUNTER — Encounter (HOSPITAL_COMMUNITY): Admission: RE | Payer: Self-pay | Source: Home / Self Care

## 2021-10-31 ENCOUNTER — Ambulatory Visit (HOSPITAL_COMMUNITY): Admission: RE | Admit: 2021-10-31 | Payer: Medicaid Other | Source: Home / Self Care | Admitting: Gastroenterology

## 2021-10-31 SURGERY — COLONOSCOPY WITH PROPOFOL
Anesthesia: Monitor Anesthesia Care

## 2021-11-12 ENCOUNTER — Telehealth (INDEPENDENT_AMBULATORY_CARE_PROVIDER_SITE_OTHER): Payer: Self-pay

## 2021-11-12 ENCOUNTER — Encounter (INDEPENDENT_AMBULATORY_CARE_PROVIDER_SITE_OTHER): Payer: Self-pay

## 2021-11-12 ENCOUNTER — Other Ambulatory Visit (INDEPENDENT_AMBULATORY_CARE_PROVIDER_SITE_OTHER): Payer: Self-pay

## 2021-11-12 DIAGNOSIS — D509 Iron deficiency anemia, unspecified: Secondary | ICD-10-CM

## 2021-11-12 MED ORDER — PEG 3350-KCL-NA BICARB-NACL 420 G PO SOLR: 4000.0000 mL | ORAL | 0 refills | Status: DC

## 2021-11-12 NOTE — Telephone Encounter (Signed)
Reason Helzer Ann Nica Friske, CMA  ?

## 2021-11-13 ENCOUNTER — Encounter (INDEPENDENT_AMBULATORY_CARE_PROVIDER_SITE_OTHER): Payer: Self-pay

## 2021-12-18 ENCOUNTER — Other Ambulatory Visit: Payer: Self-pay | Admitting: "Endocrinology

## 2021-12-25 ENCOUNTER — Ambulatory Visit: Payer: Medicaid Other | Admitting: Podiatry

## 2022-01-01 NOTE — Patient Instructions (Signed)
Melvin Ray  01/01/2022     @PREFPERIOPPHARMACY @   Your procedure is scheduled on  01/07/2022.   Report to 01/09/2022 at  0815  A.M.   Call this number if you have problems the morning of surgery:  [phone]  If you experience any cold or flu symptoms such as cough, fever, chills, shortness of breath, etc. between now and your scheduled surgery, please notify [phone] at the above number.   Remember:  Follow the diet and prep instructions given to you by the office.     Take 40 units of your night time insulin the night before your procedure.      DO NOT take any medications for diabetes the morning of your procedure.     Take these medicines the morning of surgery with A SIP OF WATER             clonidine, diltiazem, prozac, protonix.     Do not wear jewelry, make-up or nail polish.  Do not wear lotions, powders, or perfumes, or deodorant.  Do not shave 48 hours prior to surgery.  Men may shave face and neck.  Do not bring valuables to the hospital.  [facility] is not responsible for any belongings or valuables.  Contacts, dentures or bridgework may not be worn into surgery.  Leave your suitcase in the car.  After surgery it may be brought to your room.  For patients admitted to the hospital, discharge time will be determined by your treatment team.  Patients discharged the day of surgery will not be allowed to drive home and must have someone with them for 24 hours.    Special instructions:   DO NOT smoke tobacco or vape for 24 hours before your procedure.  Please read over the following fact sheets that you were given. Anesthesia Post-op Instructions and Care and Recovery After Surgery      Colonoscopy, Adult, Care After The following information offers guidance on how to care for yourself after your procedure. Your health care provider may also give you more specific instructions. If you have problems or questions, contact your health care provider. What  can I expect after the procedure? After the procedure, it is common to have: A small amount of blood in your stool for 24 hours after the procedure. Some gas. Mild cramping or bloating of your abdomen. Follow these instructions at home: Eating and drinking  Drink enough fluid to keep your urine pale yellow. Follow instructions from your health care provider about eating or drinking restrictions. Resume your normal diet as told by your health care provider. Avoid heavy or fried foods that are hard to digest. Activity Rest as told by your health care provider. Avoid sitting for a long time without moving. Get up to take short walks every 1-2 hours. This is important to improve blood flow and breathing. Ask for help if you feel weak or unsteady. Return to your normal activities as told by your health care provider. Ask your health care provider what activities are safe for you. Managing cramping and bloating  Try walking around when you have cramps or feel bloated. If directed, apply heat to your abdomen as told by your health care provider. Use the heat source that your health care provider recommends, such as a moist heat pack or a heating pad. Place a towel between your skin and the heat source. Leave the heat on for 20-30 minutes. Remove the heat if your  skin turns bright red. This is especially important if you are unable to feel pain, heat, or cold. You have a greater risk of getting burned. General instructions If you were given a sedative during the procedure, it can affect you for several hours. Do not drive or operate machinery until your health care provider says that it is safe. For the first 24 hours after the procedure: Do not sign important documents. Do not drink alcohol. Do your regular daily activities at a slower pace than normal. Eat soft foods that are easy to digest. Take over-the-counter and prescription medicines only as told by your health care provider. Keep all  follow-up visits. This is important. Contact a health care provider if: You have blood in your stool 2-3 days after the procedure. Get help right away if: You have more than a small spotting of blood in your stool. You have large blood clots in your stool. You have swelling of your abdomen. You have nausea or vomiting. You have a fever. You have increasing pain in your abdomen that is not relieved with medicine. These symptoms may be an emergency. Get help right away. Call 911. Do not wait to see if the symptoms will go away. Do not drive yourself to the hospital. Summary After the procedure, it is common to have a small amount of blood in your stool. You may also have mild cramping and bloating of your abdomen. If you were given a sedative during the procedure, it can affect you for several hours. Do not drive or operate machinery until your health care provider says that it is safe. Get help right away if you have a lot of blood in your stool, nausea or vomiting, a fever, or increased pain in your abdomen. This information is not intended to replace advice given to you by your health care provider. Make sure you discuss any questions you have with your health care provider. Document Revised: 10/03/2020 Document Reviewed: 10/03/2020 Elsevier Patient Education  [place] After The following information offers guidance on how to care for yourself after your procedure. Your health care provider may also give you more specific instructions. If you have problems or questions, contact your health care provider. What can I expect after the procedure? After the procedure, it is common to have: Tiredness. Little or no memory about what happened during or after the procedure. Impaired judgment when it comes to making decisions. Nausea or vomiting. Some trouble with balance. Follow these instructions at home: For the time period you were told by your  health care provider:  Rest. Do not participate in activities where you could fall or become injured. Do not drive or use machinery. Do not drink alcohol. Do not take sleeping pills or medicines that cause drowsiness. Do not make important decisions or sign legal documents. Do not take care of children on your own. Medicines Take over-the-counter and prescription medicines only as told by your health care provider. If you were prescribed antibiotics, take them as told by your health care provider. Do not stop using the antibiotic even if you start to feel better. Eating and drinking Follow instructions from your health care provider about what you may eat and drink. Drink enough fluid to keep your urine pale yellow. If you vomit: Drink clear fluids slowly and in small amounts as you are able. Clear fluids include water, ice chips, low-calorie sports drinks, and fruit juice that has water added to it (diluted fruit  juice). Eat light and bland foods in small amounts as you are able. These foods include bananas, applesauce, rice, lean meats, toast, and crackers. General instructions  Have a responsible adult stay with you for the time you are told. It is important to have someone help care for you until you are awake and alert. If you have sleep apnea, surgery and some medicines can increase your risk for breathing problems. Follow instructions from your health care provider about wearing your sleep device: When you are sleeping. This includes during daytime naps. While taking prescription pain medicines, sleeping medicines, or medicines that make you drowsy. Do not use any products that contain nicotine or tobacco. These products include cigarettes, chewing tobacco, and vaping devices, such as e-cigarettes. If you need help quitting, ask your health care provider. Contact a health care provider if: You feel nauseous or vomit every time you eat or drink. You feel light-headed. You are still  sleepy or having trouble with balance after 24 hours. You get a rash. You have a fever. You have redness or swelling around the IV site. Get help right away if: You have trouble breathing. You have new confusion after you get home. These symptoms may be an emergency. Get help right away. Call 911. Do not wait to see if the symptoms will go away. Do not drive yourself to the hospital. This information is not intended to replace advice given to you by your health care provider. Make sure you discuss any questions you have with your health care provider. Document Revised: 07/08/2021 Document Reviewed: 07/08/2021 Elsevier Patient Education  [place].

## 2022-01-02 ENCOUNTER — Other Ambulatory Visit: Payer: Self-pay

## 2022-01-02 ENCOUNTER — Encounter (HOSPITAL_COMMUNITY): Payer: Self-pay

## 2022-01-02 ENCOUNTER — Encounter (HOSPITAL_COMMUNITY)
Admission: RE | Admit: 2022-01-02 | Discharge: 2022-01-02 | Disposition: A | Discharge status: Home / Self Care | Payer: Medicaid Other | Source: Ambulatory Visit | Attending: Gastroenterology | Admitting: Gastroenterology

## 2022-01-02 VITALS — BP 154/83 | HR 64 | Temp 97.7°F | Resp 18 | Ht 72.0 in | Wt >= 6400 oz

## 2022-01-02 DIAGNOSIS — F17200 Nicotine dependence, unspecified, uncomplicated: Secondary | ICD-10-CM | POA: Insufficient documentation

## 2022-01-02 DIAGNOSIS — D509 Iron deficiency anemia, unspecified: Secondary | ICD-10-CM | POA: Insufficient documentation

## 2022-01-02 DIAGNOSIS — Z01818 Encounter for other preprocedural examination: Secondary | ICD-10-CM | POA: Diagnosis present

## 2022-01-02 DIAGNOSIS — I10 Essential (primary) hypertension: Secondary | ICD-10-CM | POA: Diagnosis not present

## 2022-01-02 LAB — BASIC METABOLIC PANEL
Anion gap: 10 (ref 5–15)
BUN: 16 mg/dL (ref 6–20)
CO2: 28 mmol/L (ref 22–32)
Calcium: 9.5 mg/dL (ref 8.9–10.3)
Chloride: 103 mmol/L (ref 98–111)
Creatinine, Ser: 1.11 mg/dL (ref 0.61–1.24)
GFR, Estimated: >60 mL/min (ref >60)
Glucose, Bld: 107 mg/dL — ABNORMAL HIGH (ref 70–99)
Potassium: 3.2 mmol/L — ABNORMAL LOW (ref 3.5–5.1)
Sodium: 141 mmol/L (ref 135–145)

## 2022-01-02 LAB — CBC WITH DIFFERENTIAL/PLATELET
Abs Immature Granulocytes: 0.03 10*3/uL (ref 0.00–0.07)
Basophils Absolute: 0.1 10*3/uL (ref 0.0–0.1)
Basophils Relative: 1 %
Eosinophils Absolute: 0.1 10*3/uL (ref 0.0–0.5)
Eosinophils Relative: 1 %
HCT: 38.3 % — ABNORMAL LOW (ref 39.0–52.0)
Hemoglobin: 11.7 g/dL — ABNORMAL LOW (ref 13.0–17.0)
Immature Granulocytes: 0 %
Lymphocytes Relative: 33 %
Lymphs Abs: 2.6 10*3/uL (ref 0.7–4.0)
MCH: 26.4 pg (ref 26.0–34.0)
MCHC: 30.5 g/dL (ref 30.0–36.0)
MCV: 86.5 fL (ref 80.0–100.0)
Monocytes Absolute: 0.7 10*3/uL (ref 0.1–1.0)
Monocytes Relative: 9 %
Neutro Abs: 4.3 10*3/uL (ref 1.7–7.7)
Neutrophils Relative %: 56 %
Platelets: 209 10*3/uL (ref 150–400)
RBC: 4.43 MIL/uL (ref 4.22–5.81)
RDW: 16 % — ABNORMAL HIGH (ref 11.5–15.5)
WBC: 7.8 10*3/uL (ref 4.0–10.5)
nRBC: 0 % (ref 0.0–0.2)

## 2022-01-03 ENCOUNTER — Other Ambulatory Visit (INDEPENDENT_AMBULATORY_CARE_PROVIDER_SITE_OTHER): Payer: Self-pay | Admitting: Gastroenterology

## 2022-01-03 MED ORDER — POTASSIUM CHLORIDE CRYS ER 20 MEQ PO TBCR: 40.0000 meq | EXTENDED_RELEASE_TABLET | Freq: Every day | ORAL | 0 refills | Status: DC

## 2022-01-07 ENCOUNTER — Ambulatory Visit (HOSPITAL_COMMUNITY)
Admission: RE | Admit: 2022-01-07 | Discharge: 2022-01-07 | Disposition: A | Discharge status: Home / Self Care | Payer: Medicaid Other | Source: Ambulatory Visit | Attending: Gastroenterology | Admitting: Gastroenterology

## 2022-01-07 ENCOUNTER — Ambulatory Visit (HOSPITAL_BASED_OUTPATIENT_CLINIC_OR_DEPARTMENT_OTHER): Payer: Medicaid Other | Admitting: Anesthesiology

## 2022-01-07 ENCOUNTER — Ambulatory Visit (HOSPITAL_COMMUNITY): Payer: Medicaid Other | Admitting: Anesthesiology

## 2022-01-07 ENCOUNTER — Encounter (HOSPITAL_COMMUNITY): Payer: Self-pay | Admitting: Gastroenterology

## 2022-01-07 ENCOUNTER — Encounter (HOSPITAL_COMMUNITY)
Admission: RE | Disposition: A | Discharge status: Home / Self Care | Payer: Self-pay | Source: Ambulatory Visit | Attending: Gastroenterology

## 2022-01-07 DIAGNOSIS — D509 Iron deficiency anemia, unspecified: Secondary | ICD-10-CM | POA: Insufficient documentation

## 2022-01-07 DIAGNOSIS — Z7951 Long term (current) use of inhaled steroids: Secondary | ICD-10-CM | POA: Insufficient documentation

## 2022-01-07 DIAGNOSIS — K648 Other hemorrhoids: Secondary | ICD-10-CM | POA: Diagnosis not present

## 2022-01-07 DIAGNOSIS — F17290 Nicotine dependence, other tobacco product, uncomplicated: Secondary | ICD-10-CM | POA: Insufficient documentation

## 2022-01-07 DIAGNOSIS — I509 Heart failure, unspecified: Secondary | ICD-10-CM | POA: Insufficient documentation

## 2022-01-07 DIAGNOSIS — K635 Polyp of colon: Secondary | ICD-10-CM

## 2022-01-07 DIAGNOSIS — Z7984 Long term (current) use of oral hypoglycemic drugs: Secondary | ICD-10-CM | POA: Diagnosis not present

## 2022-01-07 DIAGNOSIS — Z794 Long term (current) use of insulin: Secondary | ICD-10-CM | POA: Insufficient documentation

## 2022-01-07 DIAGNOSIS — K5730 Diverticulosis of large intestine without perforation or abscess without bleeding: Secondary | ICD-10-CM | POA: Insufficient documentation

## 2022-01-07 DIAGNOSIS — K317 Polyp of stomach and duodenum: Secondary | ICD-10-CM

## 2022-01-07 DIAGNOSIS — E1122 Type 2 diabetes mellitus with diabetic chronic kidney disease: Secondary | ICD-10-CM | POA: Diagnosis not present

## 2022-01-07 DIAGNOSIS — G4733 Obstructive sleep apnea (adult) (pediatric): Secondary | ICD-10-CM | POA: Insufficient documentation

## 2022-01-07 DIAGNOSIS — N1831 Chronic kidney disease, stage 3a: Secondary | ICD-10-CM | POA: Diagnosis not present

## 2022-01-07 DIAGNOSIS — Z6841 Body Mass Index (BMI) 40.0 and over, adult: Secondary | ICD-10-CM | POA: Insufficient documentation

## 2022-01-07 DIAGNOSIS — F32A Depression, unspecified: Secondary | ICD-10-CM | POA: Diagnosis not present

## 2022-01-07 DIAGNOSIS — I129 Hypertensive chronic kidney disease with stage 1 through stage 4 chronic kidney disease, or unspecified chronic kidney disease: Secondary | ICD-10-CM | POA: Diagnosis not present

## 2022-01-07 HISTORY — PX: ESOPHAGOGASTRODUODENOSCOPY: SHX5428

## 2022-01-07 HISTORY — PX: COLONOSCOPY WITH PROPOFOL: SHX5780

## 2022-01-07 HISTORY — PX: BIOPSY: SHX5522

## 2022-01-07 LAB — GLUCOSE, CAPILLARY: Glucose-Capillary: 139 mg/dL — ABNORMAL HIGH (ref 70–99)

## 2022-01-07 SURGERY — COLONOSCOPY WITH PROPOFOL
Anesthesia: General

## 2022-01-07 MED ORDER — PROPOFOL 10 MG/ML IV BOLUS
INTRAVENOUS | Status: DC | PRN
  Administered 2022-01-07 (×2): 100 mg via INTRAVENOUS

## 2022-01-07 MED ORDER — LACTATED RINGERS IV SOLN: INTRAVENOUS | Status: DC

## 2022-01-07 MED ORDER — STERILE WATER FOR IRRIGATION IR SOLN
Status: DC | PRN
  Administered 2022-01-07: .6 mL

## 2022-01-07 MED ORDER — KETAMINE HCL 10 MG/ML IJ SOLN
INTRAMUSCULAR | Status: DC | PRN
  Administered 2022-01-07: 20 mg via INTRAVENOUS
  Administered 2022-01-07: 30 mg via INTRAVENOUS

## 2022-01-07 MED ORDER — PROPOFOL 500 MG/50ML IV EMUL
INTRAVENOUS | Status: DC | PRN
  Administered 2022-01-07: 150 ug/kg/min via INTRAVENOUS

## 2022-01-07 MED ORDER — KETAMINE HCL 50 MG/5ML IJ SOSY
PREFILLED_SYRINGE | INTRAMUSCULAR | Status: AC
  Filled 2022-01-07: qty 5

## 2022-01-07 NOTE — Progress Notes (Signed)
Pt social worker,  [place], discharged with pt and made awrae that pt needs 24hr supervision after anesthesia.

## 2022-01-07 NOTE — Op Note (Signed)
[facility] Patient Name: Melvin Ray Procedure Date: 01/07/2022 9:45 AM MRN: [id number] Date of Birth: [birth year] Attending MD: Katrinka Blazing , , [id number] CSN: [id number] Age: 36 Admit Type: Outpatient Procedure:                Upper GI endoscopy Indications:              Iron deficiency anemia Providers:                Katrinka Blazing, Jannett Celestine, RN, Pandora Leiter,                            Technician Referring MD:              Medicines:                Monitored Anesthesia Care Complications:            No immediate complications. Estimated Blood Loss:     Estimated blood loss: none. Procedure:                Pre-Anesthesia Assessment:                           - Prior to the procedure, a History and Physical                            was performed, and patient medications, allergies                            and sensitivities were reviewed. The patient's                            tolerance of previous anesthesia was reviewed.                           - The risks and benefits of the procedure and the                            sedation options and risks were discussed with the                            patient. All questions were answered and informed                            consent was obtained.                           - ASA Grade Assessment: III - A patient with severe                            systemic disease.                           After obtaining informed consent, the endoscope was                            passed under direct vision. Throughout the  procedure, the patient's blood pressure, pulse, and                            oxygen saturations were monitored continuously. The                            [id number]-H190 ([id number]) scope was introduced through the                            mouth, and advanced to the second part of duodenum.                            The upper GI endoscopy was accomplished without                             difficulty. The patient tolerated the procedure                            well. Findings:      The Z-line was regular and was found 42 cm from the incisors.      The gastroesophageal flap valve was visualized endoscopically and       classified as Hill Grade III (minimal fold, loose to endoscope, hiatal       hernia likely).      Two medium sessile polyps with no bleeding were found in the gastric       antrum. This was biopsied with a cold forceps for histology.      The examined duodenum was normal. Biopsies were taken with a cold       forceps for histology. Impression:               - Z-line regular, 42 cm from the incisors.                           - Gastroesophageal flap valve classified as Hill                            Grade III (minimal fold, loose to endoscope, hiatal                            hernia likely).                           - Two gastric polyps. Biopsied.                           - Normal examined duodenum. Biopsied. Moderate Sedation:      Per Anesthesia Care Recommendation:           - Discharge patient to home (ambulatory).                           - Resume previous diet.                           - Await pathology results. Procedure Code(s):        --- Professional ---  92330, Esophagogastroduodenoscopy, flexible,                            transoral; with biopsy, single or multiple Diagnosis Code(s):        --- Professional ---                           K31.7, Polyp of stomach and duodenum                           D50.9, Iron deficiency anemia, unspecified CPT copyright 2022 American Medical Association. All rights reserved. The codes documented in this report are preliminary and upon coder review may  be revised to meet current compliance requirements. Katrinka Blazing, MD Katrinka Blazing,  01/07/2022 10:07:03 AM This report has been signed electronically. Number of Addenda: 0

## 2022-01-07 NOTE — Op Note (Signed)
[facility] Patient Name: Melvin Ray Procedure Date: 01/07/2022 9:35 AM MRN: [id number] Date of Birth: 02-Nov-1985 Attending MD: Katrinka Blazing , , [id number] CSN: [id number] Age: 36 Admit Type: Outpatient Procedure:                Colonoscopy Indications:              Iron deficiency anemia Providers:                Katrinka Blazing, Jannett Celestine, RN, Pandora Leiter,                            Technician Referring MD:              Medicines:                Monitored Anesthesia Care Complications:            No immediate complications. Estimated Blood Loss:     Estimated blood loss: none. Procedure:                Pre-Anesthesia Assessment:                           - Prior to the procedure, a History and Physical                            was performed, and patient medications, allergies                            and sensitivities were reviewed. The patient's                            tolerance of previous anesthesia was reviewed.                           - The risks and benefits of the procedure and the                            sedation options and risks were discussed with the                            patient. All questions were answered and informed                            consent was obtained.                           - ASA Grade Assessment: III - A patient with severe                            systemic disease.                           After obtaining informed consent, the colonoscope                            was passed under direct vision. Throughout the  procedure, the patient's blood pressure, pulse, and                            oxygen saturations were monitored continuously. The                            [id number]-HQ190L ([id number]) scope was introduced through                            the anus and advanced to the the cecum, identified                            by appendiceal orifice and ileocecal valve. The                             colonoscopy was performed without difficulty. The                            patient tolerated the procedure well. The quality                            of the bowel preparation was adequate to identify                            polyps greater than 5 mm in size. Scope In: 10:10:54 AM Scope Out: 10:27:28 AM Scope Withdrawal Time: 0 hours 13 minutes 56 seconds  Total Procedure Duration: 0 hours 16 minutes 34 seconds  Findings:      The perianal and digital rectal examinations were normal.      A few medium-mouthed diverticula were found in the sigmoid colon.      Non-bleeding internal hemorrhoids were found during retroflexion. The       hemorrhoids were small. Impression:               - Diverticulosis in the sigmoid colon.                           - Non-bleeding internal hemorrhoids.                           - No specimens collected. Moderate Sedation:      Per Anesthesia Care Recommendation:           - Discharge patient to home (ambulatory).                           - Resume previous diet.                           - Repeat colonoscopy in 10 years for screening                            purposes. Procedure Code(s):        --- Professional ---                           [phone], Colonoscopy, flexible;  diagnostic, including                            collection of specimen(s) by brushing or washing,                            when performed (separate procedure) Diagnosis Code(s):        --- Professional ---                           K64.8, Other hemorrhoids                           D50.9, Iron deficiency anemia, unspecified                           K57.30, Diverticulosis of large intestine without                            perforation or abscess without bleeding CPT copyright 2022 [employer]. All rights reserved. The codes documented in this report are preliminary and upon coder review may  be revised to meet current compliance requirements. Katrinka Blazing, MD Katrinka Blazing,  01/07/2022 10:38:35 AM This report has been signed electronically. Number of Addenda: 0

## 2022-01-07 NOTE — Transfer of Care (Signed)
Immediate Anesthesia Transfer of Care Note  Patient: Shadi Sessler  Procedure(s) Performed: COLONOSCOPY WITH PROPOFOL ESOPHAGOGASTRODUODENOSCOPY (EGD) BIOPSY  Patient Location: Short Stay  Anesthesia Type:General  Level of Consciousness: awake, alert , oriented, and patient cooperative  Airway & Oxygen Therapy: Patient Spontanous Breathing  Post-op Assessment: Report given to RN, Post -op Vital signs reviewed and stable, and Patient moving all extremities X 4  Post vital signs: Reviewed and stable  Last Vitals:  Vitals Value Taken Time  BP 140/105 01/07/22 1036  Temp 36.6 C 01/07/22 1034  Pulse 70 01/07/22 1034  Resp 20 01/07/22 1034  SpO2 95 % 01/07/22 1036    Last Pain:  Vitals:   01/07/22 1034  TempSrc: Oral  PainSc: Asleep      Patients Stated Pain Goal: 6 (01/07/22 0836)  Complications: No notable events documented.

## 2022-01-07 NOTE — Discharge Instructions (Addendum)
You are being discharged to home.  Resume your previous diet.  Your physician has recommended a repeat colonoscopy in 10 years for screening purposes.  Continue your present medications.

## 2022-01-07 NOTE — Anesthesia Preprocedure Evaluation (Signed)
Anesthesia Evaluation  Patient identified by MRN, date of birth, ID band Patient awake    Reviewed: Allergy & Precautions, H&P , NPO status , Patient's Chart, lab work & pertinent test results, reviewed documented beta blocker date and time   Airway Mallampati: III  TM Distance: >3 FB Neck ROM: full    Dental no notable dental hx.    Pulmonary sleep apnea , Current Smoker and Patient abstained from smoking.    (-) decreased breath sounds      Cardiovascular Exercise Tolerance: Good hypertension, +CHF   Rhythm:regular Rate:Normal     Neuro/Psych  PSYCHIATRIC DISORDERS  Depression    negative neurological ROS     GI/Hepatic negative GI ROS, Neg liver ROS,,,  Endo/Other  diabetes  Morbid obesity  Renal/GU CRFRenal disease (CKD Stage II)  negative genitourinary   Musculoskeletal   Abdominal   Peds  Hematology  (+) Blood dyscrasia, anemia   Anesthesia Other Findings BMI 59.71 Poor historian, unsure of what medications he takes and why he takes them  Reproductive/Obstetrics negative OB ROS                             Anesthesia Physical Anesthesia Plan  ASA: 4  Anesthesia Plan: General   Post-op Pain Management:    Induction:   PONV Risk Score and Plan: TIVA  Airway Management Planned:   Additional Equipment:   Intra-op Plan:   Post-operative Plan:   Informed Consent: I have reviewed the patients History and Physical, chart, labs and discussed the procedure including the risks, benefits and alternatives for the proposed anesthesia with the patient or authorized representative who has indicated his/her understanding and acceptance.     Dental Advisory Given  Plan Discussed with: CRNA  Anesthesia Plan Comments:         Anesthesia Quick Evaluation

## 2022-01-07 NOTE — Anesthesia Postprocedure Evaluation (Signed)
Anesthesia Post Note  Patient: Melvin Ray  Procedure(s) Performed: COLONOSCOPY WITH PROPOFOL ESOPHAGOGASTRODUODENOSCOPY (EGD) BIOPSY  Patient location during evaluation: Phase II Anesthesia Type: General Level of consciousness: awake and alert Pain management: pain level controlled Vital Signs Assessment: post-procedure vital signs reviewed and stable Respiratory status: spontaneous breathing, nonlabored ventilation, respiratory function stable and patient connected to nasal cannula oxygen Cardiovascular status: blood pressure returned to baseline and stable Postop Assessment: no apparent nausea or vomiting Anesthetic complications: no   No notable events documented.   Last Vitals:  Vitals:   01/07/22 1034 01/07/22 1036  BP: (!) 156/100 (!) 140/105  Pulse: 70   Resp: 20   Temp: 36.6 C   SpO2: (!) 88% 95%    Last Pain:  Vitals:   01/07/22 1034  TempSrc: Oral  PainSc: Asleep                 Zuleima Haser Carolynn Serve

## 2022-01-07 NOTE — H&P (Signed)
Melvin Ray is an 36 y.o. male.   Chief Complaint: iron deficiency anemia HPI: 36 year old male with past medical history DM, HTN, morbid obesity, Sleep apnea, CKD stage 3a, coming for evaluation of iron deficiency anemia.  The patient denies having any nausea, vomiting, fever, chills, hematochezia, melena, hematemesis, abdominal distention, abdominal pain, diarrhea, jaundice, pruritus or weight loss.  Past Medical History:  Diagnosis Date   Diabetes mellitus without complication (HCC)    Hypertension    Morbid obesity (HCC)    Obstructive sleep apnea    Onychomycosis    Tobacco abuse     Past Surgical History:  Procedure Laterality Date   APPENDECTOMY      Family History  Problem Relation Age of Onset   Heart failure Father    Hypertension Father    Diabetes Maternal Grandmother    Heart failure Paternal Grandmother        transplant   Social History:  reports that he has been smoking cigars. He has been exposed to tobacco smoke. He has quit using smokeless tobacco. He reports that he does not drink alcohol and does not use drugs.  Allergies: No Known Allergies  Medications Prior to Admission  Medication Sig Dispense Refill   chlorthalidone (HYGROTON) 25 MG tablet TAKE 1 TABLET ONCE DAILY. 90 tablet 3   Cholecalciferol (VITAMIN D3) 50 MCG (2000 UT) TABS Take 2,000 Units by mouth daily.     cloNIDine (CATAPRES) 0.3 MG tablet Take 0.3 mg by mouth 2 (two) times daily.     diltiazem (CARDIZEM CD) 300 MG 24 hr capsule Take 300 mg by mouth daily.     FEROSUL 325 (65 Fe) MG tablet Take 325 mg by mouth daily.     FLUoxetine (PROZAC) 20 MG capsule Take 20 mg by mouth daily.     furosemide (LASIX) 20 MG tablet TAKE 1 TABLET ONCE DAILY. 7 tablet 0   hydrALAZINE (APRESOLINE) 100 MG tablet Take 100 mg by mouth 3 (three) times daily.     losartan (COZAAR) 100 MG tablet Take 100 mg by mouth at bedtime.     metFORMIN (GLUCOPHAGE-XR) 500 MG 24 hr tablet Take 1,000 mg by mouth 2 (two) times  daily with breakfast and lunch.     metoprolol (TOPROL-XL) 200 MG 24 hr tablet Take 200 mg by mouth daily.     pantoprazole (PROTONIX) 40 MG tablet Take 40 mg by mouth daily.     polyethylene glycol-electrolytes (TRILYTE) 420 g solution Take 4,000 mLs by mouth as directed. 4000 mL 0   potassium chloride (KLOR-CON) 10 MEQ tablet Take 10 mEq by mouth daily.     potassium chloride SA (KLOR-CON M) 20 MEQ tablet Take 2 tablets (40 mEq total) by mouth daily for 4 days. 8 tablet 0   traZODone (DESYREL) 100 MG tablet Take 200 mg by mouth at bedtime.      ACCU-CHEK GUIDE test strip 3 (three) times daily.     Accu-Chek Softclix Lancets lancets 3 (three) times daily.     albuterol (VENTOLIN HFA) 108 (90 Base) MCG/ACT inhaler Inhale 1-2 puffs into the lungs every 6 (six) hours as needed for wheezing or shortness of breath.     Fluticasone-Salmeterol (ADVAIR) 250-50 MCG/DOSE AEPB Inhale 1 puff into the lungs daily.     glipiZIDE (GLUCOTROL XL) 10 MG 24 hr tablet TAKE 1 TABLET ONCE DAILY WITH BREAKFAST. 30 tablet 0   insulin glargine (LANTUS SOLOSTAR) 100 UNIT/ML Solostar Pen Inject 80 Units into the skin at bedtime.  30 mL 2   NON FORMULARY Pt uses a cpap nightly      No results found for this or any previous visit (from the past 48 hour(s)). No results found.  Review of Systems  All other systems reviewed and are negative.   Blood pressure (!) 158/96, pulse (!) 58, temperature (!) 97.5 F (36.4 C), temperature source Oral, resp. rate 15, SpO2 99 %. Physical Exam  GENERAL: The patient is AO x3, in no acute distress. Obese. HEENT: Head is normocephalic and atraumatic. EOMI are intact. Mouth is well hydrated and without lesions. NECK: Supple. No masses LUNGS: Clear to auscultation. No presence of rhonchi/wheezing/rales. Adequate chest expansion HEART: RRR, normal s1 and s2. ABDOMEN: Soft, nontender, no guarding, no peritoneal signs, and nondistended. BS +. No masses. EXTREMITIES: Without any  cyanosis, clubbing, rash, lesions or edema. NEUROLOGIC: AOx3, no focal motor deficit. SKIN: no jaundice, no rashes  Assessment/Plan  36 year old male with past medical history DM, HTN, morbid obesity, Sleep apnea, CKD stage 3a, coming for evaluation of iron deficiency anemia.  We will proceed with EGD and colonoscopy.  Dolores Frame, MD 01/07/2022, 8:56 AM

## 2022-01-10 LAB — SURGICAL PATHOLOGY

## 2022-01-13 ENCOUNTER — Encounter (INDEPENDENT_AMBULATORY_CARE_PROVIDER_SITE_OTHER): Payer: Self-pay | Admitting: *Deleted

## 2022-01-13 ENCOUNTER — Encounter (HOSPITAL_COMMUNITY): Payer: Self-pay | Admitting: Gastroenterology

## 2022-01-14 ENCOUNTER — Telehealth (INDEPENDENT_AMBULATORY_CARE_PROVIDER_SITE_OTHER): Payer: Self-pay | Admitting: *Deleted

## 2022-01-14 NOTE — Telephone Encounter (Signed)
Patient returned call and I gave him results of path per Dr. Levon Hedger - Pathology showed normal small bowel and gastric hyperplastic polyps. Needs repeat EGD in 1 year. Also once he reaches [place] we will discuss proceeding with capsule endoscopy.  Patient verbalized understanding and did want to proceed with capsule endoscopy. He said to call social worker Ether Griffins at [phone] to schedule appointment.

## 2022-01-14 NOTE — Telephone Encounter (Signed)
Thanks. Mindy please schedule capsule endoscopy Dx: iron deficiency anemia

## 2022-01-15 NOTE — Telephone Encounter (Signed)
Spoke with social worker Ether Griffins. Scheduled for 12/13 at 8:30am. I will fax pt instructions to him at [phone]

## 2022-01-15 NOTE — Telephone Encounter (Signed)
PA submitted via [facility] fax.

## 2022-01-21 NOTE — Telephone Encounter (Signed)
PA approved. Auth# [id number], DOS: 02/05/22-04/06/22

## 2022-02-05 ENCOUNTER — Encounter (HOSPITAL_COMMUNITY)
Admission: RE | Disposition: A | Discharge status: Home / Self Care | Payer: Self-pay | Source: Home / Self Care | Attending: Gastroenterology

## 2022-02-05 ENCOUNTER — Ambulatory Visit (HOSPITAL_COMMUNITY)
Admission: RE | Admit: 2022-02-05 | Discharge: 2022-02-05 | Disposition: A | Discharge status: Home / Self Care | Payer: Medicaid Other | Attending: Gastroenterology | Admitting: Gastroenterology

## 2022-02-05 DIAGNOSIS — K3184 Gastroparesis: Secondary | ICD-10-CM | POA: Diagnosis not present

## 2022-02-05 DIAGNOSIS — D509 Iron deficiency anemia, unspecified: Secondary | ICD-10-CM | POA: Insufficient documentation

## 2022-02-05 DIAGNOSIS — Z9889 Other specified postprocedural states: Secondary | ICD-10-CM

## 2022-02-05 HISTORY — PX: GIVENS CAPSULE STUDY: SHX5432

## 2022-02-05 SURGERY — IMAGING PROCEDURE, GI TRACT, INTRALUMINAL, VIA CAPSULE

## 2022-02-06 ENCOUNTER — Encounter (HOSPITAL_COMMUNITY): Payer: Self-pay | Admitting: Gastroenterology

## 2022-02-11 ENCOUNTER — Other Ambulatory Visit: Payer: Self-pay | Admitting: "Endocrinology

## 2022-02-11 NOTE — Procedures (Signed)
Small Bowel Givens Capsule Study Procedure date: 02/05/22  Referring Provider:  PCP PCP:  Dr. Waldon Reining, MD  Indication for procedure:  IDA  Findings:  capsule reached the cecum, however bowel preparation was fair in the small bowel - there was significant amount of intraluminal food. No large gross abnormalities were found in the gastrointestinal lining. Delayed gastric emptying  First Gastric image:  00:00:50 First Duodenal image: 04:56:53 First Cecal image: 06:50:29 Gastric Passage time: 4h 12m Small Bowel Passage time:  1h 57m      Summary & Recommendations: Delayed gastric emptying No large gross abnormalities were found in the small bowel lining. However, this was limited by fair preparation in the small bowel.  We will monitor CBC and iron stores in clinic for now.  If worsening anemia or iron stores, will repeat capsule endoscopy with endoscopic deployment - he will also need to be on liquid diet for 3 days.  I personally communicated these recommendations to the patient  Katrinka Blazing, MD Gastroenterology and Hepatology [facility]

## 2022-02-25 ENCOUNTER — Ambulatory Visit: Payer: Medicaid Other | Admitting: Podiatry

## 2022-02-26 ENCOUNTER — Encounter: Payer: Self-pay | Admitting: Physician Assistant

## 2022-02-26 NOTE — Progress Notes (Unsigned)
Cardiology Office Note    Date:  02/27/2022   ID:  Melvin Ray, DOB [birth year], MRN [id number]  PCP:  Waldon Reining, MD  Cardiologist:  Dina Rich, MD  Electrophysiologist:  None   Chief Complaint: overdue f/u HTN  History of Present Illness:   Melvin Ray is a 37 y.o. male with history of HTN, severe morbid obesity with OSA, chronic leg edema, SVT (previously in setting of DKA and hypokalemia in 2020), DM, tobacco abuse, CKD stage 2, IDA who is seen for follow-up.   He has a history of resistant HTN. Per notes, had prior renal artery duplex 2011 that was benign. He has followed in the past with the advanced HTN clinic with Dr. Duke Salvia. He was also following with health coaching through our office for a while but in 2022 phone calls went unanswered. His last OV was in 10/2020 with Dr. Wyline Mood.  Renin/aldo level was suggested but do not see obtained. 2D echo 12/2019 EF 60-65%, mild LVH, mildly dilated LV, normal RV, trivial MR, dilated IVC. U/LGI eval 12/2021 for IDA showed diverticulosis, nonbleeding hemorrhoids, hyperplastic gastric polyp.   He is seen for follow-up today with social worker Vilinda Blanks who identifies himself as the main point of contact now which patient is agreeable to. He reports overall he's been feeling well without any recent CP or new SOB. His edema remains at baseline per his report - "my feet can fit in my shoes which is good." His weight has increased since he stopped with the lifestyle modifications with the health coach. He stopped going to the [facility] about 2 weeks ago. He reports compliance to medicines. Per review with Bethann Berkshire, he is no longer on Lasix (last rx'd 08/2021 for short course only). Reports adherence with medications. He does not know how to check the messages on his phone per his report to nurse.   Labwork independently reviewed: 12/2021 K 3.2, Cr 1.11, Hgb 11.7, Plt 209 06/2021 TSH 4.81, A1C 10.2, LDL 95, trig 264, albumin 3.9, Cr 1.2, K 3.5 2021  BNP wnl  Cardiology Studies:   Studies reviewed are outlined and summarized above. Reports included below if pertinent.   Echo 2021   1. Left ventricular ejection fraction, by estimation, is 60 to 65%. The  left ventricle has normal function. The left ventricle has no regional  wall motion abnormalities. The left ventricular internal cavity size was  mildly dilated. There is mild left  ventricular hypertrophy. Left ventricular diastolic parameters are  indeterminate.   2. Right ventricular systolic function is normal. The right ventricular  size is normal. Tricuspid regurgitation signal is inadequate for assessing  PA pressure.   3. The mitral valve is grossly normal. Trivial mitral valve  regurgitation.   4. The aortic valve is tricuspid. Aortic valve regurgitation is not  visualized.   5. The inferior vena cava is dilated in size with >50% respiratory  variability, suggesting right atrial pressure of 8 mmHg.     Past Medical History:  Diagnosis Date   Diabetes mellitus without complication (HCC)    Hypertension    Morbid obesity (HCC)    Obstructive sleep apnea    Onychomycosis    OSA (obstructive sleep apnea)    SVT (supraventricular tachycardia)    Tobacco abuse     Past Surgical History:  Procedure Laterality Date   APPENDECTOMY     BIOPSY  01/07/2022   Procedure: BIOPSY;  Surgeon: Dolores Frame, MD;  [facility]: [facility];  Service:  Gastroenterology;;   COLONOSCOPY WITH PROPOFOL N/A 01/07/2022   Procedure: COLONOSCOPY WITH PROPOFOL;  Surgeon: Dolores Frame, MD;  [facility]: [facility];  Service: Gastroenterology;  Laterality: N/A;  1030 ASA 3   ESOPHAGOGASTRODUODENOSCOPY  01/07/2022   Procedure: ESOPHAGOGASTRODUODENOSCOPY (EGD);  Surgeon: Marguerita Merles, Reuel Boom, MD;  Location: [facility];  Service: Gastroenterology;;   GIVENS CAPSULE STUDY N/A 02/05/2022   Procedure: GIVENS CAPSULE STUDY;  Surgeon: Dolores Frame, MD;   [facility]: [facility];  Service: Gastroenterology;  Laterality: N/A;  8:30am    Current Medications: Current Meds  Medication Sig   ACCU-CHEK GUIDE test strip 3 (three) times daily.   Accu-Chek Softclix Lancets lancets 3 (three) times daily.   albuterol (VENTOLIN HFA) 108 (90 Base) MCG/ACT inhaler Inhale 1-2 puffs into the lungs every 6 (six) hours as needed for wheezing or shortness of breath.   chlorthalidone (HYGROTON) 25 MG tablet TAKE 1 TABLET ONCE DAILY.   Cholecalciferol (VITAMIN D3) 50 MCG (2000 UT) TABS Take 2,000 Units by mouth daily.   cloNIDine (CATAPRES) 0.3 MG tablet Take 0.3 mg by mouth 2 (two) times daily.   diltiazem (CARDIZEM CD) 300 MG 24 hr capsule Take 300 mg by mouth daily.   FEROSUL 325 (65 Fe) MG tablet Take 325 mg by mouth daily.   FLUoxetine (PROZAC) 20 MG capsule Take 20 mg by mouth daily.   Fluticasone-Salmeterol (ADVAIR) 250-50 MCG/DOSE AEPB Inhale 1 puff into the lungs daily.   furosemide (LASIX) 20 MG tablet TAKE 1 TABLET ONCE DAILY.   glipiZIDE (GLUCOTROL XL) 10 MG 24 hr tablet TAKE 1 TABLET ONCE DAILY WITH BREAKFAST.   hydrALAZINE (APRESOLINE) 100 MG tablet Take 100 mg by mouth 3 (three) times daily.   insulin glargine (LANTUS SOLOSTAR) 100 UNIT/ML Solostar Pen Inject 80 Units into the skin at bedtime.   losartan (COZAAR) 100 MG tablet Take 100 mg by mouth at bedtime.   metFORMIN (GLUCOPHAGE-XR) 500 MG 24 hr tablet Take 1,000 mg by mouth 2 (two) times daily with breakfast and lunch.   metoprolol (TOPROL-XL) 200 MG 24 hr tablet Take 200 mg by mouth daily.   NON FORMULARY Pt uses a cpap nightly   pantoprazole (PROTONIX) 40 MG tablet Take 40 mg by mouth daily.   potassium chloride (KLOR-CON) 10 MEQ tablet Take 10 mEq by mouth daily.   traZODone (DESYREL) 100 MG tablet Take 200 mg by mouth at bedtime.       Allergies:   Patient has no known allergies.   Social History   Socioeconomic History   Marital status: Single    Spouse name: Not on file    Number of children: Not on file   Years of education: Not on file   Highest education level: Not on file  Occupational History   Not on file  Tobacco Use   Smoking status: Some Days    Types: Cigars    Passive exposure: Current   Smokeless tobacco: Former  Building services engineer Use: Every day  Substance and Sexual Activity   Alcohol use: Never   Drug use: Never   Sexual activity: Not on file  Other Topics Concern   Not on file  Social History Narrative   Not on file   Social Determinants of Health   Financial Resource Strain: Low Risk  (03/22/2020)   Overall Financial Resource Strain (CARDIA)    Difficulty of Paying Living Expenses: Not very hard  Food Insecurity: No Food Insecurity (03/22/2020)   Hunger  Vital Sign    Worried About Charity fundraiser in the Last Year: Never true    Ran Out of Food in the Last Year: Never true  Transportation Needs: Unmet Transportation Needs (03/22/2020)   PRAPARE - Hydrologist (Medical): Yes    Lack of Transportation (Non-Medical): Yes  Physical Activity: Insufficiently Active (07/31/2020)   Exercise Vital Sign    Days of Exercise per Week: 3 days    Minutes of Exercise per Session: 40 min  Stress: Not on file  Social Connections: Not on file     Family History:  The patient's family history includes Diabetes in his maternal grandmother; Heart failure in his father and paternal grandmother; Hypertension in his father.  ROS:   Please see the history of present illness.  All other systems are reviewed and otherwise negative.    EKG(s)/Additional Labs   EKG:  EKG is ordered today, personally reviewed, demonstrating NSR 76bpm with mild sinus arrhythmia, nonspecific TW changes, Qtc 428ms. Unchanged from prior.  Recent Labs: 05/09/2021: ALT 19 07/23/2021: TSH 4.81 01/02/2022: BUN 16; Creatinine, Ser 1.11; Hemoglobin 11.7; Platelets 209; Potassium 3.2; Sodium 141  Recent Lipid Panel    Component Value  Date/Time   CHOL 169 07/23/2021 0000   TRIG 264 (A) 07/23/2021 0000   HDL 36 07/23/2021 0000   LDLCALC 95 07/23/2021 0000    PHYSICAL EXAM:    VS:  BP (!) 147/88   Pulse 76   Ht 6' (1.829 m)   Wt (!) 457 lb (207.3 kg)   SpO2 98%   BMI 61.98 kg/m   BMI: Body mass index is 61.98 kg/m.  GEN: Obese AA male in no acute distress HEENT: normocephalic, atraumatic Neck: no JVD, carotid bruits, or masses Cardiac: RRR; no murmurs, rubs, or gallops, no edema  Respiratory:  clear to auscultation bilaterally, normal work of breathing GI: soft, nontender, nondistended, + BS MS: no deformity or atrophy Skin: warm and dry, no rash Neuro:  Alert and Oriented x 3, Strength and sensation are intact, follows commands Psych: euthymic mood, full affect  Wt Readings from Last 3 Encounters:  02/27/22 (!) 457 lb (207.3 kg)  02/05/22 (!) 440 lb (199.6 kg)  01/02/22 (!) 440 lb 4.1 oz (199.7 kg)     ASSESSMENT & PLAN:   1. Severe HTN - BP remains suboptimally controlled despite multiple antihypertensives - chlorthalidone 25mg  daily, clonidine 0.3mg  BID, diltiazem 300mg  daily, hydralazine 100mg  TID, losartan 100mg  daily, Toprol 200mg  daily. Dr. Harl Bowie previously suggested consideration of spironolactone after renin/aldosterone level - will check along with BMET/Mg given hypokalemia to guide dosing. He would really benefit from getting plugged back in with Advanced HTN clinic - will refer back to Dr. Oval Linsey to help follow. I also think his severe morbid obesity is playing a significant role. Will refer to pharmD to see if he is a candidate for GLP-1 as he also has DM. We also gave him the # of the health coach as he'd like to get back on board with them.  HYPERTENSION CONTROL Vitals:   02/27/22 1535 02/27/22 1630  BP: (!) 147/88 (!) 148/88    The patient's blood pressure is elevated above target today.  In order to address the patient's elevated BP: Labs and/or other diagnostics are currently  pending prior to making blood pressure medication adjustments.     2. Severe morbid obesity with OSA - adherent to CPAP. Has sleep f/u with pulm planned since mask is  36 years old. Refer to pharmD as above for consideration of GLP-1. We discussed that his morbid obesity is approaching life threatening at this point with risk of worsening heart failure, stroke, O2 dependency and so forth.  3. Chronic edema - patient reports at baseline. Weight gain noted but this has been uptrending over most of the last 2 years in gradual progression. Volume assessment difficult. Will check BMET above but would not restart Lasix yet until K is normal.  4. SVT - quiescent on high dose Toprol, diltiazem, and clonidine.     Disposition: F/u with Dr. Oval Linsey with HTN clinic next visit and pharmD as above.   Medication Adjustments/Labs and Tests Ordered: Current medicines are reviewed at length with the patient today.  Concerns regarding medicines are outlined above. Medication changes, Labs and Tests ordered today are summarized above and listed in the Patient Instructions accessible in Encounters.    Signed, Charlie Pitter, PA-C  02/27/2022 3:57 PM    [facility] Location in [place] [place], [place] [address] Ph: [phone]; Fax [phone]

## 2022-02-27 ENCOUNTER — Encounter: Payer: Self-pay | Admitting: Physician Assistant

## 2022-02-27 ENCOUNTER — Ambulatory Visit: Payer: Medicaid Other | Attending: Physician Assistant | Admitting: Physician Assistant

## 2022-02-27 VITALS — BP 148/88 | HR 76 | Ht 72.0 in | Wt >= 6400 oz

## 2022-02-27 DIAGNOSIS — I471 Supraventricular tachycardia, unspecified: Secondary | ICD-10-CM | POA: Diagnosis not present

## 2022-02-27 DIAGNOSIS — I10 Essential (primary) hypertension: Secondary | ICD-10-CM

## 2022-02-27 DIAGNOSIS — G4733 Obstructive sleep apnea (adult) (pediatric): Secondary | ICD-10-CM | POA: Diagnosis not present

## 2022-02-27 DIAGNOSIS — R609 Edema, unspecified: Secondary | ICD-10-CM

## 2022-02-27 DIAGNOSIS — Z719 Counseling, unspecified: Secondary | ICD-10-CM

## 2022-02-27 NOTE — Patient Instructions (Addendum)
Medication Instructions:  Your physician recommends that you continue on your current medications as directed. Please refer to the Current Medication list given to you today.   Labwork: BMET Mag Renin/Aldosterone Ratio  Testing/Procedures: None  Follow-Up: Follow up with Dr. Oval Linsey- Hypertension Clinic  Any Other Special Instructions Will Be Listed Below (If Applicable).  AMY Colgan- Health [phone]    You have been referred to the PharmD group. They will call you with your first appointment information.   If you need a refill on your cardiac medications before your next appointment, please call your pharmacy.

## 2022-03-04 ENCOUNTER — Institutional Professional Consult (permissible substitution): Payer: Medicaid Other | Admitting: Pulmonary Disease

## 2022-03-17 ENCOUNTER — Encounter: Payer: Self-pay | Admitting: Adult Health

## 2022-03-17 ENCOUNTER — Ambulatory Visit (INDEPENDENT_AMBULATORY_CARE_PROVIDER_SITE_OTHER): Payer: Medicaid Other | Admitting: Adult Health

## 2022-03-17 VITALS — BP 146/82 | HR 67 | Temp 98.4°F | Ht 72.0 in | Wt >= 6400 oz

## 2022-03-17 DIAGNOSIS — G4733 Obstructive sleep apnea (adult) (pediatric): Secondary | ICD-10-CM | POA: Diagnosis not present

## 2022-03-17 NOTE — Patient Instructions (Signed)
BIPAP download.  Order for new BIPAP  Wear BIPAP all night long .  Work on healthy weight loss.  Follow up in 3 months and As needed

## 2022-03-17 NOTE — Assessment & Plan Note (Signed)
History of severe obstructive sleep apnea.  Repeat sleep study 2022 showed ongoing severe sleep apnea with AHI at 36.4/hour.  Patient has been maintained on BiPAP.  Patient says he cannot sleep without it and his machine is got old.  Order for new BiPAP has been sent.  Will continue with same BiPAP settings with auto BiPAP IPAP max at 22 cm H2O.  And EPAP minimum 18 cm H2O. Will have a follow-up visit with download.  Pending these results can decide if titration study is indicated. Concern for possible associated obesity hypoventilation syndrome with his BMI being at 60.     Patient education was given. - discussed how weight can impact sleep and risk for sleep disordered breathing - discussed options to assist with weight loss: combination of diet modification, cardiovascular and strength training exercises   - had an extensive discussion regarding the adverse health consequences related to untreated sleep disordered breathing - specifically discussed the risks for hypertension, coronary artery disease, cardiac dysrhythmias, cerebrovascular disease, and diabetes - lifestyle modification discussed   - discussed how sleep disruption can increase risk of accidents, particularly when driving - safe driving practices were discussed   Plan  Patient Instructions  BIPAP download.  Order for new BIPAP  Wear BIPAP all night long .  Work on healthy weight loss.  Follow up in 3 months and As needed

## 2022-03-17 NOTE — Assessment & Plan Note (Signed)
Healthy weight loss discussed.  

## 2022-03-17 NOTE — Progress Notes (Signed)
@Patient  ID: Melvin Ray, male    DOB: [birth year], 37 y.o.   MRN: [id number]  Chief Complaint  Patient presents with   Consult    Referring provider: Liana Gerold, MD  HPI: 37 year old male seen for sleep consult March 17, 2022 to establish for sleep apnea  TEST/EVENTS :  03/2020 NPSG AHI 36.4/hr , supine AHI 76/hr , spo2 79%  Echo November 2021 showed EF at 60 to 65%, mild LVH, right ventricular systolic function normal.  RV size is normal.  03/17/2022 Sleep consult  Patient presents today for a sleep consult.  Kindly referred by his primary care provider Dr. Theador Hawthorne.  Patient says he has been diagnosed with sleep apnea for greater than 10 years.  He has been on BiPAP for very long time.  Patient says his BiPAP machine is getting old.  He also needs new supplies as well.  Patient says he cannot sleep without a BiPAP.  He feels that he benefits from BiPAP and has to have it to even sleep at all.  Patient did have a follow-up sleep study that was done in February 2022 that showed ongoing severe sleep apnea with AHI at 36.4/hour and a SpO2 low at 79%.  Supine AHI was 76/hour.  The titration portion was not completed.  Patient uses [employer] [facility].  Uses a fullface mask.  Does take trazodone for sleep.  Patient says his sleep is very fragmented.  Typically goes to sleep in the early morning hours between 4 and 5 AM.  And will sleep until after lunchtime.  Patient says he is just considers himself an eye now.  Drinks about 1 cup of caffeine daily.  Patient is accompanied by social worker from [facility] that helps with his medical care.  Patient says he typically gets about 7 hours of sleep with his BiPAP.  Patient says he needs a new BiPAP and BiPAP supplies. Patient's BiPAP machine no longer does download.  However previous compliance showed 100% compliance patient is on auto BiPAP with IPAP max at 22 cm H2O.  EPAP minimum at 18 cm H2O.  AHI was 7.7.  Very strong mask leak. Patient says he  does nap some does fall asleep if he is watching TV. Current weight is at 446 pounds with a BMI at 60     03/17/2022   11:00 AM  Results of the Epworth flowsheet  Sitting and reading 3  Watching TV 3  Sitting, inactive in a public place (e.g. a theatre or a meeting) 2  As a passenger in a car for an hour without a break 2  Lying down to rest in the afternoon when circumstances permit 1  Sitting and talking to someone 0  Sitting quietly after a lunch without alcohol 0  In a car, while stopped for a few minutes in traffic 0  Total score 11   Social history patient lives by self.  He is disabled.  Has no children.  He is single.  Does try to go to the [facility] 2-3 times a week.  He does not drive.  [place] worker team helps him with his medical care. Smokes 7 cigars a week.  Does not drink alcohol.  No drug use.  Family history positive for diabetes and heart disease  Surgical history appendectomy  Past medical history significant for hypertension, asthma, diabetes, hyperlipidemia, chronic allergies, chronic headaches, morbid obesity with a BMI at 60   No Known Allergies  Immunization History  Administered  Date(s) Administered   Influenza Split 02/15/2019   Tdap 07/16/2015    Past Medical History:  Diagnosis Date   Diabetes mellitus without complication (HCC)    Hypertension    Morbid obesity (HCC)    Obstructive sleep apnea    Onychomycosis    OSA (obstructive sleep apnea)    SVT (supraventricular tachycardia)    Tobacco abuse     Tobacco History: Social History   Tobacco Use  Smoking Status Some Days   Types: Cigars   Passive exposure: Current  Smokeless Tobacco Former   Ready to quit: Not Answered Counseling given: Not Answered   Outpatient Medications Prior to Visit  Medication Sig Dispense Refill   ACCU-CHEK GUIDE test strip 3 (three) times daily.     Accu-Chek Softclix Lancets lancets 3 (three) times daily.     albuterol (VENTOLIN HFA)  108 (90 Base) MCG/ACT inhaler Inhale 1-2 puffs into the lungs every 6 (six) hours as needed for wheezing or shortness of breath.     chlorthalidone (HYGROTON) 25 MG tablet TAKE 1 TABLET ONCE DAILY. 90 tablet 3   Cholecalciferol (VITAMIN D3) 50 MCG (2000 UT) TABS Take 2,000 Units by mouth daily.     cloNIDine (CATAPRES) 0.3 MG tablet Take 0.3 mg by mouth 2 (two) times daily.     diltiazem (CARDIZEM CD) 300 MG 24 hr capsule Take 300 mg by mouth daily.     FEROSUL 325 (65 Fe) MG tablet Take 325 mg by mouth daily.     FLUoxetine (PROZAC) 20 MG capsule Take 20 mg by mouth daily.     Fluticasone-Salmeterol (ADVAIR) 250-50 MCG/DOSE AEPB Inhale 1 puff into the lungs daily.     glipiZIDE (GLUCOTROL XL) 10 MG 24 hr tablet TAKE 1 TABLET ONCE DAILY WITH BREAKFAST. 30 tablet 0   hydrALAZINE (APRESOLINE) 100 MG tablet Take 100 mg by mouth 3 (three) times daily.     insulin glargine (LANTUS SOLOSTAR) 100 UNIT/ML Solostar Pen Inject 80 Units into the skin at bedtime. 30 mL 2   losartan (COZAAR) 100 MG tablet Take 100 mg by mouth at bedtime.     metFORMIN (GLUCOPHAGE-XR) 500 MG 24 hr tablet Take 1,000 mg by mouth 2 (two) times daily with breakfast and lunch.     metoprolol (TOPROL-XL) 200 MG 24 hr tablet Take 200 mg by mouth daily.     pantoprazole (PROTONIX) 40 MG tablet Take 40 mg by mouth daily.     potassium chloride (KLOR-CON) 10 MEQ tablet Take 10 mEq by mouth daily.     traZODone (DESYREL) 100 MG tablet Take 200 mg by mouth at bedtime.      NON FORMULARY Pt uses a cpap nightly (Patient not taking: Reported on 03/17/2022)     No facility-administered medications prior to visit.     Review of Systems:   Constitutional:   No  weight loss, night sweats,  Fevers, chills, + fatigue, or  lassitude.  HEENT:   No headaches,  Difficulty swallowing,  Tooth/dental problems, or  Sore throat,                No sneezing, itching, ear ache, nasal congestion, post nasal drip,   CV:  No chest pain,  Orthopnea,  PND, +swelling in lower extremities, No anasarca, dizziness, palpitations, syncope.   GI  No heartburn, indigestion, abdominal pain, nausea, vomiting, diarrhea, change in bowel habits, loss of appetite, bloody stools.   Resp:   No excess mucus, no productive cough,  No non-productive  cough,  No coughing up of blood.  No change in color of mucus.  No wheezing.  No chest wall deformity  Skin: no rash or lesions.  GU: no dysuria, change in color of urine, no urgency or frequency.  No flank pain, no hematuria   MS:  No joint pain or swelling.  No decreased range of motion.  No back pain.    Physical Exam  BP (!) 146/82   Pulse 67   Temp 98.4 F (36.9 C) (Oral)   Ht 6' (1.829 m)   Wt (!) 446 lb 3.2 oz (202.4 kg)   SpO2 97%   BMI 60.52 kg/m   GEN: A/Ox3; pleasant , NAD, well nourished , BMI 60   HEENT:  [place]/AT,  EACs-clear, TMs-wnl, NOSE-clear, THROAT-clear, no lesions, no postnasal drip or exudate noted.  Class 4 MP airway   NECK:  Supple w/ fair ROM; no JVD; normal carotid impulses w/o bruits; no thyromegaly or nodules palpated; no lymphadenopathy.    RESP  Clear  P & A; w/o, wheezes/ rales/ or rhonchi. no accessory muscle use, no dullness to percussion  CARD:  RRR, no m/r/g, 1+ peripheral edema, pulses intact, no cyanosis or clubbing.  GI:   Soft & nt; nml bowel sounds; no organomegaly or masses detected.   Musco: Warm bil, no deformities or joint swelling noted.   Neuro: alert, no focal deficits noted.    Skin: Warm, no lesions or rashes    Lab Results:    BNP No results found for: "BNP"  ProBNP No results found for: "PROBNP"  Imaging: No results found.        No data to display          No results found for: "NITRICOXIDE"      Assessment & Plan:   Obstructive sleep apnea syndrome History of severe obstructive sleep apnea.  Repeat sleep study 2022 showed ongoing severe sleep apnea with AHI at 36.4/hour.  Patient has been maintained on BiPAP.   Patient says he cannot sleep without it and his machine is got old.  Order for new BiPAP has been sent.  Will continue with same BiPAP settings with auto BiPAP IPAP max at 22 cm H2O.  And EPAP minimum 18 cm H2O. Will have a follow-up visit with download.  Pending these results can decide if titration study is indicated. Concern for possible associated obesity hypoventilation syndrome with his BMI being at 60.     Patient education was given. - discussed how weight can impact sleep and risk for sleep disordered breathing - discussed options to assist with weight loss: combination of diet modification, cardiovascular and strength training exercises   - had an extensive discussion regarding the adverse health consequences related to untreated sleep disordered breathing - specifically discussed the risks for hypertension, coronary artery disease, cardiac dysrhythmias, cerebrovascular disease, and diabetes - lifestyle modification discussed   - discussed how sleep disruption can increase risk of accidents, particularly when driving - safe driving practices were discussed   Plan  Patient Instructions  BIPAP download.  Order for new BIPAP  Wear BIPAP all night long .  Work on healthy weight loss.  Follow up in 3 months and As needed      Morbid obesity ([place]) Healthy weight loss discussed     Rexene Edison, NP 03/17/2022

## 2022-03-19 IMAGING — US US RENAL
1 series · 14 of 25 positions shown · non-contrast
Comparison: None.

CLINICAL DATA: Chronic kidney disease

EXAM:
RENAL / URINARY TRACT ULTRASOUND COMPLETE

[Series 1: us renal · 14 of 51 slices shown]
[im 1/51]
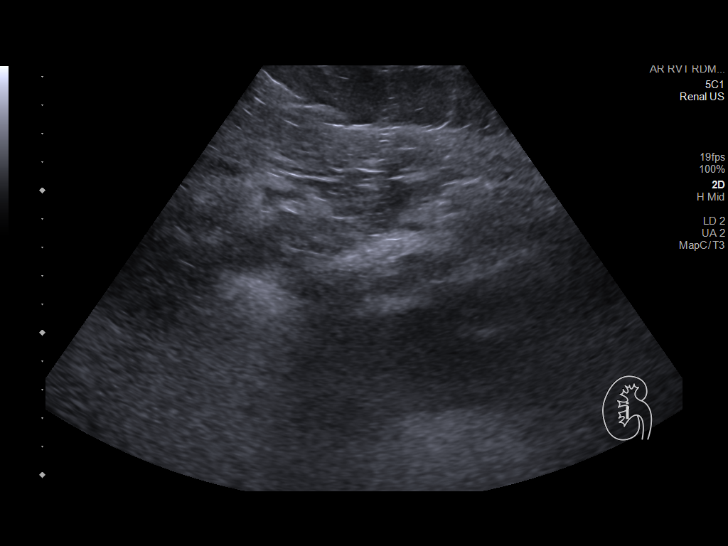
[im 5/51]
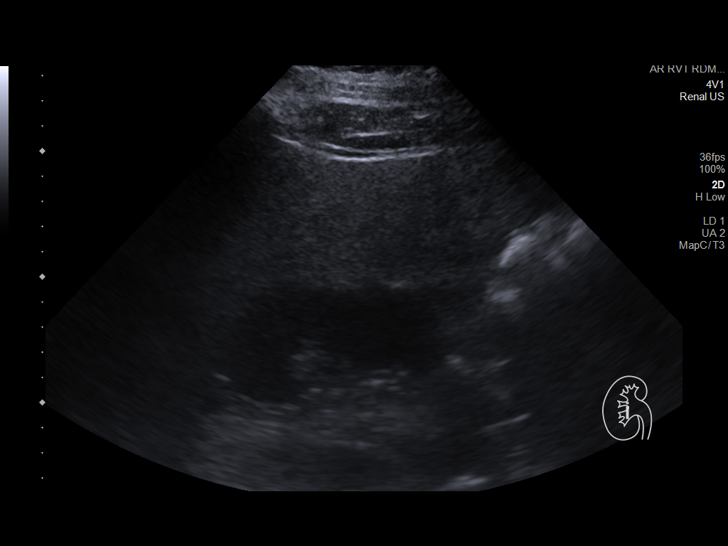
[im 9/51]
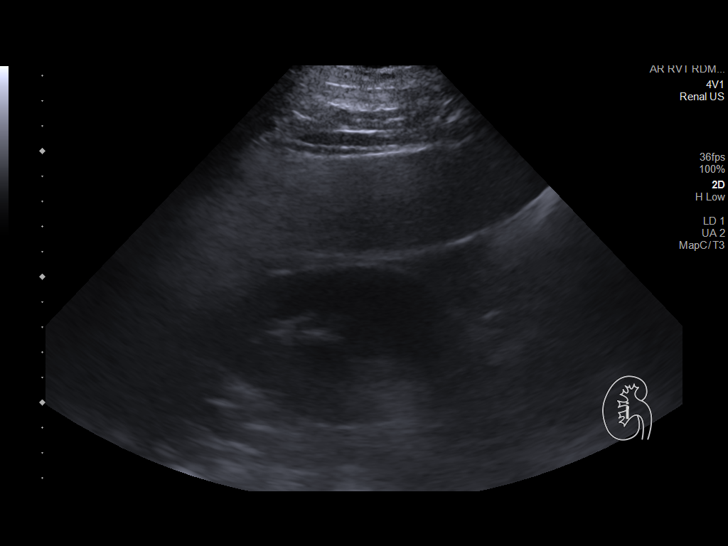
[im 13/51]
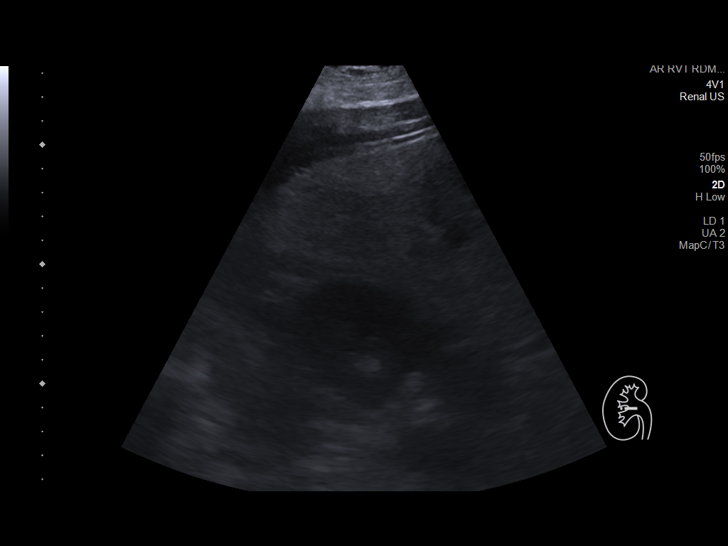
[im 17/51]
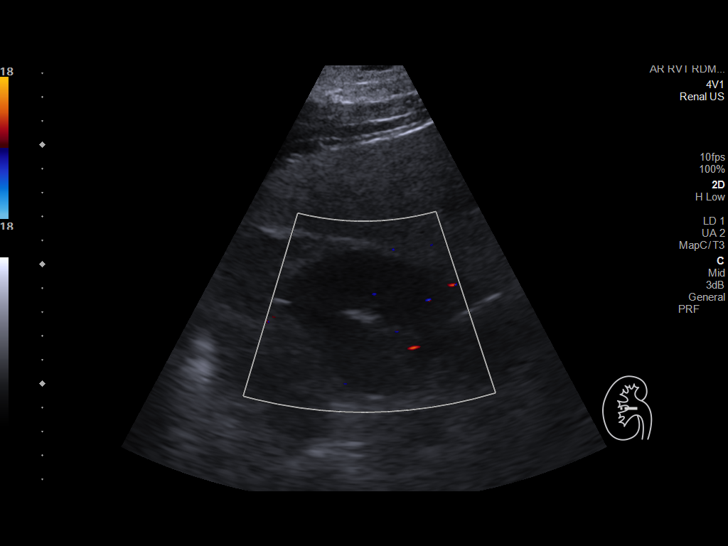
[im 19/51]
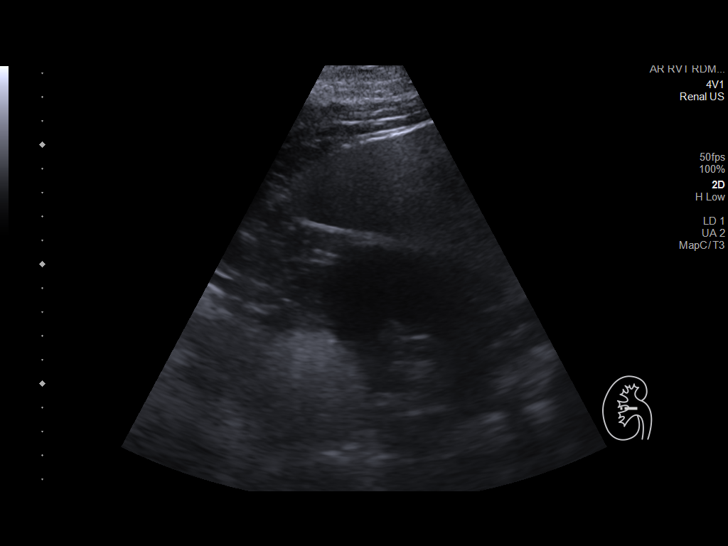
[im 23/51]
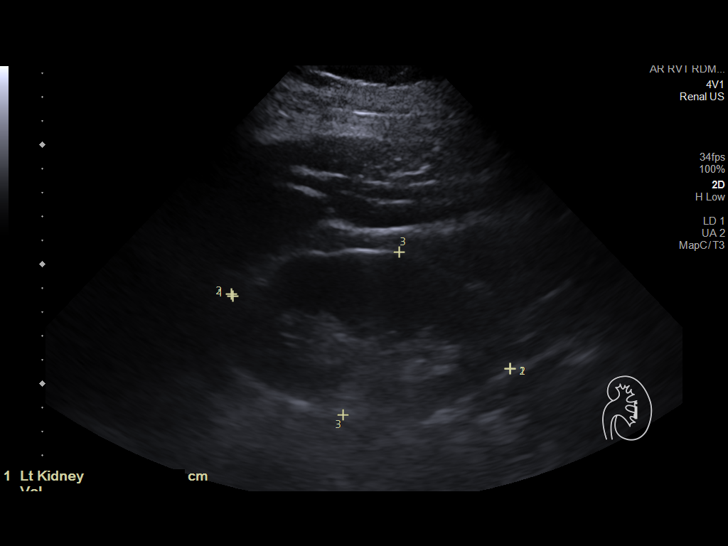
[im 28/51]
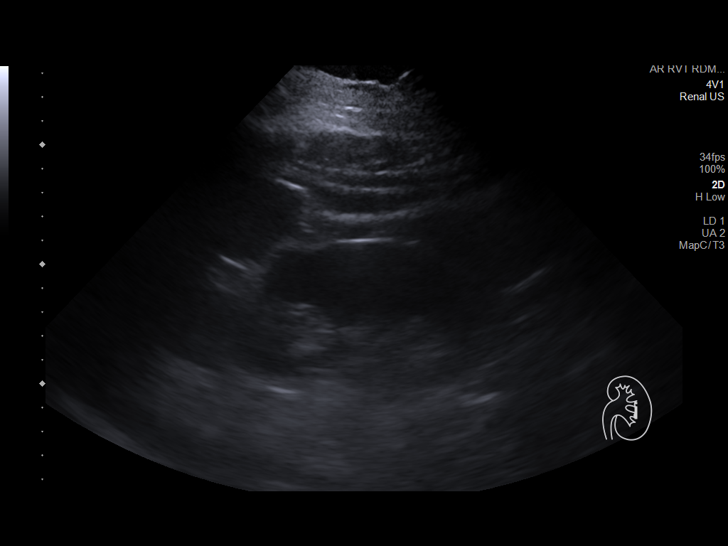
[im 32/51]
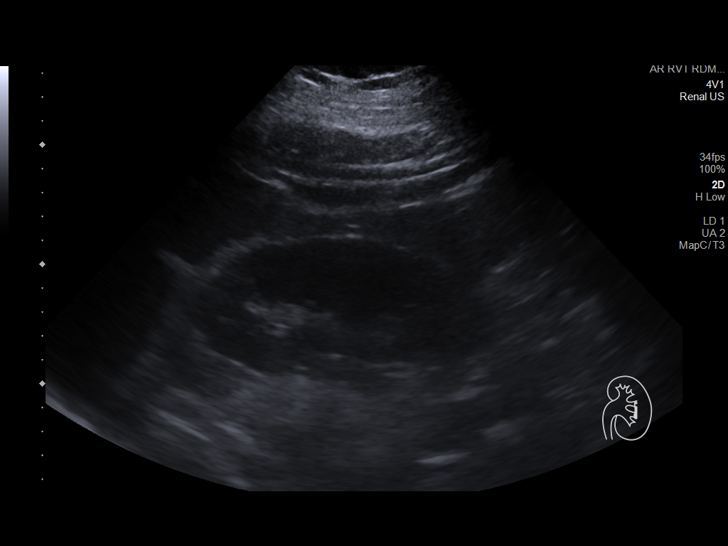
[im 34/51]
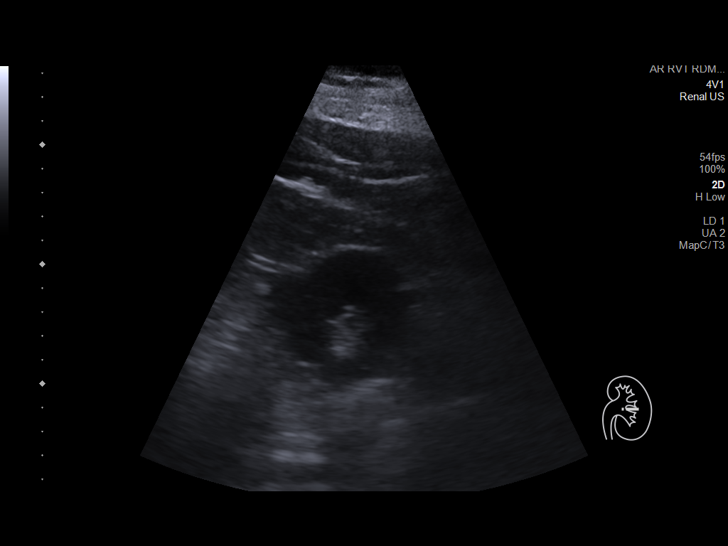
[im 38/51]
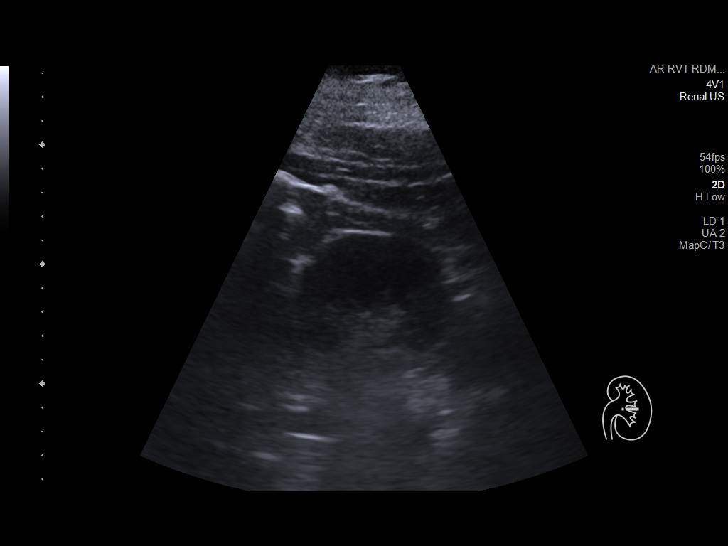
[im 42/51]
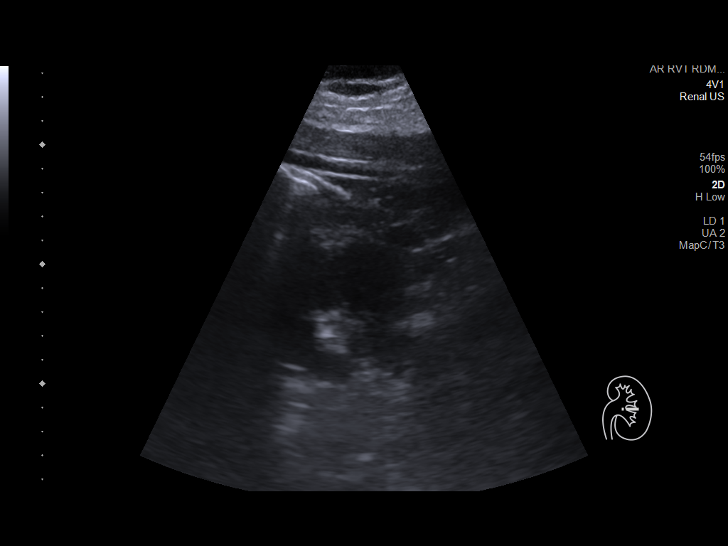
[im 46/51]
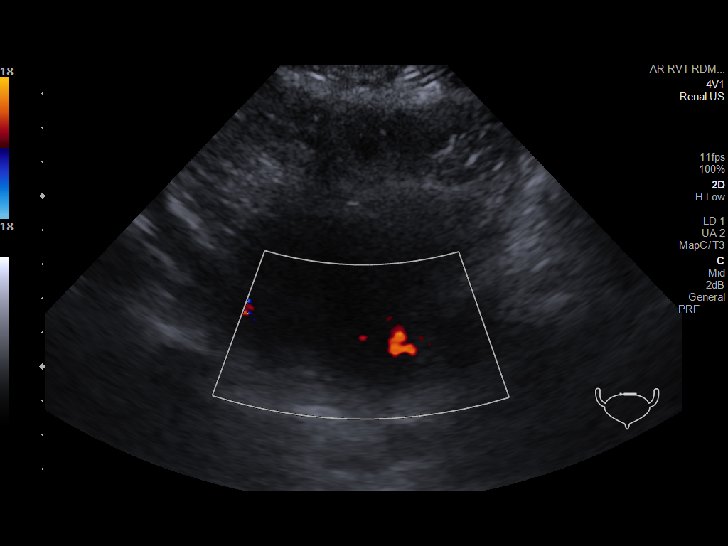
[im 51/51]
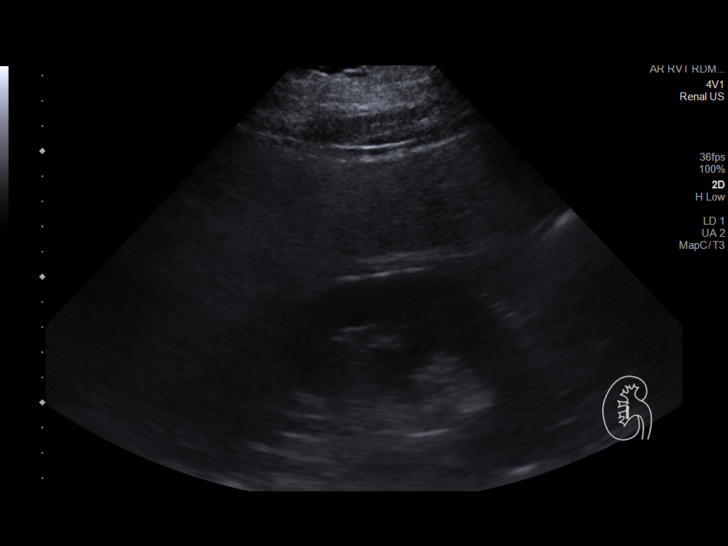

[14 of 25 positions shown; findings below may reference images not displayed]

FINDINGS: Right Kidney:

Renal measurements: 12.7 x 7.3 x 6.7 cm = volume: 326.4 mL.
Echogenicity within normal limits. No mass or hydronephrosis
visualized.

Left Kidney:

Renal measurements: 12.1 x 7.2 x 5.6 cm = volume: 254.5 mL.
Echogenicity within normal limits. No mass or hydronephrosis
visualized.

Bladder:

Appears normal for degree of bladder distention.

Other:

Liver appears echogenic suggesting steatosis
IMPRESSION: 1. Normal ultrasound appearance of the kidneys
2. Liver appears echogenic suggesting hepatic steatosis

## 2022-03-21 NOTE — Progress Notes (Signed)
Advanced Hypertension Clinic Follow-up:    Date:  03/24/2022   ID:  Natanel Snavely, DOB [birth year], MRN [id number]  PCP:  Waldon Reining, MD  Cardiologist:  Dina Rich, MD   Referring MD: Waldon Reining, MD   CC: Hypertension  History of Present Illness:    Melvin Ray is a 37 y.o. male with a hx of OSA on CPAP, tobacco abuse, and morbid obesity, here for follow-up. He was initially seen 03/21/2020 in the advanced hypertension clinic. He was first diagnosed with hypertension in his late teens.  He doesn't recall having a work up for the cause of his early onset hypertesion.  He was previously working with Dr. Wyline Mood due to lower extremity shortness of breath.  He was started on furosemide and instructed to reduce his sodium intake.  After seeing Dr. Wyline Mood he was started on clonidine by his PCP.    At his last appointment he reported average systolic blood pressures in the 130s-160s. He was not taking Lasix regularly due to urinary frequency. We started him on HCTZ 25 mg daily. He also noted his CPAP was malfunctioning. He had an echo 12/2019 revealing LVEF 60-65%, mild LVH, mildly dilated LV, normal RV, trivial MR, dilated IVC. He was referred for a new sleep study. Most recently he followed up with Ronie Spies, PA 02/27/2022 and his blood pressure was 148/88 on a regimen of chlorthalidone 25mg  daily, clonidine 0.3mg  BID, diltiazem 300mg  daily, hydralazine 100mg  TID, losartan 100mg  daily, Toprol 200mg  daily. Renin and aldosterone was ordered but not performed. He was also referred to pharmD to see if he is a candidate for GLP-1.  Today, he is accompanied by his . He states he is feeling nauseous for the past 2 days. He has not been able to eat well but is able to stomach liquids. At this time he denies any breathing issues or LE edema. Currently he does not monitor his blood pressure at home. In clinic today his BP is 149/90 (improved to 136/88 on recheck). Lately he is not  formally exercising. Previously he exercised 3 days a week, but fell out of the routine since the holidays. While exercising he usually does well without shortness of breath or anginal symptoms. They report he recently had a colonoscopy, and he is in the process of obtaining a new CPAP or BiPAP. He denies any palpitations, chest pain, lightheadedness, headaches, syncope, orthopnea, or PND.  Previous antihypertensives: Lisinopril labetalol Amlodipine   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   SVT (supraventricular tachycardia)    Tobacco abuse     Past Surgical History:  Procedure Laterality Date   APPENDECTOMY     BIOPSY  01/07/2022   Procedure: BIOPSY;  Surgeon: , MD;  [facility];  Service: Gastroenterology;;   COLONOSCOPY WITH PROPOFOL N/A 01/07/2022   Procedure: COLONOSCOPY WITH PROPOFOL;  Surgeon: Child psychotherapist, MD;  Location: [facility];  Service: Gastroenterology;  Laterality: N/A;  1030 ASA 3   ESOPHAGOGASTRODUODENOSCOPY  01/07/2022   Procedure: ESOPHAGOGASTRODUODENOSCOPY (EGD);  Surgeon: 01/09/2022, Dolores Frame, MD;  Location: [facility];  Service: Gastroenterology;;   GIVENS CAPSULE STUDY N/A 02/05/2022   Procedure: GIVENS CAPSULE STUDY;  Surgeon: Dolores Frame, MD;  [facility]: [facility];  Service: Gastroenterology;  Laterality: N/A;  8:30am    Current Medications: Current Meds  Medication Sig   ACCU-CHEK GUIDE test strip 3 (three) times daily.   Accu-Chek Softclix Lancets lancets 3 (three) times daily.   albuterol (VENTOLIN HFA) 108 (90 Base) MCG/ACT inhaler Inhale 1-2 puffs into the lungs every 6 (six) hours as needed for wheezing or shortness of breath.   amLODipine (NORVASC) 5 MG tablet Take 1  tablet (5 mg total) by mouth daily.   chlorthalidone (HYGROTON) 25 MG tablet TAKE 1 TABLET ONCE DAILY.   Cholecalciferol (VITAMIN D3) 50 MCG (2000 UT) TABS Take 2,000 Units by mouth daily.   cloNIDine (CATAPRES) 0.3 MG tablet Take 0.3 mg by mouth 3 (three) times daily.   diltiazem (CARDIZEM CD) 300 MG 24 hr capsule Take 300 mg by mouth daily.   FEROSUL 325 (65 Fe) MG tablet Take 325 mg by mouth daily.   FLUoxetine (PROZAC) 20 MG capsule Take 20 mg by mouth daily.   Fluticasone-Salmeterol (ADVAIR) 250-50 MCG/DOSE AEPB Inhale 1 puff into the lungs daily.   hydrALAZINE (APRESOLINE) 100 MG tablet Take 100 mg by mouth 3 (three) times daily.   insulin glargine (LANTUS SOLOSTAR) 100 UNIT/ML Solostar Pen Inject 80 Units into the skin at bedtime. (Patient taking differently: Inject 50 Units into the skin at bedtime.)   losartan (COZAAR) 100 MG tablet Take 100 mg by mouth at bedtime.   metFORMIN (GLUCOPHAGE-XR) 500 MG 24 hr tablet Take 1,000 mg by mouth 2 (two) times daily with breakfast and lunch.   metoprolol (TOPROL-XL) 200 MG 24 hr tablet Take 200 mg by mouth daily.   NON FORMULARY Pt uses a cpap nightly   pantoprazole (PROTONIX) 40 MG tablet Take 40 mg by mouth daily.   potassium chloride (KLOR-CON) 10 MEQ tablet Take 10 mEq by mouth daily.   traZODone (DESYREL) 100 MG tablet Take 200 mg by mouth at bedtime.      Allergies:   Patient has no known allergies.   Social History   Socioeconomic History   Marital status: Single    Spouse name: Not on file   Number of children: Not on file   Years of education: Not on file   Highest education level: Not on file  Occupational History   Not on file  Tobacco Use   Smoking status: Some Days    Types: Cigars    Passive exposure: Current   Smokeless tobacco: Former  Scientific laboratory technician Use: Every day  Substance and Sexual Activity   Alcohol use: Never   Drug use: Never   Sexual activity: Not on file  Other Topics Concern   Not on file   Social History Narrative   Not on file   Social Determinants of Health   Financial Resource Strain: Medium Risk (03/24/2022)   Overall Financial Resource Strain (CARDIA)    Difficulty of Paying Living Expenses: Somewhat hard  Food Insecurity: No Food Insecurity (03/24/2022)   Hunger Vital Sign    Worried About Running Out of Food in the Last Year: Never true    Ran Out of Food in the Last Year: Never true  Transportation Needs: No Transportation Needs (03/24/2022)   PRAPARE - Hydrologist (Medical): No    Lack of Transportation (Non-Medical): No  Physical Activity: Insufficiently Active (07/31/2020)   Exercise Vital Sign    Days of Exercise per Week: 3 days    Minutes of Exercise per Session: 40 min  Stress: Not on file  Social Connections: Not on file     Family History: The patient's family history includes Diabetes in his maternal grandmother; Heart failure in his father and paternal grandmother; Hypertension in his father.  ROS:   Please see the history of present illness.    (+) Nausea All other systems reviewed and are negative.  EKGs/Labs/Other Studies Reviewed:    Renal Ultrasound  03/01/2021: IMPRESSION: 1. Normal ultrasound appearance of the kidneys 2. Liver appears echogenic suggesting hepatic steatosis  Echocardiogram  12/28/2019:  1. Left ventricular ejection fraction, by estimation, is 60 to 65%. The  left ventricle has normal function. The left ventricle has no regional  wall motion abnormalities. The left ventricular internal cavity size was  mildly dilated. There is mild left  ventricular hypertrophy. Left ventricular diastolic parameters are  indeterminate.   2. Right ventricular systolic function is normal. The right ventricular  size is normal. Tricuspid regurgitation signal is inadequate for assessing  PA pressure.   3. The mitral valve is grossly normal. Trivial mitral valve  regurgitation.   4. The aortic valve is  tricuspid. Aortic valve regurgitation is not  visualized.   5. The inferior vena cava is dilated in size with >50% respiratory  variability, suggesting right atrial pressure of 8 mmHg.   Renal Artery Dopplers  08/10/2009 ([facility]): FINAL PHYSICIAN INTERPRETATION:  No evidence of any critical main renal artery stenosis on the right side.  Study does not exclude dissection or accessory/ branch level disease.  No evidence of any critical main renal artery stenosis on the left side.  Study does not exclude dissection or accessory/ branch level disease.   EKG:  EKG is personally reviewed. 03/24/2022:  EKG was not ordered. 01/13/20: sinus rhythm.  Rate 75 bpm.  LVH  Recent Labs: 05/09/2021: ALT 19 07/23/2021: TSH 4.81 01/02/2022: BUN 16; Creatinine, Ser 1.11; Hemoglobin 11.7; Platelets 209; Potassium 3.2; Sodium 141   Recent Lipid Panel    Component Value Date/Time   CHOL 169 07/23/2021 0000   TRIG 264 (A) 07/23/2021 0000   HDL 36 07/23/2021 0000   LDLCALC 95 07/23/2021 0000    Physical Exam:    VS:  BP 136/88 (BP Location: Left Arm, Patient Position: Sitting, Cuff Size: Large)   Pulse 74   Ht 6' (1.829 m)   Wt (!) 440 lb 9.6 oz (199.9 kg)   SpO2 97%   BMI 59.76 kg/m  , BMI Body mass index is 59.76 kg/m. GENERAL:  Well appearing.  No acute distress HEENT: Pupils equal round and reactive, fundi not visualized, oral mucosa unremarkable NECK:  No jugular venous distention, waveform within normal limits, carotid upstroke brisk and symmetric, no bruits LUNGS:  Clear to auscultation bilaterally HEART:  RRR.  PMI not displaced or sustained,S1 and S2 within normal limits, no S3, no S4, no clicks, no rubs, no murmurs ABD:  Positive bowel sounds normal in frequency in pitch, no bruits, no rebound, no guarding, no midline pulsatile mass, no hepatomegaly, no splenomegaly EXT:  2 plus pulses throughout, no edema, no cyanosis no clubbing SKIN:  No rashes no nodules NEURO:  Cranial  nerves II through XII grossly intact, motor grossly intact throughout PSYCH:  Cognitively intact, oriented to person place and time   ASSESSMENT:    1. Resistant hypertension   2. Obstructive sleep apnea syndrome   3. Morbid obesity (HCC)   4. Mixed hyperlipidemia      PLAN:    Resistant hypertension BP  remains uncontrolled.  He is in the process of getting a sleep study.  He also saw our PharmD to consider GLP-1 agonists for weight loss.  He isn't feeling well at this time but will work on increasing his exercise again.  Continue chlorthalidone, diltiazem, clonidine, hydralazine, losartan and metoprolol.  Add amlodipine 5mg .  Consider switching losartan back to valsartan or olmesartan.  Consider switching metoprolol to carvedilol.  He gets his medication in blister packs and just had them filled at higher expense than usual.  Will discuss with our social work team.  Check renin/aldosterone.  Consider renal denervation only once secondary causes are ruled out and lifestyle modification attempts.  Obstructive sleep apnea syndrome Sleep study pending as above.  Morbid obesity ([place]) Increasing exercise and starting GLP-1 agonists as above.  Mixed hyperlipidemia LDL 95 06/2021.  Consider statin. He is overwhelmed with medications at this time.  Consider addressing at follow up.   Disposition:    FU with MD/PharmD in 1 month.   Medication Adjustments/Labs and Tests Ordered: Current medicines are reviewed at length with the patient today.  Concerns regarding medicines are outlined above.   Orders Placed This Encounter  Procedures   Basic metabolic panel   Magnesium   Aldosterone + renin activity w/ ratio   Meds ordered this encounter  Medications   amLODipine (NORVASC) 5 MG tablet    Sig: Take 1 tablet (5 mg total) by mouth daily.    Dispense:  90 tablet    Refill:  3   I,Mathew Stumpf,acting as a scribe for Skeet Latch, MD.,have documented all relevant documentation on  the behalf of Skeet Latch, MD,as directed by  Skeet Latch, MD while in the presence of Skeet Latch, MD.  I, [place] Oval Linsey, MD have reviewed all documentation for this visit.  The documentation of the exam, diagnosis, procedures, and orders on 03/24/2022 are all accurate and complete.   Signed, Skeet Latch, MD  03/24/2022 4:27 PM    [place]

## 2022-03-21 NOTE — Progress Notes (Signed)
Reviewed and agree with assessment/plan.   Jakari Sada, MD [facility] 03/21/2022, 7:21 AM Pager:  [phone]  

## 2022-03-24 ENCOUNTER — Ambulatory Visit
Payer: Medicaid Other | Attending: Cardiovascular Disease | Admitting: Pharmacist Clinician (PhC)/ Clinical Pharmacy Specialist

## 2022-03-24 ENCOUNTER — Ambulatory Visit (INDEPENDENT_AMBULATORY_CARE_PROVIDER_SITE_OTHER): Payer: Medicaid Other | Admitting: Cardiovascular Disease

## 2022-03-24 ENCOUNTER — Other Ambulatory Visit (HOSPITAL_COMMUNITY): Payer: Self-pay

## 2022-03-24 ENCOUNTER — Encounter (HOSPITAL_BASED_OUTPATIENT_CLINIC_OR_DEPARTMENT_OTHER): Payer: Self-pay | Admitting: Cardiovascular Disease

## 2022-03-24 VITALS — BP 136/88 | HR 74 | Ht 72.0 in | Wt >= 6400 oz

## 2022-03-24 DIAGNOSIS — I1A0 Resistant hypertension: Secondary | ICD-10-CM | POA: Diagnosis not present

## 2022-03-24 DIAGNOSIS — G4733 Obstructive sleep apnea (adult) (pediatric): Secondary | ICD-10-CM

## 2022-03-24 DIAGNOSIS — E782 Mixed hyperlipidemia: Secondary | ICD-10-CM | POA: Diagnosis not present

## 2022-03-24 MED ORDER — AMLODIPINE BESYLATE 5 MG PO TABS: 5.0000 mg | ORAL_TABLET | Freq: Every day | ORAL | 3 refills | Status: DC

## 2022-03-24 NOTE — Patient Instructions (Signed)
We will start the prior authorization process to get [facility] covered by your insurance.   TIPS FOR SUCCESS Write down the reasons why you want to lose weight and post it in a place where you'll see it often. Start small and work your way up. Keep in mind that it takes time to achieve goals, and small steps add up. Any additional movements help to burn calories. Taking the stairs rather than the elevator and parking at the far end of your parking lot are easy ways to start. Brisk walking for at least 30 minutes 4 or more days of the week is an excellent goal to work toward  [place] Did you know that it can take 15 minutes or more for your brain to receive the message that you've eaten? That means that, if you eat less food, but consume it slower, you may still feel satisfied. Eating a lot of fruits and vegetables can also help you feel fuller. Eat off of smaller plates so that moderate portions don't seem too small  TITRATION PLAN Will plan to follow the titration plan as below, pending patient is tolerating each dose before increasing to the next. Can slow titration if needed for tolerability.    -Weeks 1-4: Inject 2.5 mg SQ once weekly  -Weeks 5-8: Inject 5 mg SQ once weekly  -Weeks 9-12 Inject 7.5jmg SQ once weekly  -Weeks 13-16: Inject 10 mg SQ once weekly   Follow up in 3 months.  If you have any questions or concerns, please reach out to [place].  Vannesa Abair/Chris at [phone].  [place]

## 2022-03-24 NOTE — Assessment & Plan Note (Addendum)
BP remains uncontrolled.  He is in the process of getting a sleep study.  He also saw our PharmD to consider GLP-1 agonists for weight loss.  He isn't feeling well at this time but will work on increasing his exercise again.  Continue chlorthalidone, diltiazem, clonidine, hydralazine, losartan and metoprolol.  Add amlodipine 5mg .  Consider switching losartan back to valsartan or olmesartan.  Consider switching metoprolol to carvedilol.  He gets his medication in blister packs and just had them filled at higher expense than usual.  Will discuss with our social work team.  Check renin/aldosterone.  Consider renal denervation only once secondary causes are ruled out and lifestyle modification attempts.

## 2022-03-24 NOTE — Assessment & Plan Note (Signed)
LDL 95 06/2021.  Consider statin. He is overwhelmed with medications at this time.  Consider addressing at follow up.

## 2022-03-24 NOTE — Assessment & Plan Note (Signed)
Increasing exercise and starting GLP-1 agonists as above.

## 2022-03-24 NOTE — Assessment & Plan Note (Signed)
Sleep study pending as above.

## 2022-03-24 NOTE — Progress Notes (Unsigned)
Office Visit    Patient Name: Melvin Ray Date of Encounter: 03/25/2022  Primary Care Provider:  Leonie Douglas, MD Primary Cardiologist:  Melvin Dolly, MD  Chief Complaint    Weight management  Significant Past Medical History   Obesity BMI 60.5  DM A1c 10.2; lantus 80 units at bedtime, glipizide 10 mg daily, metformin 1000 mg BID  HTN/SVT chlorthalidone 25 mg daily, clonidine 0.3 mg BID, diltiazem 300 mg daily, losartan 100 mg daily, metoprolol succinate 200 mg daily, hydralazine 100 mg TID  OSA BiPAP    No Known Allergies  History of Present Illness    Melvin Ray is a 37 year old male who was referred by Melvin Copa, NP to discuss diabetes/weight loss medications. He has a BMI of 60.5 and he also has T2DM with an A1c of 10.2. He presents to the PharmD clinic today with social worker Melvin Ray who is his main point of contact. He has not tried any medications for weight loss in the past but has tried lifestyle modifications. He is concerned about the cost of his medications with adding on a new injectable as his current medications are already expensive. He did have a health coach previously and is working to get connected with them again.   Confirmed patient has no personal or family history of medullary thyroid carcinoma (MTC) or Multiple Endocrine Neoplasia syndrome type 2 (MEN 2).  Adherence: pill packs from [place]   Family Hx: Diabetes in his maternal grandmother; Heart failure in his father and paternal grandmother; Hypertension in his father  Social Hx: smokes cigars, vapes regularly, denies alcohol use  Diet: Likes to eat frozen meals at home, eats a variety of meats (no pork), snacks on walnuts and chips   Exercise: walks on the treadmill for an hour three times per week   Accessory Clinical Findings    Lab Results  Component Value Date   CREATININE 1.11 01/02/2022   BUN 16 01/02/2022   NA 141 01/02/2022   K 3.2 (L) 01/02/2022   CL  103 01/02/2022   CO2 28 01/02/2022   Lab Results  Component Value Date   ALT 19 05/09/2021   AST 3 (A) 05/09/2021   ALKPHOS 100 05/09/2021   Lab Results  Component Value Date   HGBA1C 10.2 07/23/2021      Home Medications/Allergies    Current Outpatient Medications  Medication Sig Dispense Refill   ACCU-CHEK GUIDE test strip 3 (three) times daily.     Accu-Chek Softclix Lancets lancets 3 (three) times daily.     albuterol (VENTOLIN HFA) 108 (90 Base) MCG/ACT inhaler Inhale 1-2 puffs into the lungs every 6 (six) hours as needed for wheezing or shortness of breath.     amLODipine (NORVASC) 5 MG tablet Take 1 tablet (5 mg total) by mouth daily. 90 tablet 3   chlorthalidone (HYGROTON) 25 MG tablet TAKE 1 TABLET ONCE DAILY. 90 tablet 3   Cholecalciferol (VITAMIN D3) 50 MCG (2000 UT) TABS Take 2,000 Units by mouth daily.     cloNIDine (CATAPRES) 0.3 MG tablet Take 0.3 mg by mouth 3 (three) times daily.     diltiazem (CARDIZEM CD) 300 MG 24 hr capsule Take 300 mg by mouth daily.     FEROSUL 325 (65 Fe) MG tablet Take 325 mg by mouth daily.     FLUoxetine (PROZAC) 20 MG capsule Take 20 mg by mouth daily.     Fluticasone-Salmeterol (ADVAIR) 250-50 MCG/DOSE AEPB Inhale 1  puff into the lungs daily.     hydrALAZINE (APRESOLINE) 100 MG tablet Take 100 mg by mouth 3 (three) times daily.     insulin glargine (LANTUS SOLOSTAR) 100 UNIT/ML Solostar Pen Inject 80 Units into the skin at bedtime. (Patient taking differently: Inject 50 Units into the skin at bedtime.) 30 mL 2   losartan (COZAAR) 100 MG tablet Take 100 mg by mouth at bedtime.     metFORMIN (GLUCOPHAGE-XR) 500 MG 24 hr tablet Take 1,000 mg by mouth 2 (two) times daily with breakfast and lunch.     metoprolol (TOPROL-XL) 200 MG 24 hr tablet Take 200 mg by mouth daily.     NON FORMULARY Pt uses a cpap nightly     pantoprazole (PROTONIX) 40 MG tablet Take 40 mg by mouth daily.     potassium chloride (KLOR-CON) 10 MEQ tablet Take 10 mEq  by mouth daily.     traZODone (DESYREL) 100 MG tablet Take 200 mg by mouth at bedtime.      No current facility-administered medications for this visit.     No Known Allergies  Assessment & Plan    Morbid obesity ([place]) Assessment and Plan: Patient has not met weight goal of at least 5% of body weight loss with comprehensive lifestyle modifications alone in the past 3-6 months. Pharmacotherapy is appropriate to pursue as augmentation. Will start Mounjaro. Confirmed patient has no personal or family history of medullary thyroid carcinoma (MTC) or Multiple Endocrine Neoplasia syndrome type 2 (MEN 2).  Advised patient on common side effects including nausea, diarrhea, dyspepsia, decreased appetite, and fatigue. Counseled patient on reducing meal size and how to titrate medication to minimize side effects. Patient aware to call if intolerable side effects or if experiencing dehydration, abdominal pain, or dizziness. Patient will adhere to dietary modifications and will target at least 150 minutes of moderate intensity exercise weekly.  Injection technique reviewed at today's visit.   Titration Plan:  Will plan to follow the titration plan as below, pending patient is tolerating each dose before increasing to the next. Can slow titration if needed for tolerability. Weeks 1-4: Inject 2.5 mg SQ once weekly  Weeks 5-8: Inject 5 mg SQ once weekly  Weeks 9-12 Inject 7.5 mg SQ once weekly  Weeks 13-16: Inject 10 mg SQ once weekly  Follow up with 3 months with PharmD clinic to assess weight loss  [place] [facility]  Class of 2024  I was with student and patient for entire visit and agree with above assessment and plan.  Melvin Ray PharmD CPP [place]  [address] [place], [place] [address] [phone]

## 2022-03-24 NOTE — Progress Notes (Signed)
Heart and Vascular Care Navigation  03/24/2022  Chaynce Schafer Oct 28, 1985 [id number]  Reason for Referral: medication costs Patient is participating in a Managed Medicaid Plan:Yes  Engaged with patient face to face for follow up visit for Heart and Vascular Care Coordination.                                                                                                   Assessment:     LCSW spoke with pt today- introduced self, role, reason for visit. Pt and pt SW from [employer] Reyne Dumas) share that pt Medicaid had previously been covering things at $0 copays. When they went to pick up this month they cost $60 total. I encouraged them to see if pharmacy needs prior authorization for any medications, to ask about waiving copays for Medicaid, and if that doesn't work to speak with Dept of Health pharmacy services to see if they have any recommendations or additional assistance for Medicaid beneficiaries. Pt gives verbal permission and DPR on file to contact [place] next week to see if any updates. No additional questions from pt at this time.                      HRT/VAS Care Coordination     Patients Home Cardiology Office [facility]   Outpatient Care Team Social Worker   Social Worker Name: Westley Hummer, [place], [place]   Living arrangements for the past 2 months Apartment   Lives with: Self   Patient Current Insurance Coverage Medicaid   Patient Has Concern With Paying Medical Bills Yes   Patient Concerns With Medical Bills medication costs   Medical Bill Referrals: Dept of Health, Discuss Prior Auth and Copay Waivers   Does Patient Have Prescription Coverage? Yes   Home Assistive Devices/Equipment None       Social History:                                                                             SDOH Screenings   Food Insecurity: No Food Insecurity (03/24/2022)  Housing: Low Risk  (03/24/2022)  Transportation Needs: No Transportation Needs (03/24/2022)  Utilities: Not At Risk  (03/24/2022)  Financial Resource Strain: Medium Risk (03/24/2022)  Physical Activity: Insufficiently Active (07/31/2020)  Tobacco Use: High Risk (03/24/2022)    SDOH Interventions: Financial Resources:  Financial Strain Interventions: Other (Comment) (medication affordability - we discussed utilizing medicaid copay waivers that certain pharmacies honor, speaking with Dept of [place] and ensuring nothing needs prior authorizations.)  Food Insecurity:  Food Insecurity Interventions: Intervention Not Indicated  Housing Insecurity:  Housing Interventions: Intervention Not Indicated  Transportation:   Transportation Interventions: Patient Resources (Friends/Family), Payor Benefit    Other Care Navigation Interventions:     Provided Pharmacy assistance resources  See assessment  above   Follow-up plan:

## 2022-03-24 NOTE — Patient Instructions (Addendum)
Medication Instructions:  START AMLODIPINE 5 MG DAILY    Labwork: LABS AS ORDERED    Testing/Procedures: NONE   Follow-Up: 04/24/2022  1:30 PM WITH PHARM D AT [facility] OFFICE   Special Instructions:  ISABEL WILL CALL YOU TO FOLLOW UP IN ABOUT 1 WEEK   DASH Eating Plan DASH stands for "Dietary Approaches to Stop Hypertension." The DASH eating plan is a healthy eating plan that has been shown to reduce high blood pressure (hypertension). It may also reduce your risk for type 2 diabetes, heart disease, and stroke. The DASH eating plan may also help with weight loss. What are tips for following this plan?  General guidelines Avoid eating more than 2,300 mg (milligrams) of salt (sodium) a day. If you have hypertension, you may need to reduce your sodium intake to 1,500 mg a day. Limit alcohol intake to no more than 1 drink a day for nonpregnant women and 2 drinks a day for men. One drink equals 12 oz of beer, 5 oz of wine, or 1 oz of hard liquor. Work with your health care provider to maintain a healthy body weight or to lose weight. Ask what an ideal weight is for you. Get at least 30 minutes of exercise that causes your heart to beat faster (aerobic exercise) most days of the week. Activities may include walking, swimming, or biking. Work with your health care provider or diet and nutrition specialist (dietitian) to adjust your eating plan to your individual calorie needs. Reading food labels  Check food labels for the amount of sodium per serving. Choose foods with less than 5 percent of the Daily Value of sodium. Generally, foods with less than 300 mg of sodium per serving fit into this eating plan. To find whole grains, look for the word "whole" as the first word in the ingredient list. Shopping Buy products labeled as "low-sodium" or "no salt added." Buy fresh foods. Avoid canned foods and premade or frozen meals. Cooking Avoid adding salt when cooking. Use salt-free seasonings or  herbs instead of table salt or sea salt. Check with your health care provider or pharmacist before using salt substitutes. Do not fry foods. Cook foods using healthy methods such as baking, boiling, grilling, and broiling instead. Cook with heart-healthy oils, such as olive, canola, soybean, or sunflower oil. Meal planning Eat a balanced diet that includes: 5 or more servings of fruits and vegetables each day. At each meal, try to fill half of your plate with fruits and vegetables. Up to 6-8 servings of whole grains each day. Less than 6 oz of lean meat, poultry, or fish each day. A 3-oz serving of meat is about the same size as a deck of cards. One egg equals 1 oz. 2 servings of low-fat dairy each day. A serving of nuts, seeds, or beans 5 times each week. Heart-healthy fats. Healthy fats called Omega-3 fatty acids are found in foods such as flaxseeds and coldwater fish, like sardines, salmon, and mackerel. Limit how much you eat of the following: Canned or prepackaged foods. Food that is high in trans fat, such as fried foods. Food that is high in saturated fat, such as fatty meat. Sweets, desserts, sugary drinks, and other foods with added sugar. Full-fat dairy products. Do not salt foods before eating. Try to eat at least 2 vegetarian meals each week. Eat more home-cooked food and less restaurant, buffet, and fast food. When eating at a restaurant, ask that your food be prepared with less salt or  no salt, if possible. What foods are recommended? The items listed may not be a complete list. Talk with your dietitian about what dietary choices are best for you. Grains Whole-grain or whole-wheat bread. Whole-grain or whole-wheat pasta. Brown rice. [place]. Bulgur. Whole-grain and low-sodium cereals. Pita bread. Low-fat, low-sodium crackers. Whole-wheat flour tortillas. Vegetables Fresh or frozen vegetables (raw, steamed, roasted, or grilled). Low-sodium or reduced-sodium tomato and  vegetable juice. Low-sodium or reduced-sodium tomato sauce and tomato paste. Low-sodium or reduced-sodium canned vegetables. Fruits All fresh, dried, or frozen fruit. Canned fruit in natural juice (without added sugar). Meat and other protein foods Skinless chicken or [place]. Ground chicken or [place]. Pork with fat trimmed off. Fish and seafood. Egg whites. Dried beans, peas, or lentils. Unsalted nuts, nut butters, and seeds. Unsalted canned beans. Lean cuts of beef with fat trimmed off. Low-sodium, lean deli meat. Dairy Low-fat (1%) or fat-free (skim) milk. Fat-free, low-fat, or reduced-fat cheeses. Nonfat, low-sodium ricotta or cottage cheese. Low-fat or nonfat yogurt. Low-fat, low-sodium cheese. Fats and oils Soft margarine without trans fats. Vegetable oil. Low-fat, reduced-fat, or light mayonnaise and salad dressings (reduced-sodium). Canola, safflower, olive, soybean, and sunflower oils. Avocado. Seasoning and other foods Herbs. Spices. Seasoning mixes without salt. Unsalted popcorn and pretzels. Fat-free sweets. What foods are not recommended? The items listed may not be a complete list. Talk with your dietitian about what dietary choices are best for you. Grains Baked goods made with fat, such as croissants, muffins, or some breads. Dry pasta or rice meal packs. Vegetables Creamed or fried vegetables. Vegetables in a cheese sauce. Regular canned vegetables (not low-sodium or reduced-sodium). Regular canned tomato sauce and paste (not low-sodium or reduced-sodium). Regular tomato and vegetable juice (not low-sodium or reduced-sodium). Angie Fava. Olives. Fruits Canned fruit in a light or heavy syrup. Fried fruit. Fruit in cream or butter sauce. Meat and other protein foods Fatty cuts of meat. Ribs. Fried meat. Berniece Salines. Sausage. Bologna and other processed lunch meats. Salami. Fatback. Hotdogs. Bratwurst. Salted nuts and seeds. Canned beans with added salt. Canned or smoked fish. Whole eggs or  egg yolks. Chicken or [place] with skin. Dairy Whole or 2% milk, cream, and half-and-half. Whole or full-fat cream cheese. Whole-fat or sweetened yogurt. Full-fat cheese. Nondairy creamers. Whipped toppings. Processed cheese and cheese spreads. Fats and oils Butter. Stick margarine. Lard. Shortening. Ghee. Bacon fat. Tropical oils, such as coconut, palm kernel, or palm oil. Seasoning and other foods Salted popcorn and pretzels. Onion salt, garlic salt, seasoned salt, table salt, and sea salt. Worcestershire sauce. Tartar sauce. Barbecue sauce. Teriyaki sauce. Soy sauce, including reduced-sodium. Steak sauce. Canned and packaged gravies. Fish sauce. Oyster sauce. Cocktail sauce. Horseradish that you find on the shelf. Ketchup. Mustard. Meat flavorings and tenderizers. Bouillon cubes. Hot sauce and Tabasco sauce. Premade or packaged marinades. Premade or packaged taco seasonings. Relishes. Regular salad dressings. Where to find more information: National Heart, Lung, and [place]: https://wilson-eaton.com/ American Heart Association: www.heart.org Summary The DASH eating plan is a healthy eating plan that has been shown to reduce high blood pressure (hypertension). It may also reduce your risk for type 2 diabetes, heart disease, and stroke. With the DASH eating plan, you should limit salt (sodium) intake to 2,300 mg a day. If you have hypertension, you may need to reduce your sodium intake to 1,500 mg a day. When on the DASH eating plan, aim to eat more fresh fruits and vegetables, whole grains, lean proteins, low-fat dairy, and heart-healthy fats. Work with your  health care provider or diet and nutrition specialist (dietitian) to adjust your eating plan to your individual calorie needs. This information is not intended to replace advice given to you by your health care provider. Make sure you discuss any questions you have with your health care provider. Document Released: 01/30/2011 Document Revised:  01/23/2017 Document Reviewed: 02/04/2016 Elsevier Patient Education  2020 [employer].

## 2022-03-25 ENCOUNTER — Encounter: Payer: Self-pay | Admitting: Pharmacist Clinician (PhC)/ Clinical Pharmacy Specialist

## 2022-03-25 ENCOUNTER — Telehealth: Payer: Self-pay | Admitting: "Endocrinology

## 2022-03-25 ENCOUNTER — Other Ambulatory Visit (HOSPITAL_COMMUNITY): Payer: Self-pay

## 2022-03-25 NOTE — Assessment & Plan Note (Signed)
Assessment and Plan: Patient has not met weight goal of at least 5% of body weight loss with comprehensive lifestyle modifications alone in the past 3-6 months. Pharmacotherapy is appropriate to pursue as augmentation. Will start Mounjaro. Confirmed patient has no personal or family history of medullary thyroid carcinoma (MTC) or Multiple Endocrine Neoplasia syndrome type 2 (MEN 2).  Advised patient on common side effects including nausea, diarrhea, dyspepsia, decreased appetite, and fatigue. Counseled patient on reducing meal size and how to titrate medication to minimize side effects. Patient aware to call if intolerable side effects or if experiencing dehydration, abdominal pain, or dizziness. Patient will adhere to dietary modifications and will target at least 150 minutes of moderate intensity exercise weekly.  Injection technique reviewed at today's visit.   Titration Plan:  Will plan to follow the titration plan as below, pending patient is tolerating each dose before increasing to the next. Can slow titration if needed for tolerability. Weeks 1-4: Inject 2.5 mg SQ once weekly  Weeks 5-8: Inject 5 mg SQ once weekly  Weeks 9-12 Inject 7.5 mg SQ once weekly  Weeks 13-16: Inject 10 mg SQ once weekly  Follow up with 3 months with PharmD clinic to assess weight loss

## 2022-03-25 NOTE — Telephone Encounter (Signed)
Pt's social worker is asking for an appt. He has not been here a while. Do you want to update labs? Scheduled for March  [email or web address]

## 2022-03-26 NOTE — Telephone Encounter (Signed)
Tried to call Melvin Ray, no answer no vm

## 2022-04-01 ENCOUNTER — Telehealth: Payer: Self-pay | Admitting: Licensed Clinical Social Worker

## 2022-04-01 NOTE — Telephone Encounter (Signed)
H&V Care Navigation CSW Progress Note  Clinical Education officer, museum contacted caregiver by phone ([address] [place], [place] on file) to f/u on issues with medication copays. Charlotte Crumb was reached at [phone]. I re-introduced self, role, reason for call. Johnny states [facility] has a ticket in for pt as they state there shouldn't be copays for pt. Unclear either way on my end, but there may be a way that pt can work with pharmacy to waive copays. Johnny inquired about injectable discussed during previous appt with pharmacy. Unsure if there was a prior auth started and Charlotte Crumb has questions about how medication works regarding weight loss. I will direct these to Erasmo Downer to address as able. No additional questions/concerns from [facility] for me at this time.   Patient is participating in a Managed Medicaid Plan:  Yes- [place] [employer] Medicaid  [place]: No Food Insecurity (03/24/2022)  Housing: Low Risk  (03/24/2022)  Transportation Needs: No Transportation Needs (03/24/2022)  Utilities: Not At Risk (03/24/2022)  Financial Resource Strain: Medium Risk (03/24/2022)  Physical Activity: Insufficiently Active (07/31/2020)  Tobacco Use: High Risk (03/25/2022)    Westley Hummer, MSW, [place]  [phone]- work cell phone (preferred) [phone]- desk phone

## 2022-04-02 ENCOUNTER — Other Ambulatory Visit (HOSPITAL_COMMUNITY): Payer: Self-pay

## 2022-04-02 MED ORDER — SEMAGLUTIDE(0.25 OR 0.5MG/DOS) 2 MG/3ML ~~LOC~~ SOPN: 0.2500 mg | PEN_INJECTOR | SUBCUTANEOUS | 1 refills | Status: DC

## 2022-04-02 NOTE — Addendum Note (Signed)
Addended by: Rockne Menghini on: 04/02/2022 10:42 AM   Modules accepted: Orders

## 2022-04-02 NOTE — Telephone Encounter (Signed)
Spoke with Charlotte Crumb - answered questions about mechanism of action.  Insurance won't pay for [facility] unless patient has previously failed 2 other GLP1's.  Will start Ozempic (no PA required)  Reviewed dosing with Johnny, who will help patient get started.  Follow up scheduled for April

## 2022-04-08 ENCOUNTER — Ambulatory Visit (INDEPENDENT_AMBULATORY_CARE_PROVIDER_SITE_OTHER): Payer: Medicaid Other | Admitting: Podiatry

## 2022-04-08 ENCOUNTER — Encounter: Payer: Self-pay | Admitting: Podiatry

## 2022-04-08 DIAGNOSIS — M79674 Pain in right toe(s): Secondary | ICD-10-CM

## 2022-04-08 DIAGNOSIS — E1159 Type 2 diabetes mellitus with other circulatory complications: Secondary | ICD-10-CM | POA: Diagnosis not present

## 2022-04-08 DIAGNOSIS — M79675 Pain in left toe(s): Secondary | ICD-10-CM

## 2022-04-08 DIAGNOSIS — B351 Tinea unguium: Secondary | ICD-10-CM

## 2022-04-08 NOTE — Progress Notes (Signed)
This patient returns to my office for at risk foot care.  This patient requires this care by a professional since this patient will be at risk due to having diabetes.   This patient is unable to cut nails himself since the patient cannot reach his nails.These nails are painful walking and wearing shoes.  This patient presents for at risk foot care today. His caregiver states he has developed a painful callus on the inside of left foot. He is brought to the office by caregiver from  [place].  General Appearance  Alert, conversant and in no acute stress.  Vascular  Dorsalis pedis and posterior tibial  pulses are not  palpable  Bilaterally due to swelling..  Capillary return is within normal limits  bilaterally. Temperature is within normal limits  bilaterally.  Neurologic  Senn-Weinstein monofilament wire test within normal limits  bilaterally. Muscle power within normal limits bilaterally.  Nails Thick disfigured discolored nails with subungual debris  from hallux to fifth toes bilaterally. No evidence of bacterial infection or drainage bilaterally.  Orthopedic  No limitations of motion  feet .  No crepitus or effusions noted.  No bony pathology or digital deformities noted.  Skin  normotropic skin with no porokeratosis noted bilaterally.  No signs of infections or ulcers noted.    Onychomycosis  Pain in right toes  Pain in left toes  Consent was obtained for treatment procedures.   Mechanical debridement of nails 1-5  bilaterally performed with a nail nipper.  Filed with dremel without incident.    Return office visit   3 months                 Told patient to return for periodic foot care and evaluation due to potential at risk complications.   Gardiner Barefoot [name]

## 2022-04-10 ENCOUNTER — Ambulatory Visit: Payer: Medicaid Other | Admitting: Cardiology

## 2022-04-14 ENCOUNTER — Other Ambulatory Visit: Payer: Self-pay | Admitting: "Endocrinology

## 2022-04-16 ENCOUNTER — Other Ambulatory Visit: Payer: Self-pay | Admitting: "Endocrinology

## 2022-04-16 NOTE — Telephone Encounter (Signed)
[facility] has called requesting this ASAP since patient is out.  To [facility].

## 2022-04-24 ENCOUNTER — Ambulatory Visit
Payer: Medicaid Other | Attending: Internal Medicine | Admitting: Pharmacist Clinician (PhC)/ Clinical Pharmacy Specialist

## 2022-04-24 ENCOUNTER — Encounter: Payer: Self-pay | Admitting: Pharmacist Clinician (PhC)/ Clinical Pharmacy Specialist

## 2022-04-24 VITALS — BP 96/64 | HR 81

## 2022-04-24 DIAGNOSIS — I1A0 Resistant hypertension: Secondary | ICD-10-CM

## 2022-04-24 MED ORDER — AMLODIPINE BESYLATE 2.5 MG PO TABS: 2.5000 mg | ORAL_TABLET | Freq: Every day | ORAL | 3 refills | Status: DC

## 2022-04-24 NOTE — Progress Notes (Signed)
Office Visit    Patient Name: Melvin Ray Date of Encounter: 04/24/2022  Primary Care Provider:  Leonie Douglas, MD Primary Cardiologist:  Carlyle Dolly, MD  Chief Complaint    Weight and hypertension management  Significant Past Medical History   Obesity BMI 60.5  DM A1c 10.2; lantus 80 units at bedtime, glipizide 10 mg daily, metformin 1000 mg BID  HTN/SVT chlorthalidone 25 mg daily, clonidine 0.3 mg BID, diltiazem 300 mg daily, losartan 100 mg daily, metoprolol succinate 200 mg daily, hydralazine 100 mg TID  OSA BiPAP    No Known Allergies  History of Present Illness    Melvin Ray is a 37 year old male who was referred by Melina Copa, NP to discuss diabetes/weight loss medications. He has a BMI of 60.5 and he also has T2DM with an A1c of 10.2. He presents to the PharmD clinic today with social worker Nickolas Madrid who is his main point of contact. He has not tried any medications for weight loss in the past but has tried lifestyle modifications. He is concerned about the cost of his medications with adding on a new injectable as his current medications are already expensive. He did have a health coach previously and is working to get connected with them again.   At last visit we started him on Ozempic 0.25 mg and he was to work with Education officer, museum on how and when to titrate (Education officer, museum quite familiar with medication).  Since then he was seen by Dr. Oval Linsey, who added amlodipine 5 mg daily to his regimen.  She noted we need to consider switching some of his current medications to more potent ones (metoprolol to carvedilol, losartan to olmesartan/valsartan), however because he gets meds on monthly basis in blister pack, will try to time these changes close to need for refill.    Today he is here to talk about blood pressure.  He states having regular nausea/vomiting with the Ozempic, and has cut his meal size back considerably since starting.  He has now had 2 doses.  Only eats  once meal most days, and finds he cannot even eat all of that.  Does snack on walnuts occasionally.      Adherence: pill packs from [place]   Family Hx: Diabetes in his maternal grandmother; Heart failure in his father and paternal grandmother; Hypertension in his father  Social Hx: smokes cigars, vapes regularly, denies alcohol use  Diet: Likes to eat frozen meals at home, eats a variety of meats (no pork), snacks on walnuts and chips   Exercise: walks on the treadmill for an hour three times per week; hasn't been on recently   Accessory Clinical Findings    Lab Results  Component Value Date   CREATININE 1.11 01/02/2022   BUN 16 01/02/2022   NA 141 01/02/2022   K 3.2 (L) 01/02/2022   CL 103 01/02/2022   CO2 28 01/02/2022   Lab Results  Component Value Date   ALT 19 05/09/2021   AST 3 (A) 05/09/2021   ALKPHOS 100 05/09/2021   Lab Results  Component Value Date   HGBA1C 10.2 07/23/2021     Home Medications/Allergies    Current Outpatient Medications  Medication Sig Dispense Refill   ACCU-CHEK GUIDE test strip 3 (three) times daily.     Accu-Chek Softclix Lancets lancets 3 (three) times daily.     albuterol (VENTOLIN HFA) 108 (90 Base) MCG/ACT inhaler Inhale 1-2 puffs into the lungs every 6 (  six) hours as needed for wheezing or shortness of breath.     amLODipine (NORVASC) 2.5 MG tablet Take 1 tablet (2.5 mg total) by mouth daily. 90 tablet 3   chlorthalidone (HYGROTON) 25 MG tablet TAKE 1 TABLET ONCE DAILY. 90 tablet 3   Cholecalciferol (VITAMIN D3) 50 MCG (2000 UT) TABS Take 2,000 Units by mouth daily.     cloNIDine (CATAPRES) 0.3 MG tablet Take 0.3 mg by mouth 3 (three) times daily.     diltiazem (CARDIZEM CD) 300 MG 24 hr capsule Take 300 mg by mouth daily.     FEROSUL 325 (65 Fe) MG tablet Take 325 mg by mouth daily.     FLUoxetine (PROZAC) 20 MG capsule Take 20 mg by mouth daily.     Fluticasone-Salmeterol (ADVAIR) 250-50 MCG/DOSE AEPB Inhale 1  puff into the lungs daily.     hydrALAZINE (APRESOLINE) 100 MG tablet Take 100 mg by mouth 3 (three) times daily.     LANTUS SOLOSTAR 100 UNIT/ML Solostar Pen INJECT 80 UNITS INTO THE SKIN AT BEDTIME. 21 mL 0   losartan (COZAAR) 100 MG tablet Take 100 mg by mouth at bedtime.     metFORMIN (GLUCOPHAGE-XR) 500 MG 24 hr tablet Take 1,000 mg by mouth 2 (two) times daily with breakfast and lunch.     metoprolol (TOPROL-XL) 200 MG 24 hr tablet Take 200 mg by mouth daily.     NON FORMULARY Pt uses a cpap nightly     pantoprazole (PROTONIX) 40 MG tablet Take 40 mg by mouth daily.     potassium chloride (KLOR-CON) 10 MEQ tablet Take 10 mEq by mouth daily.     Semaglutide,0.25 or 0.'5MG'$ /DOS, 2 MG/3ML SOPN Inject 0.25 mg into the skin every 7 (seven) days. 3 mL 1   traZODone (DESYREL) 100 MG tablet Take 200 mg by mouth at bedtime.      No current facility-administered medications for this visit.     No Known Allergies  Assessment & Plan    Resistant hypertension Assessment: BP is controlled in office BP 96/64 mmHg; Having some symptoms of dizziness and weakness  Denies SOB, palpitation, chest pain, headaches,or swelling Reiterated the importance of regular exercise and low salt diet   Plan:  Continue taking current medications for now.  We will arrange for your next monthly pill box to decrease the amlodipine to 2.5 mg daily instead of 5 mg.   Patient to keep record of BP readings with heart rate and report to [place] at the next visit Patient to follow up with PharmD in 1 month for follow up - regarding Ozempic use  Labs ordered today:  none   Tommy Medal PharmD CPP [place]  [address] [place], [place] [address] [phone]

## 2022-04-24 NOTE — Patient Instructions (Signed)
Follow up appointment: April 8 with Pharmacy team at [facility] office at 1:30 pm  Take your BP meds as follows:  Continue with your current medications.  Drink plenty of water and use a few saltine crackers if you feel particularly weak or lightheaded.    Next month your medication pack will have a lower dose of amlodipine and that should help you to feel better.   Continue the Ozempic at 0.25 mg for at least 2 more weeks, but can go until your next appointment if the nausea/vomiting continues.   Hypertension "High blood pressure"  Hypertension is often called "The Silent Killer." It rarely causes symptoms until it is extremely  high or has done damage to other organs in the body. For this reason, you should have your  blood pressure checked regularly by your physician. We will check your blood pressure  every time you see a provider at one of our offices.   Your blood pressure reading consists of two numbers. Ideally, blood pressure should be  below 120/80. The first ("top") number is called the systolic pressure. It measures the  pressure in your arteries as your heart beats. The second ("bottom") number is called the diastolic pressure. It measures the pressure in your arteries as the heart relaxes between beats.  The benefits of getting your blood pressure under control are enormous. A 10-point  reduction in systolic blood pressure can reduce your risk of stroke by 27% and heart failure by 28%  Your blood pressure goal is <130/80  To check your pressure at home you will need to:  1. Sit up in a chair, with feet flat on the floor and back supported. Do not cross your ankles or legs. 2. Rest your left arm so that the cuff is about heart level. If the cuff goes on your upper arm,  then just relax the arm on the table, arm of the chair or your lap. If you have a wrist cuff, we  suggest relaxing your wrist against your chest (think of it as Pledging the Flag with the  wrong arm).   3. Place the cuff snugly around your arm, about 1 inch above the crook of your elbow. The  cords should be inside the groove of your elbow.  4. Sit quietly, with the cuff in place, for about 5 minutes. After that 5 minutes press the power  button to start a reading. 5. Do not talk or move while the reading is taking place.  6. Record your readings on a sheet of paper. Although most cuffs have a memory, it is often  easier to see a pattern developing when the numbers are all in front of you.  7. You can repeat the reading after 1-3 minutes if it is recommended  Make sure your bladder is empty and you have not had caffeine or tobacco within the last 30 min  Always bring your blood pressure log with you to your appointments. If you have not brought your monitor in to be double checked for accuracy, please bring it to your next appointment.  You can find a list of quality blood pressure cuffs at validatebp.org

## 2022-04-24 NOTE — Assessment & Plan Note (Signed)
Assessment: BP is controlled in office BP 96/64 mmHg; Having some symptoms of dizziness and weakness  Denies SOB, palpitation, chest pain, headaches,or swelling Reiterated the importance of regular exercise and low salt diet   Plan:  Continue taking current medications for now.  We will arrange for your next monthly pill box to decrease the amlodipine to 2.5 mg daily instead of 5 mg.   Patient to keep record of BP readings with heart rate and report to [place] at the next visit Patient to follow up with PharmD in 1 month for follow up - regarding Ozempic use  Labs ordered today:  none

## 2022-04-30 ENCOUNTER — Ambulatory Visit: Payer: Medicaid Other | Admitting: "Endocrinology

## 2022-05-08 ENCOUNTER — Other Ambulatory Visit (HOSPITAL_BASED_OUTPATIENT_CLINIC_OR_DEPARTMENT_OTHER): Payer: Self-pay | Admitting: Internal Medicine

## 2022-05-13 ENCOUNTER — Other Ambulatory Visit (HOSPITAL_BASED_OUTPATIENT_CLINIC_OR_DEPARTMENT_OTHER): Payer: Self-pay | Admitting: Cardiovascular Disease

## 2022-05-13 NOTE — Telephone Encounter (Signed)
Rx request sent to pharmacy.  

## 2022-05-15 ENCOUNTER — Other Ambulatory Visit (HOSPITAL_BASED_OUTPATIENT_CLINIC_OR_DEPARTMENT_OTHER): Payer: Self-pay | Admitting: Cardiovascular Disease

## 2022-05-15 DIAGNOSIS — N182 Chronic kidney disease, stage 2 (mild): Secondary | ICD-10-CM

## 2022-05-15 LAB — ALDOSTERONE + RENIN ACTIVITY W/ RATIO
Aldos/Renin Ratio: 16.7 (ref 0.0–30.0)
Aldosterone: 5 ng/dL (ref 0.0–30.0)
Renin Activity, Plasma: 0.3 ng/mL/hr (ref 0.167–5.380)

## 2022-05-15 LAB — BASIC METABOLIC PANEL
BUN/Creatinine Ratio: 14 (ref 9–20)
BUN: 16 mg/dL (ref 6–20)
CO2: 25 mmol/L (ref 20–29)
Calcium: 9.7 mg/dL (ref 8.7–10.2)
Chloride: 98 mmol/L (ref 96–106)
Creatinine, Ser: 1.17 mg/dL (ref 0.76–1.27)
Glucose: 177 mg/dL — ABNORMAL HIGH (ref 70–99)
Potassium: 4.1 mmol/L (ref 3.5–5.2)
Sodium: 141 mmol/L (ref 134–144)
eGFR: 83 mL/min/{1.73_m2} (ref >59)

## 2022-05-15 LAB — SPECIMEN STATUS REPORT

## 2022-05-15 LAB — MAGNESIUM: Magnesium: 1.6 mg/dL (ref 1.6–2.3)

## 2022-06-02 ENCOUNTER — Encounter: Payer: Self-pay | Admitting: Pharmacist Clinician (PhC)/ Clinical Pharmacy Specialist

## 2022-06-02 ENCOUNTER — Ambulatory Visit
Payer: Medicaid Other | Attending: Internal Medicine | Admitting: Pharmacist Clinician (PhC)/ Clinical Pharmacy Specialist

## 2022-06-02 MED ORDER — OZEMPIC (2 MG/DOSE) 8 MG/3ML ~~LOC~~ SOPN: 2.0000 mg | PEN_INJECTOR | SUBCUTANEOUS | 1 refills | Status: DC

## 2022-06-02 MED ORDER — OZEMPIC (1 MG/DOSE) 2 MG/1.5ML ~~LOC~~ SOPN: 1.0000 mg | PEN_INJECTOR | SUBCUTANEOUS | 0 refills | Status: DC

## 2022-06-02 NOTE — Progress Notes (Signed)
Office Visit    Patient Name: Melvin Ray Date of Encounter: 06/02/2022  Primary Care Provider:  Waldon Reining, MD Primary Cardiologist:  Dina Rich, MD  Chief Complaint    Weight management  Significant Past Medical History   Obesity BMI 60.5  DM A1c 10.2; lantus 80 units at bedtime, glipizide 10 mg daily, metformin 1000 mg BID  HTN/SVT chlorthalidone 25 mg daily, clonidine 0.3 mg BID, diltiazem 300 mg daily, losartan 100 mg daily, metoprolol succinate 200 mg daily, hydralazine 100 mg TID  OSA BiPAP    No Known Allergies  History of Present Illness    Melvin Ray is a 37 year old male who was referred by Ronie Spies, NP to discuss diabetes/weight loss medications. He has a BMI of 60.5 and he also has T2DM with an A1c of 10.2. He presents to the PharmD clinic today with social worker Vilinda Blanks who is his main point of contact. He has not tried any medications for weight loss in the past but has tried lifestyle modifications. He is concerned about the cost of his medications with adding on a new injectable as his current medications are already expensive.   Today he returns for follow up.  He was on the 0.25 mg Ozempic dose for 2 months, as it did cause some nausea to start with. This past week he started the 0.5 mg dose and reported the first dose was fine.  He is eating less overall, however his meal choices are still limited, mostly to processed or pre-packaged meals.    Confirmed patient has no personal or family history of medullary thyroid carcinoma (MTC) or Multiple Endocrine Neoplasia syndrome type 2 (MEN 2).  Start weight/BMI  202.395 kg  BMI 60.5 Today weight/BMI  190.783 kg  BMI 57.03 % weight loss 5.7%  Adherence: pill packs from [facility]   Family Hx: Diabetes in his maternal grandmother; Heart failure in his father and paternal grandmother; Hypertension in his father  Social Hx: smokes cigars, vapes regularly, denies alcohol use  Diet:  Likes to eat frozen meals at home, eats a variety of meats (no pork), snacks on walnuts and chips   Usually eats 2 meals per day, 4-5 in the afternoon then "1 hour before bed", went to bed at 5 am this am, so ate at 4 am (Cup of Noodles and gatorade)  Exercise: walks on the treadmill for an hour three times per week   Accessory Clinical Findings    Lab Results  Component Value Date   CREATININE 1.17 05/08/2022   BUN 16 05/08/2022   NA 141 05/08/2022   K 4.1 05/08/2022   CL 98 05/08/2022   CO2 25 05/08/2022   Lab Results  Component Value Date   ALT 19 05/09/2021   AST 3 (A) 05/09/2021   ALKPHOS 100 05/09/2021   Lab Results  Component Value Date   HGBA1C 10.2 07/23/2021      Home Medications/Allergies    Current Outpatient Medications  Medication Sig Dispense Refill   Semaglutide, 1 MG/DOSE, (OZEMPIC, 1 MG/DOSE,) 2 MG/1.5ML SOPN Inject 1 mg into the skin every 7 (seven) days. 1.5 mL 0   Semaglutide, 2 MG/DOSE, (OZEMPIC, 2 MG/DOSE,) 8 MG/3ML SOPN Inject 2 mg into the skin every 7 (seven) days. 3 mL 1   ACCU-CHEK GUIDE test strip 3 (three) times daily.     Accu-Chek Softclix Lancets lancets 3 (three) times daily.     albuterol (VENTOLIN HFA) 108 (90  Base) MCG/ACT inhaler Inhale 1-2 puffs into the lungs every 6 (six) hours as needed for wheezing or shortness of breath.     amLODipine (NORVASC) 2.5 MG tablet Take 1 tablet (2.5 mg total) by mouth daily. 90 tablet 3   chlorthalidone (HYGROTON) 25 MG tablet TAKE 1 TABLET ONCE DAILY. 90 tablet 1   Cholecalciferol (VITAMIN D3) 50 MCG (2000 UT) TABS Take 2,000 Units by mouth daily.     cloNIDine (CATAPRES) 0.3 MG tablet Take 0.3 mg by mouth 3 (three) times daily.     diltiazem (CARDIZEM CD) 300 MG 24 hr capsule Take 300 mg by mouth daily.     FEROSUL 325 (65 Fe) MG tablet Take 325 mg by mouth daily.     FLUoxetine (PROZAC) 20 MG capsule Take 20 mg by mouth daily.     Fluticasone-Salmeterol (ADVAIR) 250-50 MCG/DOSE AEPB Inhale 1 puff  into the lungs daily.     hydrALAZINE (APRESOLINE) 100 MG tablet Take 100 mg by mouth 3 (three) times daily.     LANTUS SOLOSTAR 100 UNIT/ML Solostar Pen INJECT 80 UNITS INTO THE SKIN AT BEDTIME. 21 mL 0   losartan (COZAAR) 100 MG tablet Take 100 mg by mouth at bedtime.     metFORMIN (GLUCOPHAGE-XR) 500 MG 24 hr tablet Take 1,000 mg by mouth 2 (two) times daily with breakfast and lunch.     metoprolol (TOPROL-XL) 200 MG 24 hr tablet Take 200 mg by mouth daily.     NON FORMULARY Pt uses a cpap nightly     pantoprazole (PROTONIX) 40 MG tablet Take 40 mg by mouth daily.     potassium chloride (KLOR-CON) 10 MEQ tablet Take 10 mEq by mouth daily.     Semaglutide,0.25 or 0.5MG /DOS, (OZEMPIC, 0.25 OR 0.5 MG/DOSE,) 2 MG/3ML SOPN Inject 0.5 mg into the skin once a week. 3 mL 0   traZODone (DESYREL) 100 MG tablet Take 200 mg by mouth at bedtime.      No current facility-administered medications for this visit.     No Known Allergies  Assessment & Plan    Morbid obesity [facility]) Mr Shawhan has now been on Ozempic for 9 week, 8 of which were at the 0.25 mg dose.  He has lost 5.7% of his starting body weight, and is currently at 190.783 kg.    Reviewed common side effects including nausea, diarrhea, dyspepsia, decreased appetite, and fatigue. Counseled patient on reducing meal size and how to titrate medication to minimize side effects. Patient aware to call if intolerable side effects or if experiencing dehydration, abdominal pain, or dizziness. Patient will adhere to dietary modifications and will target at least 150 minutes of moderate intensity exercise weekly.   Titration Plan:  Will plan to follow the titration plan as below, pending patient is tolerating each dose before increasing to the next. Can slow titration if needed for tolerability.     -Month 3: Inject 0.5 mg SQ once weekly x 4 weeks -Month 4: Inject 1 mg SQ once weekly  4 weeks -Month 5: Inject 2 mg SQ once weekly  Follow up in 3 months.    Phillips Hay PharmD CPP [facility]  [address] [place], [place] [address] [phone]

## 2022-06-02 NOTE — Assessment & Plan Note (Signed)
Melvin Ray has now been on Ozempic for 9 week, 8 of which were at the 0.25 mg dose.  He has lost 5.7% of his starting body weight, and is currently at 190.783 kg.    Reviewed common side effects including nausea, diarrhea, dyspepsia, decreased appetite, and fatigue. Counseled patient on reducing meal size and how to titrate medication to minimize side effects. Patient aware to call if intolerable side effects or if experiencing dehydration, abdominal pain, or dizziness. Patient will adhere to dietary modifications and will target at least 150 minutes of moderate intensity exercise weekly.   Titration Plan:  Will plan to follow the titration plan as below, pending patient is tolerating each dose before increasing to the next. Can slow titration if needed for tolerability.     -Month 3: Inject 0.5 mg SQ once weekly x 4 weeks -Month 4: Inject 1 mg SQ once weekly  4 weeks -Month 5: Inject 2 mg SQ once weekly  Follow up in 3 months.

## 2022-06-02 NOTE — Patient Instructions (Signed)
  TIPS FOR SUCCESS Write down the reasons why you want to lose weight and post it in a place where you'll see it often. Start small and work your way up. Keep in mind that it takes time to achieve goals, and small steps add up. Any additional movements help to burn calories. Taking the stairs rather than the elevator and parking at the far end of your parking lot are easy ways to start. Brisk walking for at least 30 minutes 4 or more days of the week is an excellent goal to work toward  [employer] WHAT IT MEANS TO FEEL FULL Did you know that it can take 15 minutes or more for your brain to receive the message that you've eaten? That means that, if you eat less food, but consume it slower, you may still feel satisfied. Eating a lot of fruits and vegetables can also help you feel fuller. Eat off of smaller plates so that moderate portions don't seem too small  TITRATION PLAN Will plan to follow the titration plan as below, pending patient is tolerating each dose before increasing to the next. Can slow titration if needed for tolerability.    -Weeks 5-8: Inject 0.5 mg SQ once weekly  -Weeks 9-12 Inject 1 mg SQ once weekly  -Weeks 13-16: Inject 2 mg SQ once weekly   Follow up in 3 months.  If you have any questions or concerns, please reach out to [place].  Gervase Colberg/Chris at [phone].  THANK YOU FOR CHOOSING [facility]

## 2022-06-02 NOTE — Progress Notes (Signed)
Patient met with Pharmacy today and expressed interest in participating in the health coaching program. Patient was scheduled for an initial session over the phone on 4/11 at 3:00pm. Patient will be called at this time.    Renaee Munda, MS, ERHD, [facility]  Care Guide, Health & Wellness Coach [address] [place] [address] Telephone: [phone] Email: [email or web address]

## 2022-06-03 ENCOUNTER — Other Ambulatory Visit: Payer: Self-pay | Admitting: Pharmacist

## 2022-06-03 DIAGNOSIS — E1122 Type 2 diabetes mellitus with diabetic chronic kidney disease: Secondary | ICD-10-CM

## 2022-06-03 MED ORDER — OZEMPIC (1 MG/DOSE) 4 MG/3ML ~~LOC~~ SOPN
1.0000 mg | PEN_INJECTOR | SUBCUTANEOUS | 0 refills | Status: DC
Start: 2022-06-03 — End: 2022-08-07

## 2022-06-03 NOTE — Telephone Encounter (Signed)
Received fax from [facility] to change quanitity of Ozempic 1.0mg  to 18ml pen

## 2022-06-05 ENCOUNTER — Ambulatory Visit: Payer: Medicaid Other | Attending: Cardiology

## 2022-06-05 DIAGNOSIS — Z0000 Encounter for general adult medical examination without abnormal findings: Secondary | ICD-10-CM

## 2022-06-05 NOTE — Progress Notes (Signed)
Appointment Outcome: Completed, Session #: Initial                         Start time: 3:01pm   End time: 3:31pm  Total Mins: 30 minutes  AGREEMENTS SECTION   Overall Goal(s): Improve healthy eating habits Increase physical activity                                             Agreement/Action Steps:  Improve healthy eating habits Eat no later than 8pm nightly Replace noodles and chips with fruits/vegetables as snacks Continue to read food labels to monitor sodium and sugar Continue to drink water instead of soda  Increase physical activity Walk to convenient store near home at least 2 days a week Interested in participating in PREP or getting a [employer]    Progress Notes:  Patient stated that he wants to improve his blood sugar levels and blood pressure by changing his eating habits and exercising again. Patient reported that he has lost weight taking Ozempic for diabetes, which decreased his appetite. Patient stated that he hasn't changed what he eats but eats less, and only twice a day. Patient shared that he wants to replace junk food with healthier choices and decided that he would like to try fruits and vegetables as snacks. Patient stated that he like carrots and apples, and he eat salads.   Patient stated that he recently went grocery shopping but did not purchase those items. Patient decided that when he goes grocery shopping next week that he will replace chips and noodles with fruits and vegetables. Patient stated that he read food labels to monitor his sodium and sugar intake. Patient expressed interest in talking with a nutritionist to learn more about what to eat to manage his blood sugar and blood pressure.   Patient mentioned that he was exercising at the [facility] a couple of months ago but stopped because his health insurance discontinued coverage of his gym membership. Patient stated that during the time frame that he was going to the gym he would exercise 3 days a  week for one hour. Patient shared that he walked on the treadmill and lifted weights.   Patient stated that his current exercise is walking to the convenient store near his home a couple days a week. Discussed with patient the idea of exercising at home and asked what things he could do at home to increase his physical activity. Patient declined the idea of exercising at home and stated that he prefers participating in a program such as PREP again or go to the gym.    Coaching Outcomes:  Reviewed Coaching Agreement and Code of Ethics with Patient during initial session. Provided patient with a hard/electronic copy of the Coaching Agreement and Code of Ethics. Answered any questions the patient had if any regarding the Coaching Agreement and Code of Ethics. Patient verbally agreed to adhere to the Coaching Agreement and to abide by the Code of Ethics.  Emailed patient an outline of action steps discussed. Mailed goal tracking sheets, information on diabetic plate method, meal planning sheets, sodium tracker sheets, and [employer] membership assistance application.

## 2022-06-06 ENCOUNTER — Telehealth (HOSPITAL_BASED_OUTPATIENT_CLINIC_OR_DEPARTMENT_OTHER): Payer: Self-pay | Admitting: *Deleted

## 2022-06-06 DIAGNOSIS — R7309 Other abnormal glucose: Secondary | ICD-10-CM

## 2022-06-06 DIAGNOSIS — I10 Essential (primary) hypertension: Secondary | ICD-10-CM

## 2022-06-06 DIAGNOSIS — E1122 Type 2 diabetes mellitus with diabetic chronic kidney disease: Secondary | ICD-10-CM

## 2022-06-06 NOTE — Telephone Encounter (Signed)
-----   Message from Rosalee Kaufman, RPH-CPP sent at 06/05/2022  4:30 PM EDT ----- Regarding: FW: Nutritionist Referral Could you put in the referral.  He is obese with hypertension and DM, hopefully it can be covered.  Thank you! Belenda Cruise ----- Message ----- From: Renaee Munda Sent: 06/05/2022   4:24 PM EDT To: Rosalee Kaufman, RPH-CPP Subject: Nutritionist Referral                          Hi Belenda Cruise,  I spoke with Fayrene Fearing today for our initial health coaching session. He is interested in talking with a nutritionist about what types of foods he should be eating to manage his diabetes and hypertension. Will you submit a referral for him? I'm not able to do it because I'm not a provider and the last time I did I was told they weren't able to be paid for the services rendered to the patient I referred. In the meantime, I sent him information on the diabetic plate method for now.  Thanks, Amy

## 2022-06-06 NOTE — Telephone Encounter (Signed)
Order placed as requested.

## 2022-06-19 ENCOUNTER — Telehealth: Payer: Self-pay

## 2022-06-19 ENCOUNTER — Ambulatory Visit: Payer: Medicaid Other

## 2022-06-19 DIAGNOSIS — Z0000 Encounter for general adult medical examination without abnormal findings: Secondary | ICD-10-CM

## 2022-06-19 NOTE — Telephone Encounter (Signed)
Called patient for telephonic health coaching. Patient did not answer. Was not able to leave message for either missed called because voicemail is not setup.   Renaee Munda, MS, ERHD, [facility]  Care Guide, Health & Wellness Coach [address] [place] [address] Telephone: [phone] Email: [email or web address]

## 2022-06-25 ENCOUNTER — Encounter: Payer: Self-pay | Admitting: "Endocrinology

## 2022-06-25 ENCOUNTER — Ambulatory Visit (INDEPENDENT_AMBULATORY_CARE_PROVIDER_SITE_OTHER): Payer: Medicaid Other | Admitting: "Endocrinology

## 2022-06-25 VITALS — BP 126/84 | HR 72 | Ht 72.0 in | Wt >= 6400 oz

## 2022-06-25 DIAGNOSIS — I10 Essential (primary) hypertension: Secondary | ICD-10-CM | POA: Diagnosis not present

## 2022-06-25 DIAGNOSIS — Z794 Long term (current) use of insulin: Secondary | ICD-10-CM

## 2022-06-25 DIAGNOSIS — E1122 Type 2 diabetes mellitus with diabetic chronic kidney disease: Secondary | ICD-10-CM

## 2022-06-25 DIAGNOSIS — E782 Mixed hyperlipidemia: Secondary | ICD-10-CM

## 2022-06-25 DIAGNOSIS — N182 Chronic kidney disease, stage 2 (mild): Secondary | ICD-10-CM | POA: Diagnosis not present

## 2022-06-25 LAB — POCT GLYCOSYLATED HEMOGLOBIN (HGB A1C): HbA1c, POC (controlled diabetic range): 7.4 % — AB (ref 0.0–7.0)

## 2022-06-25 MED ORDER — LANTUS SOLOSTAR 100 UNIT/ML ~~LOC~~ SOPN: 50.0000 [IU] | PEN_INJECTOR | Freq: Every day | SUBCUTANEOUS | 1 refills | Status: DC

## 2022-06-25 NOTE — Progress Notes (Unsigned)
06/25/2022, 4:18 PM  Endocrinology follow-up note   Subjective:    Patient ID: Melvin Ray, male    DOB: 10-26-1985.  Melvin Ray is being seen in follow up after he was sen in consultation for management of currently uncontrolled symptomatic diabetes requested by  Waldon Reining, MD. He is assisted and accompanied by his case manager Johnny.   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   SVT (supraventricular tachycardia)    Tobacco abuse     Past Surgical History:  Procedure Laterality Date   APPENDECTOMY     BIOPSY  01/07/2022   Procedure: BIOPSY;  Surgeon: Dolores Frame, MD;  [facility];  Service: Gastroenterology;;   COLONOSCOPY WITH PROPOFOL N/A 01/07/2022   Procedure: COLONOSCOPY WITH PROPOFOL;  Surgeon: Dolores Frame, MD;  [facility]: [facility];  Service: Gastroenterology;  Laterality: N/A;  1030 ASA 3   ESOPHAGOGASTRODUODENOSCOPY  01/07/2022   Procedure: ESOPHAGOGASTRODUODENOSCOPY (EGD);  Surgeon: Marguerita Merles, Reuel Boom, MD;  Location: [facility];  Service: Gastroenterology;;   GIVENS CAPSULE STUDY N/A 02/05/2022   Procedure: GIVENS CAPSULE STUDY;  Surgeon: Dolores Frame, MD;  [facility]: [facility];  Service: Gastroenterology;  Laterality: N/A;  8:30am    Social History   Socioeconomic History   Marital status: Single    Spouse name: Not on file   Number of children: Not on file   Years of education: Not on file   Highest education level: Not on file  Occupational History   Not on file  Tobacco Use   Smoking status: Some Days    Types: Cigars    Passive exposure: Current   Smokeless tobacco: Former  Building services engineer  Use: Every day  Substance and Sexual Activity   Alcohol use: Never   Drug use: Never   Sexual activity: Not on file  Other Topics Concern   Not on file  Social History Narrative   Not on file   Social Determinants of Health   Financial Resource Strain: Medium Risk (03/24/2022)   Overall Financial Resource Strain (CARDIA)    Difficulty of Paying Living Expenses: Somewhat hard  Food Insecurity: No Food Insecurity (03/24/2022)   Hunger Vital Sign    Worried About Running Out of Food in the Last Year: Never true    Ran Out of Food in the Last Year: Never true  Transportation Needs: No Transportation Needs (03/24/2022)   PRAPARE - Administrator, Civil Service (Medical): No    Lack of Transportation (Non-Medical): No  Physical Activity: Insufficiently Active (07/31/2020)   Exercise Vital Sign    Days of Exercise per Week: 3 days    Minutes of Exercise per Session: 40 min  Stress: Not on file  Social Connections: Not on file    Family History  Problem Relation Age of Onset   Heart  failure Father    Hypertension Father    Diabetes Maternal Grandmother    Heart failure Paternal Grandmother        transplant    Outpatient Encounter Medications as of 06/25/2022  Medication Sig   ACCU-CHEK GUIDE test strip 3 (three) times daily.   Accu-Chek Softclix Lancets lancets 3 (three) times daily.   albuterol (VENTOLIN HFA) 108 (90 Base) MCG/ACT inhaler Inhale 1-2 puffs into the lungs every 6 (six) hours as needed for wheezing or shortness of breath.   amLODipine (NORVASC) 2.5 MG tablet Take 1 tablet (2.5 mg total) by mouth daily. (Patient taking differently: Take 5 mg by mouth daily.)   chlorthalidone (HYGROTON) 25 MG tablet TAKE 1 TABLET ONCE DAILY.   Cholecalciferol (VITAMIN D3) 50 MCG (2000 UT) TABS Take 2,000 Units by mouth daily.   cloNIDine (CATAPRES) 0.3 MG tablet Take 0.3 mg by mouth 3 (three) times daily.   diltiazem (CARDIZEM CD) 300 MG 24 hr capsule Take 300 mg by mouth  daily.   FEROSUL 325 (65 Fe) MG tablet Take 325 mg by mouth daily.   FLUoxetine (PROZAC) 20 MG capsule Take 20 mg by mouth daily.   Fluticasone-Salmeterol (ADVAIR) 250-50 MCG/DOSE AEPB Inhale 1 puff into the lungs daily.   hydrALAZINE (APRESOLINE) 100 MG tablet Take 100 mg by mouth 3 (three) times daily.   insulin glargine (LANTUS SOLOSTAR) 100 UNIT/ML Solostar Pen Inject 50 Units into the skin at bedtime.   losartan (COZAAR) 100 MG tablet Take 100 mg by mouth at bedtime.   metFORMIN (GLUCOPHAGE-XR) 500 MG 24 hr tablet Take 1,000 mg by mouth 2 (two) times daily with breakfast and lunch.   metoprolol (TOPROL-XL) 200 MG 24 hr tablet Take 200 mg by mouth daily.   NON FORMULARY Pt uses a cpap nightly   pantoprazole (PROTONIX) 40 MG tablet Take 40 mg by mouth daily.   potassium chloride (KLOR-CON) 10 MEQ tablet Take 10 mEq by mouth daily.   Semaglutide, 1 MG/DOSE, (OZEMPIC, 1 MG/DOSE,) 4 MG/3ML SOPN Inject 1 mg into the skin once a week.   Semaglutide, 2 MG/DOSE, (OZEMPIC, 2 MG/DOSE,) 8 MG/3ML SOPN Inject 2 mg into the skin every 7 (seven) days.   traZODone (DESYREL) 100 MG tablet Take 200 mg by mouth at bedtime.    [DISCONTINUED] LANTUS SOLOSTAR 100 UNIT/ML Solostar Pen INJECT 80 UNITS INTO THE SKIN AT BEDTIME. (Patient taking differently: Inject 50-80 Units into the skin at bedtime.)   [DISCONTINUED] Semaglutide,0.25 or 0.5MG /DOS, (OZEMPIC, 0.25 OR 0.5 MG/DOSE,) 2 MG/3ML SOPN Inject 0.5 mg into the skin once a week.   No facility-administered encounter medications on file as of 06/25/2022.    ALLERGIES: No Known Allergies  VACCINATION STATUS: Immunization History  Administered Date(s) Administered   Influenza Split 02/15/2019   Tdap 07/16/2015    Diabetes He presents for his follow-up diabetic visit. He has type 2 diabetes mellitus. Onset time: He was diagnosed through approximate age of 31 years. His disease course has been worsening. There are no hypoglycemic associated symptoms.  Pertinent negatives for hypoglycemia include no headaches, seizures or tremors. Pertinent negatives for diabetes include no blurred vision, no chest pain, no polydipsia, no polyuria and no weight loss. There are no hypoglycemic complications. Symptoms are worsening. Diabetic complications include nephropathy. Risk factors for coronary artery disease include dyslipidemia, diabetes mellitus, family history, male sex, obesity, hypertension, tobacco exposure and sedentary lifestyle. Current diabetic treatment includes insulin injections. His weight is fluctuating minimally. He is following a generally unhealthy diet.  When asked about meal planning, he reported none. He has not had a previous visit with a dietitian. He never participates in exercise. His home blood glucose trend is increasing steadily. His overall blood glucose range is 140-180 mg/dl. Fayrene Fearing presents with incomplete monitoring of blood glucose.  He presents with a meter showing less than 1 readings per day on average.  He has 10 readings the last 14 days averaging 175, at fasting.  His recent labs show A1c of 10.2%, increasing from 8.6%.  He did not document any hypoglycemia.     ) An ACE inhibitor/angiotensin II receptor blocker is being taken. He does not see a podiatrist.Eye exam is not current.  Hyperlipidemia This is a chronic problem. The current episode started more than 1 year ago. Exacerbating diseases include chronic renal disease, diabetes and obesity. Pertinent negatives include no chest pain, myalgias or shortness of breath. Risk factors for coronary artery disease include dyslipidemia, diabetes mellitus, family history, obesity, male sex, hypertension and a sedentary lifestyle.  Hypertension This is a chronic problem. The current episode started more than 1 year ago. Pertinent negatives include no blurred vision, chest pain, headaches, palpitations or shortness of breath. Risk factors for coronary artery disease include dyslipidemia,  diabetes mellitus, family history, obesity, male gender, sedentary lifestyle and smoking/tobacco exposure. Past treatments include central alpha agonists and angiotensin blockers. Identifiable causes of hypertension include chronic renal disease.     Review of Systems  Constitutional:  Negative for chills, fever and weight loss.  Eyes:  Negative for blurred vision.  Respiratory:  Negative for cough and shortness of breath.   Cardiovascular:  Negative for chest pain and palpitations.       No Shortness of breath  Gastrointestinal:  Negative for abdominal pain, diarrhea, nausea and vomiting.  Endocrine: Negative for polydipsia and polyuria.  Genitourinary:  Negative for frequency, hematuria and urgency.  Musculoskeletal:  Negative for myalgias.  Skin:  Negative for rash.  Neurological:  Negative for tremors, seizures and headaches.  Hematological:  Does not bruise/bleed easily.  Psychiatric/Behavioral:  Negative for hallucinations and suicidal ideas.     Objective:       06/25/2022   10:07 AM 06/02/2022    1:25 PM 04/24/2022    1:44 PM  Vitals with BMI  Height 6\' 0"  6\' 0"    Weight 415 lbs 420 lbs 10 oz   BMI 56.27 57.03   Systolic 126 121 96  Diastolic 84 77 64  Pulse 72  81    BP 126/84   Pulse 72   Ht 6' (1.829 m)   Wt (!) 415 lb (188.2 kg)   BMI 56.28 kg/m   Wt Readings from Last 3 Encounters:  06/25/22 (!) 415 lb (188.2 kg)  06/02/22 (!) 420 lb 9.6 oz (190.8 kg)  03/24/22 (!) 440 lb 9.6 oz (199.9 kg)       Diabetic Labs (most recent): Lab Results  Component Value Date   HGBA1C 7.4 (A) 06/25/2022   HGBA1C 10.2 07/23/2021   HGBA1C 8.6 (A) 05/14/2021   MICROALBUR 7.8 07/23/2021      Recent Results (from the past 2160 hour(s))  Basic metabolic panel     Status: Abnormal   Collection Time: 05/08/22  3:41 PM  Result Value Ref Range   Glucose 177 (H) 70 - 99 mg/dL   BUN 16 6 - 20 mg/dL   Creatinine, Ser 1.61 0.76 - 1.27 mg/dL   eGFR 83 >09 UE/AVW/0.98    BUN/Creatinine Ratio 14 9 -  20   Sodium 141 134 - 144 mmol/L   Potassium 4.1 3.5 - 5.2 mmol/L   Chloride 98 96 - 106 mmol/L   CO2 25 20 - 29 mmol/L   Calcium 9.7 8.7 - 10.2 mg/dL  Aldosterone + renin activity w/ ratio     Status: None   Collection Time: 05/08/22  3:41 PM  Result Value Ref Range   Aldosterone 5.0 0.0 - 30.0 ng/dL   Renin Activity, Plasma 0.300 0.167 - 5.380 ng/mL/hr   Aldos/Renin Ratio 16.7 0.0 - 30.0    Comment:                          Units:      ng/dL per ng/mL/hr  Magnesium     Status: None   Collection Time: 05/08/22  3:41 PM  Result Value Ref Range   Magnesium 1.6 1.6 - 2.3 mg/dL  Specimen status report     Status: None   Collection Time: 05/08/22  3:41 PM  Result Value Ref Range   specimen status report Comment     Comment: Gloris [place] BMP8 Default Ambig Abbrev BMP8 Default A hand-written panel/profile was received from your office. In accordance with the [facility] Ambiguous Test Code Policy dated July 2003, we have completed your order by using the closest currently or formerly recognized AMA panel.  We have assigned Basic Metabolic Panel (8), Test Code #161096 to this request. If this is not the testing you wished to receive on this specimen, please contact the [facility] Client Inquiry/Technical Services Department to clarify the test order.  We appreciate your business.   HgB A1c     Status: Abnormal   Collection Time: 06/25/22 10:18 AM  Result Value Ref Range   Hemoglobin A1C     HbA1c POC (<> result, manual entry)     HbA1c, POC (prediabetic range)     HbA1c, POC (controlled diabetic range) 7.4 (A) 0.0 - 7.0 %     Assessment & Plan:   1. Type 2 diabetes mellitus with stage 2 chronic kidney disease, with long-term current use of insulin (HCC)   - Gilliam Hawkes has currently uncontrolled symptomatic type 2 DM since  37 years of age. Recent labs reviewed.  Nadia presents with incomplete monitoring of blood glucose.  He presents with a meter showing  less than 1 readings per day on average.  He has 10 readings the last 14 days averaging 175, at fasting.  His recent labs show A1c of 10.2%, increasing from 8.6%.  He did not document any hypoglycemia.     - I had a long discussion with him about the progressive nature of diabetes and the pathology behind its complications. -his diabetes is complicated by CKD, obesity/sedentary life, smoking and he remains at a high risk for more acute and chronic complications which include CAD, CVA, CKD, retinopathy, and neuropathy. These are all discussed in detail with him.  - I have counseled him on diet  and weight management  by adopting a carbohydrate restricted/protein rich diet. Patient is encouraged to switch to  unprocessed or minimally processed     complex starch and increased protein intake (animal or plant source), fruits, and vegetables. -  he is advised to stick to a routine mealtimes to eat 3 meals  a day and avoid unnecessary snacks ( to snack only to correct hypoglycemia).  -He has regained most of the weight he lost when he was dealing with severe  hyperglycemia with glycosuria.   - he acknowledges that there is a room for improvement in his food and drink choices. - Suggestion is made for him to avoid simple carbohydrates  from his diet including Cakes, Sweet Desserts, Ice Cream, Soda (diet and regular), Sweet Tea, Candies, Chips, Cookies, Store Bought Juices, Alcohol in Excess of  1-2 drinks a day, Artificial Sweeteners,  Coffee Creamer, and "Sugar-free" Products, Lemonade. This will help patient to have more stable blood glucose profile and potentially avoid unintended weight gain.  - he has been  scheduled with Norm Salt, RDN, CDE for diabetes education.  - I have approached him with the following individualized plan to manage  his diabetes and patient agrees:   -In light of his presentation with significantly above target glycemic profile, he will continue to need insulin treatment.   He will be switched to premixed insulin to use twice a day from his Lantus.  Since he has a large supply of Lantus at his house, he is advised to continue Lantus 80 units nightly, start monitoring blood glucose twice a day-daily before breakfast and at bedtime and return in 9 weeks with his leftover Lantus before switching him to premixed insulin utilizing NovoLog 70/30 or Humalog 75/25.   He is at risk of insulin mixup if he gets duplicate prescriptions without removing the existing supplies. In the meantime, he is advised to continue metformin 1000 mg p.o. once a day, glipizide 10 mg XL p.o. daily at breakfast.   he is encouraged to call clinic for blood glucose levels less than 70 or above 200 mg /dl.  - he is warned not to take insulin without proper monitoring per orders.  -He is counseled for  smoking cessation, he is too high risk to's give incretin therapy due to his risk of pancreatitis. Tragically, patient continues to smoke.  He has obstructive sleep apnea would benefit the most from weight loss and smoking cessation. The patient was counseled on the dangers of tobacco use, and was advised to quit.  Reviewed strategies to maximize success, including removing cigarettes and smoking materials from environment.   - Specific targets for  A1c;  LDL, HDL,  and Triglycerides were discussed with the patient.  2) Blood Pressure /Hypertension: His blood pressure is not controlled to target.  He did not take his blood pressure medication today since he just woke up.      he is advised to continue his current medications including  chlorthalidone 25 mg p.o. daily, clonidine 0.1 mg p.o. twice daily, hydralazine 50 mg p.o. 3 times daily, Losartan 100 mg p.o. every morning, metoprolol 200 mg p.o. daily.   Admittedly, he consumes salty food, advised to limit salt consumption.   3) Lipids/Hyperlipidemia: Recent lipid panel showed LDL still high at 95.   He is not open for new medications at the moment.   He will be reapproached for statin intervention on subsequent visit.    4)  Weight/Diet:  Body mass index is 56.28 kg/m.  -   clearly complicating his diabetes care.   he is  a candidate for modest weight loss. I discussed with him the fact that loss of 5 - 10% of his  current body weight will have the most impact on his diabetes management.  Exercise, and detailed carbohydrates information provided  -  detailed on discharge instructions.  5) Chronic Care/Health Maintenance:  -he  is on ARB medications and  is encouraged to initiate and continue to follow up with  Ophthalmology, Dentist,  Podiatrist at least yearly or according to recommendations, and advised to  quit smoking. I have recommended yearly flu vaccine and pneumonia vaccine at least every 5 years; moderate intensity exercise for up to 150 minutes weekly; and  sleep for at least 7 hours a day.  - he is  advised to maintain close follow up with Waldon Reining, MD for primary care needs, as well as his other providers for optimal and coordinated care.    I spent 41 minutes in the care of the patient today including review of labs from CMP, Lipids, Thyroid Function, Hematology (current and previous including abstractions from other facilities); face-to-face time discussing  his blood glucose readings/logs, discussing hypoglycemia and hyperglycemia episodes and symptoms, medications doses, his options of short and long term treatment based on the latest standards of care / guidelines;  discussion about incorporating lifestyle medicine;  and documenting the encounter.    Please refer to Patient Instructions for Blood Glucose Monitoring and Insulin/Medications Dosing Guide"  in media tab for additional information. Please  also refer to " Patient Self Inventory" in the Media  tab for reviewed elements of pertinent patient history.  Micah Noel participated in the discussions, expressed understanding, and voiced agreement with the above plans.   All questions were answered to his satisfaction. he is encouraged to contact clinic should he have any questions or concerns prior to his return visit.    Follow up plan: - Return in about 4 months (around 10/26/2022) for Bring Meter/CGM Device/Logs- A1c in Office.  Marquis Lunch, MD [facility] [address] [place], [place] [address] Phone: [phone]  Fax: [phone]    06/25/2022, 4:18 PM  This note was partially dictated with voice recognition software. Similar sounding words can be transcribed inadequately or may not  be corrected upon review.

## 2022-06-25 NOTE — Patient Instructions (Signed)

## 2022-06-26 DIAGNOSIS — I10 Essential (primary) hypertension: Secondary | ICD-10-CM | POA: Insufficient documentation

## 2022-07-09 ENCOUNTER — Encounter: Payer: Self-pay | Admitting: Podiatry

## 2022-07-09 ENCOUNTER — Ambulatory Visit (INDEPENDENT_AMBULATORY_CARE_PROVIDER_SITE_OTHER): Payer: Medicaid Other | Admitting: Podiatry

## 2022-07-09 DIAGNOSIS — B351 Tinea unguium: Secondary | ICD-10-CM

## 2022-07-09 DIAGNOSIS — M79674 Pain in right toe(s): Secondary | ICD-10-CM

## 2022-07-09 DIAGNOSIS — M79675 Pain in left toe(s): Secondary | ICD-10-CM

## 2022-07-09 DIAGNOSIS — E1159 Type 2 diabetes mellitus with other circulatory complications: Secondary | ICD-10-CM | POA: Diagnosis not present

## 2022-07-09 NOTE — Progress Notes (Signed)
This patient returns to my office for at risk foot care.  This patient requires this care by a professional since this patient will be at risk due to having diabetes.   This patient is unable to cut nails himself since the patient cannot reach his nails.These nails are painful walking and wearing shoes.  This patient presents for at risk foot care today. His caregiver states he has developed a painful callus on the inside of left foot. He is brought to the office by caregiver from  [place].  General Appearance  Alert, conversant and in no acute stress.  Vascular  Dorsalis pedis and posterior tibial  pulses are not  palpable  Bilaterally due to swelling..  Capillary return is within normal limits  bilaterally. Temperature is within normal limits  bilaterally.  Neurologic  Senn-Weinstein monofilament wire test within normal limits  bilaterally. Muscle power within normal limits bilaterally.  Nails Thick disfigured discolored nails with subungual debris  from hallux to fifth toes bilaterally. No evidence of bacterial infection or drainage bilaterally.  Orthopedic  No limitations of motion  feet .  No crepitus or effusions noted.  No bony pathology or digital deformities noted.  Skin  normotropic skin with no porokeratosis noted bilaterally.  No signs of infections or ulcers noted.    Onychomycosis  Pain in right toes  Pain in left toes  Consent was obtained for treatment procedures.   Mechanical debridement of nails 1-5  bilaterally performed with a nail nipper.  Filed with dremel without incident.    Return office visit   3 months                 Told patient to return for periodic foot care and evaluation due to potential at risk complications.   Domitila Stetler [name]  

## 2022-07-10 ENCOUNTER — Telehealth: Payer: Self-pay | Admitting: *Deleted

## 2022-07-10 ENCOUNTER — Encounter: Payer: Self-pay | Admitting: *Deleted

## 2022-07-10 ENCOUNTER — Telehealth: Payer: Self-pay

## 2022-07-10 DIAGNOSIS — Z0000 Encounter for general adult medical examination without abnormal findings: Secondary | ICD-10-CM

## 2022-07-10 NOTE — Telephone Encounter (Signed)
Called patient to reschedule missed health coaching appointment. Patient stated that he would like to be called next week. Patient has been scheduled for a telephonic health coaching session on 5/20 at 3:00pm and will be called at that time.    Renaee Munda, MS, ERHD, [facility]  Care Guide, Health & Wellness Coach [address], [address] [place] [address] Telephone: [phone] Email: [email or web address]

## 2022-07-10 NOTE — Telephone Encounter (Signed)
ATC patient on home #, [phone].  No answer, no vm. ATC patient on mobile #, [phone].  Reached his Child psychotherapist.  He provided me with the # I already had on file.  Will attempt another time.

## 2022-07-14 ENCOUNTER — Ambulatory Visit: Payer: Medicaid Other | Attending: Cardiovascular Disease

## 2022-07-14 DIAGNOSIS — Z0000 Encounter for general adult medical examination without abnormal findings: Secondary | ICD-10-CM

## 2022-07-14 NOTE — Progress Notes (Signed)
Appointment Outcome: Completed, Session #: 1                         Start time: 3:01pm  End time: 3:33pm   Total Mins: 32 minutes  AGREEMENTS SECTION   Overall Goal(s): Improve healthy eating habits Increase physical activity                                              Agreement/Action Steps:  Improve healthy eating habits Eat no later than 8pm nightly Replace noodles and chips with fruits/vegetables as snacks Continue to read food labels to monitor sodium and sugar Continue to drink water instead of soda   Increase physical activity Walk to convenient store near home at least 2 days a week Interested in participating in PREP or getting a [employer]    Progress Notes:  Patient stated that he received the Assistance Application of the [employer] and that he was working on it with his Child psychotherapist. Patient reported that he has been walking to the store two days per week and a friend's house one day per week. Patient stated that he walks to the convenient store when he needs something.   Patient mentioned that when he walks to his friend's house, it is further than walking to the store. Patient shared that during the longer walks, he must take a break and rest. Patient stated that he doesn't have specific days that he walks, but he walks late in the evening when it is cooler. Patient stated that walking is not as difficult as it was before and that it makes, he feels good that he is walking.   Patient shared that his biggest challenge is his sleep pattern, which impacts his eating schedule. Patient reported that he falls asleep in the early morning hours but no later than 6am. Patient stated that he sleeps during the daytime and do not eat until he is ready to go to bed. Patient stated that he has been eating later than 8pm each night because he is up watching tv and engaged with social media. Patient stated that he only drink water during the day and will have a 20 oz.  Gatorade or Powerade once a day.   Patient mentioned that he hasn't read food labels on some of the items that he consumes such as pizza rolls and frozen hamburger patties. Patient stated that he does not add salt to his food when he cooks them. Patient stated that he has not replaced his noodles and chips with fruit/vegetables as snacks. Patient stated that he must be careful what types of fruits he eats per his medical provider. Patient stated that he has started to eat less of the noodles and switched from packs of noodles to a cup of noodles. Patient stated that he does not eat them every day and has gone two days without eating them.  Discussed with patient the importance of reading food labels to monitor his sodium and sugar intake. Educated patient on the recommendation of consuming 1,500mg  of sodium when hypertensive. Discussed the sodium content in the foods that he currently eats and the Gatorade/Powerade. Patient stated that he drinks the Zero version of those beverages, but not consistently to minimize his sodium and sugar intake. Patient stated that he is considering going Vegan and need to figure  out what he can eat before starting the transition.     Indicators of Success and Accountability:  Patient has been able to maintain drinking water instead of soda. Patient stated that he has cut back on the noodles to monitor his sodium intake.   Readiness: Patient is in the action stage of improving healthy eating habits and increasing physical activity.   Strengths and Supports: Patient is being supported by his Child psychotherapist. Patient is relying on being open-minded and persistent.  Challenges and Barriers: Patient is being challenged by a disruption in his sleep cycle, which affects the timing of his eating.    Coaching Outcomes:  Patient wants to focus on improving his sleep hygiene so that he can work on the timing of his last meal.   Patient is collaborating with his Child psychotherapist  to complete the Chief of Staff for a [employer]   Patient will continue to work towards implementing his actions steps to improve his eating habits and increase physical activity as outlined above. Patient did not make revisions at this time to his action steps.   Sent patient information on improving sleep hygiene for his review, along with information on a low sodium diet, and plant-based diet as requested.     Attempted: Fulfilled - Patient continues to drink water instead of soda. Patient has walked to the convenient store near his home at least 2 days per week.   Partial - Patient read some of the labels on the foods he consumes but is not consistent when making healthier choices.   Not met - Patient did not replace noodles and chips with fruits/vegetables as snacks.    Not attempted: Deferred - Patient expressed that he wants to improve his eating habits by eating no later than 8pm but wants to improve his sleep hygiene first before addressing this step.

## 2022-07-28 ENCOUNTER — Ambulatory Visit: Payer: Medicaid Other | Attending: Cardiology

## 2022-07-28 ENCOUNTER — Encounter: Payer: Self-pay | Admitting: Internal Medicine

## 2022-07-28 ENCOUNTER — Telehealth: Payer: Self-pay

## 2022-07-28 DIAGNOSIS — Z0000 Encounter for general adult medical examination without abnormal findings: Secondary | ICD-10-CM

## 2022-07-28 NOTE — Telephone Encounter (Signed)
Called patient twice to hold telephonic health coaching appointment. Was not able to reach the patient or leave a voicemail for patient to return call.    Renaee Munda, MS, ERHD, [facility]  Care Guide, Health & Wellness Coach [address], [address] [place] [address] Telephone: [phone] Email: [email or web address]

## 2022-07-31 ENCOUNTER — Telehealth: Payer: Self-pay | Admitting: Cardiovascular Disease

## 2022-07-31 DIAGNOSIS — Z794 Long term (current) use of insulin: Secondary | ICD-10-CM

## 2022-07-31 NOTE — Telephone Encounter (Signed)
Please assist with refill.  Thank you. 

## 2022-08-04 ENCOUNTER — Telehealth: Payer: Self-pay

## 2022-08-04 DIAGNOSIS — Z0000 Encounter for general adult medical examination without abnormal findings: Secondary | ICD-10-CM

## 2022-08-04 NOTE — Telephone Encounter (Signed)
Called patient to reschedule missed health coaching appointment. Patient did not answer and was unable to leave a message due to voicemail not setup. Called Ether Griffins to relay message to patient to call to reschedule health coaching appointment. Johnny attempted a conference call and patient did not answer. He stated that he will relay the message once he is able to talk to the patient.   Renaee Munda, MS, ERHD, [facility]  Care Guide, Health & Wellness Coach [address] [place] [address] Telephone: [phone] Email: [email or web address]

## 2022-08-07 MED ORDER — OZEMPIC (2 MG/DOSE) 8 MG/3ML ~~LOC~~ SOPN: 2.0000 mg | PEN_INJECTOR | SUBCUTANEOUS | 3 refills | Status: DC

## 2022-08-07 NOTE — Telephone Encounter (Signed)
Patient has lost 42 pounds since beginning of 2024.  Using Ozempic 1 mg currently and ready to go to 2 mg dose

## 2022-08-07 NOTE — Telephone Encounter (Signed)
*  STAT* If patient is at the pharmacy, call can be transferred to refill team.   1. Which medications need to be refilled? (please list name of each medication and dose if known) Semaglutide, 1 MG/DOSE, (OZEMPIC, 1 MG/DOSE,) 4 MG/3ML SOPN   2. Which pharmacy/location (including street and city if local pharmacy) is medication to be sent to? [facility] [address] [place] - [address]   3. Do they need a 30 day or 90 day supply? 30 day

## 2022-08-11 ENCOUNTER — Ambulatory Visit (INDEPENDENT_AMBULATORY_CARE_PROVIDER_SITE_OTHER): Payer: Medicaid Other | Admitting: Gastroenterology

## 2022-08-19 ENCOUNTER — Telehealth: Payer: Self-pay

## 2022-08-19 DIAGNOSIS — Z0000 Encounter for general adult medical examination without abnormal findings: Secondary | ICD-10-CM

## 2022-08-19 NOTE — Telephone Encounter (Signed)
Called patient to reschedule missed health coaching session. Patient stated that he is available around 3:00pm and was scheduled for an appointment over the phone on 6/27 for that time. Patient will be called as requested.    Renaee Munda, MS, ERHD, [facility]  Care Guide, Health & Wellness Coach [address], [address] [place] [address] Telephone: [phone] Email: [email or web address]

## 2022-08-21 ENCOUNTER — Ambulatory Visit: Payer: Medicaid Other | Attending: Cardiology

## 2022-08-21 DIAGNOSIS — Z0000 Encounter for general adult medical examination without abnormal findings: Secondary | ICD-10-CM

## 2022-08-21 NOTE — Progress Notes (Signed)
Appointment Outcome: Completed, Session #: 2                         Start time: 3:04pm   End time: 3:31pm   Total Mins: 27 minutes  AGREEMENTS SECTION   Overall Goal(s): Improve healthy eating habits Increase physical activity                                              Agreement/Action Steps:  Improve healthy eating habits Eat no later than 8pm nightly Replace noodles and chips with fruits/vegetables as snacks Continue to read food labels to monitor sodium and sugar Continue to drink water instead of soda   Increase physical activity Walk to convenient store near home at least 2 days a week Interested in participating in PREP or getting a [employer]    Progress Notes:  Patient reported that he is walking more than average because he is working on improving the health of his legs. Patient stated that he noticed a difference in how his legs felt when he was walking on the treadmill at the [facility] and decided that he needed to walk more often. Patient stated that he does walk to the convenient store two or more times per week. Patient stated that sometimes he walks to the store just to have something to do when he needs to pick up something.   Patient mentioned that his social worker was helping him with the [employer] but do not know the status of the application. Patient stated that he is not sure if he would be able to ride the [place] to the [facility] as he did before. Patient expressed that he is still interested in participating in PREP if he is eligible.  Patient stated that since he is taking Ozempic, he doesn't have an appetite. Patient shared that he still has a challenge with eating after 8pm due to taking naps during the day, which throws off his eating schedule. Patient reported that he is typically up until 5am and the latest that he eats is around 3am before going back to sleep.   Patient stated that this challenge to not sleeping as much is having something to do  during the day. Patient expressed that he sleeps due to boredom. Discussed with patient things that he could do that would help stimulate him to stay awake. Patient stated that he reads and watch documentaries but it's still a challenge.   Patient shared that he is reading food labels and since he eats twice daily, he is able to manage his sodium and sugar consumption better. Patient stated that he eats smaller portions and less frequently. Patient shared an example of meals that he ate one day and stated that he was able to stay around 1500mg  of sodium. Patient reported that he has been able to refrain from drinking sodas by drinking water over the past week. Patient stated that he walked to the convenient store to get a jug of purified water to hold himself accountable.   Patient mentioned that he has reduced the number of times he eats noodles and chips during the week. Patient stated that he doesn't have transportation to the grocery store as often as he would like, which impacts the food choices he makes when he visits the convenient store. Patient shared that he has  increased his fruit and vegetable intake by eating watermelon and a vegetable tray that consists of broccoli, celery, carrots, and red tomatoes.   Patient expressed that he wants to go vegan to improve his eating habits but doesn't know what to cook. Patient stated that he is enjoying eating mushrooms from the [employer] but is not sure what kind they are. Patient shared that he is motivated to make these health behavior changes because of his family's medical history, and he wants to prolong his life.     Indicators of Success and Accountability:  Patient has increased his physical activity by walking more than twice per week.   Readiness: Patient is in the action stage of increasing physical activity and improving eating habits.   Strengths and Supports: Patient is relying on being dedicated to change to save his  life.  Challenges and Barriers: Patient stated that he is bored during the day, which leads him to take naps, which interrupts his eating schedule.     Coaching Outcomes: Patient will follow up on [employer] membership application and if he is able to ride the [place] as before.   Patient will research vegan recipes that he would like to try.   Patient is interested in knowing if he can attend PREP again.   Patient will continue to work on sleep schedule as he believes that going to the gym would help him organize his day better.   Patient will continue to implement his action steps as outlined above over the next two weeks.     Attempted: Fulfilled - Patient continues to read food labels to monitor sodium and sugar content. Patient is walking to the convenient store two or more days per week.   Partial - Patient has been able to refrain from drinking soda for one week since 5/20 by drinking water. Patient has not completely replaced noodles and chips but is consuming more fruits/vegetables. Patient has worked on [employer] but has not followed up on paperwork and transportation.  Not met - Patient has not been able to eat no later than 8pm nightly.    Referrals: Sent email to Laurita Quint regarding patient's eligibility to participate in PREP again.   Emailed patient links to the following American Diabetes Association and American Heart Association sites to help him research recipes that he would be interested in trying because he stated that he wants to go vegan but need to know what to cook.     Diabetes Food Scientist, research (life sciences)  Quick and Easy  American Heart Association Recipes  AHA Vegetarian Lifestyle Recipes  American Heart Association Recipes  Budget Friendly  American Heart Association Recipes

## 2022-08-27 ENCOUNTER — Telehealth: Payer: Self-pay

## 2022-08-27 DIAGNOSIS — Z0000 Encounter for general adult medical examination without abnormal findings: Secondary | ICD-10-CM

## 2022-08-27 NOTE — Telephone Encounter (Signed)
Called patient to inform him that I will be at [facility] on 7/10 when he is scheduled to be seen in the [facility] office on 1:30pm with the pharmacy. Tried to determine if he wanted to reschedule an in-person session at [facility] later the same day or 7/11 for a telephone visit. Patient stated that his social worker is off this week and he will have to touch base with him on Monday, July 8th. Patient requested a call back to provide an update on what his social worker advises him.   Renaee Munda, MS, ERHD, [facility]  Care Guide, Health & Wellness Coach [address], [address] [place] [address] Telephone: [phone] Email: [email or web address]

## 2022-09-01 ENCOUNTER — Telehealth: Payer: Self-pay

## 2022-09-01 DIAGNOSIS — Z0000 Encounter for general adult medical examination without abnormal findings: Secondary | ICD-10-CM

## 2022-09-01 NOTE — Telephone Encounter (Signed)
Called patient back to determine if he would be able to physically come to [facility] on 7/10 or if the appointment needed to be rescheduled for another day. Patient stated that he would be able to have his social worker bring him to [facility] on 7/10 after his visit with [facility] pharmacy at 1:30pm. Patient's appointment has been rescheduled and location updated.    Renaee Munda, MS, ERHD, [facility]  Care Guide, Health & Wellness Coach [address] [place] [address] Telephone: [phone] Email: [email or web address]

## 2022-09-01 NOTE — Telephone Encounter (Signed)
Called patient to follow up on changed of site location for his visit on 7/10 at 2:00pm for health coaching since I will be at [facility] that day instead of [facility] where he has a visit at 1:30pm. Patient is going to follow up with his social worker to determine if he will be able to bring him to [facility] after his visit at [facility]. Will call patient back to determine if his appointment will have to be rescheduled.    Renaee Munda, MS, ERHD, [facility]  Care Guide, Health & Wellness Coach [address] [place] [address] Telephone: [phone] Email: [email or web address]

## 2022-09-03 ENCOUNTER — Encounter: Payer: Self-pay | Admitting: Student

## 2022-09-03 ENCOUNTER — Telehealth: Payer: Self-pay | Admitting: Pharmacist

## 2022-09-03 ENCOUNTER — Ambulatory Visit (HOSPITAL_BASED_OUTPATIENT_CLINIC_OR_DEPARTMENT_OTHER): Payer: Medicaid Other

## 2022-09-03 ENCOUNTER — Ambulatory Visit: Payer: Medicaid Other | Attending: Cardiology | Admitting: Student

## 2022-09-03 ENCOUNTER — Telehealth (HOSPITAL_BASED_OUTPATIENT_CLINIC_OR_DEPARTMENT_OTHER): Payer: Self-pay

## 2022-09-03 DIAGNOSIS — Z0000 Encounter for general adult medical examination without abnormal findings: Secondary | ICD-10-CM

## 2022-09-03 NOTE — Progress Notes (Signed)
Office Visit    Patient Name: Melvin Ray Date of Encounter: 09/03/2022  Primary Care Provider:  Waldon Reining, MD Primary Cardiologist:  Dina Rich, MD  Chief Complaint    Weight management  Significant Past Medical History   Obesity BMI 60.5  DM A1c 10.2; lantus 80 units at bedtime, glipizide 10 mg daily, metformin 1000 mg BID  HTN/SVT chlorthalidone 25 mg daily, clonidine 0.3 mg BID, diltiazem 300 mg daily, losartan 100 mg daily, metoprolol succinate 200 mg daily, hydralazine 100 mg TID  OSA BiPAP    No Known Allergies  History of Present Illness    Melvin Ray is a 37 year old male who was referred by Ronie Spies, NP to discuss diabetes/weight loss medications. He has a BMI of 60.5 and he also has T2DM with an A1c of 10.2.   Today He presents to the PharmD clinic today follow up for weight loss with social worker Vilinda Blanks who is his main point of contact.he returns for follow up.  He is on 2 mg of Ozempic now and his BG now staying in less than 150 mg/dl range so he does not take basal insulin. His appetite is getting lower and lower but he tries to eat healthy small meals but his meal choices are still limited mainly to processed and pre-package food. He walks to grocery store very other day for 20 min. He is motivated to initiate some resistance chair exercise.   Confirmed patient has no personal or family history of medullary thyroid carcinoma (MTC) or Multiple Endocrine Neoplasia syndrome type 2 (MEN 2).   Adherence: pill packs from [facility]   Family Hx: Diabetes in his maternal grandmother; Heart failure in his father and paternal grandmother; Hypertension in his father  Social Hx: smokes cigars, vapes regularly, denies alcohol use    Accessory Clinical Findings    Lab Results  Component Value Date   CREATININE 1.17 05/08/2022   BUN 16 05/08/2022   NA 141 05/08/2022   K 4.1 05/08/2022   CL 98 05/08/2022   CO2 25 05/08/2022   Lab  Results  Component Value Date   ALT 19 05/09/2021   AST 3 (A) 05/09/2021   ALKPHOS 100 05/09/2021   Lab Results  Component Value Date   HGBA1C 7.4 (A) 06/25/2022      Home Medications/Allergies    Current Outpatient Medications  Medication Sig Dispense Refill   metFORMIN (GLUCOPHAGE-XR) 500 MG 24 hr tablet Take 1,000 mg by mouth 2 (two) times daily with breakfast and lunch.     ACCU-CHEK GUIDE test strip 3 (three) times daily.     Accu-Chek Softclix Lancets lancets 3 (three) times daily.     albuterol (VENTOLIN HFA) 108 (90 Base) MCG/ACT inhaler Inhale 1-2 puffs into the lungs every 6 (six) hours as needed for wheezing or shortness of breath.     amLODipine (NORVASC) 2.5 MG tablet Take 1 tablet (2.5 mg total) by mouth daily. (Patient taking differently: Take 5 mg by mouth daily.) 90 tablet 3   chlorthalidone (HYGROTON) 25 MG tablet TAKE 1 TABLET ONCE DAILY. 90 tablet 1   Cholecalciferol (VITAMIN D3) 50 MCG (2000 UT) TABS Take 2,000 Units by mouth daily.     cloNIDine (CATAPRES) 0.3 MG tablet Take 0.3 mg by mouth 3 (three) times daily.     diltiazem (CARDIZEM CD) 300 MG 24 hr capsule Take 300 mg by mouth daily.     FEROSUL 325 (65 Fe) MG tablet  Take 325 mg by mouth daily.     FLUoxetine (PROZAC) 20 MG capsule Take 20 mg by mouth daily.     Fluticasone-Salmeterol (ADVAIR) 250-50 MCG/DOSE AEPB Inhale 1 puff into the lungs daily.     hydrALAZINE (APRESOLINE) 100 MG tablet Take 100 mg by mouth 3 (three) times daily.     insulin glargine (LANTUS SOLOSTAR) 100 UNIT/ML Solostar Pen Inject 50 Units into the skin at bedtime. 30 mL 1   losartan (COZAAR) 100 MG tablet Take 100 mg by mouth at bedtime.     metoprolol (TOPROL-XL) 200 MG 24 hr tablet Take 200 mg by mouth daily.     NON FORMULARY Pt uses a cpap nightly     pantoprazole (PROTONIX) 40 MG tablet Take 40 mg by mouth daily.     potassium chloride (KLOR-CON) 10 MEQ tablet Take 10 mEq by mouth daily.     Semaglutide, 2 MG/DOSE, (OZEMPIC,  2 MG/DOSE,) 8 MG/3ML SOPN Inject 2 mg into the skin every 7 (seven) days. 3 mL 1   Semaglutide, 2 MG/DOSE, (OZEMPIC, 2 MG/DOSE,) 8 MG/3ML SOPN Inject 2 mg into the skin every 7 (seven) days. 3 mL 3   traZODone (DESYREL) 100 MG tablet Take 200 mg by mouth at bedtime.      No current facility-administered medications for this visit.     No Known Allergies  Assessment & Plan    Morbid obesity (HCC) Assessment and Plan: On Ozempic 2 mg dose tolerates it well   Weight BMI % weight loss from baseline  Baseline 202.78 60.5   Last visit 190.78 57.3 5.7 %  Today  184.4 55.14 8.9 %  Walks 20 min every other day to grocery store Concern about the cost as pharmacy had said he requires PA renewal previous notes states PA is not required Will reach out to the pharmacy to rule out barrier to get refill     Carmela Hurt, Pharm.D [facility] of [facility] [address], [place], [place] [address]  Phone: [phone]; Fax: [phone]

## 2022-09-03 NOTE — Telephone Encounter (Signed)
Called patient to determine if he was close by for his appointment. Patient would not make it in time for his appointment and is requesting to be rescheduled for another in-person appointment. Patient's social worker requested a call tomorrow around 8:15am to schedule patient's appointment when he would be able to transport him. Patient's appointment for today has been cancelled.   Renaee Munda, MS, ERHD, [facility]  Care Guide, Health & Wellness Coach [address] [place] [address] Telephone: [phone] Email: [email or web address]

## 2022-09-03 NOTE — Assessment & Plan Note (Addendum)
Assessment and Plan: On Ozempic 2 mg dose tolerates it well   Weight BMI % weight loss from baseline  Baseline 202.78 60.5   Last visit 190.78 57.3 5.7 %  Today  184.4 55.14 8.9 %  Walks 20 min every other day to grocery store Concern about the cost as pharmacy had said he requires PA renewal previous notes states PA is not required Will reach out to the pharmacy to rule out barrier to get refill

## 2022-09-04 ENCOUNTER — Telehealth: Payer: Self-pay

## 2022-09-04 ENCOUNTER — Other Ambulatory Visit (HOSPITAL_COMMUNITY): Payer: Self-pay

## 2022-09-04 DIAGNOSIS — Z0000 Encounter for general adult medical examination without abnormal findings: Secondary | ICD-10-CM

## 2022-09-04 NOTE — Telephone Encounter (Signed)
Pharmacy Patient Advocate Encounter   Received notification from [facility] that prior authorization for [facility] is required/requested.   PA submitted to [facility] via CoverMyMeds Key or (Medicaid) confirmation # [id number]   Status is pending

## 2022-09-04 NOTE — Telephone Encounter (Signed)
Pharmacy Patient Advocate Encounter  Received notification from [facility] Medicaid that Prior Authorization for { Ozempic (2 MG/DOSE)has been APPROVED from 6.27.24 to 7.11.25.Marland Kitchen  Key: (Key: [id number])

## 2022-09-04 NOTE — Telephone Encounter (Signed)
Attempted to call patient to advise him of his upcoming appointment that his social worker will be bringing him to on 7/22 at 1:30pm. Patient did not answer. Voicemail not setup to leave a message.   Renaee Munda, MS, ERHD, [facility]  Care Guide, Health & Wellness Coach [address] [place] [address] Telephone: [phone] Email: [email or web address]

## 2022-09-04 NOTE — Telephone Encounter (Signed)
Called patient's social worker as requested to reschedule health coaching appointment. Patient has been scheduled for an in-person visit on 7/22 at 1:30pm.   Renaee Munda, MS, ERHD, [facility]  Care Guide, Health & Wellness Coach [address] [place] [address] Telephone: [phone] Email: [email or web address]

## 2022-09-05 ENCOUNTER — Telehealth: Payer: Self-pay

## 2022-09-05 DIAGNOSIS — Z0000 Encounter for general adult medical examination without abnormal findings: Secondary | ICD-10-CM

## 2022-09-05 NOTE — Telephone Encounter (Signed)
Called N/A LVM Inform about approval.

## 2022-09-05 NOTE — Telephone Encounter (Signed)
Attempted to contact patient to advise him of upcoming appt on 7/22 at 1:30pm. Patient does not have a voicemail setup and was unable to leave a message both times he was called.    Renaee Munda, MS, ERHD, [facility]  Care Guide, Health & Wellness Coach [address] [place] [address] Telephone: [phone] Email: [email or web address]

## 2022-09-05 NOTE — Telephone Encounter (Signed)
PA approved.

## 2022-09-10 NOTE — Telephone Encounter (Signed)
Closing encounter. No documentation

## 2022-09-15 ENCOUNTER — Ambulatory Visit: Payer: Medicaid Other | Attending: Cardiology

## 2022-09-15 ENCOUNTER — Telehealth: Payer: Self-pay

## 2022-09-15 DIAGNOSIS — Z0000 Encounter for general adult medical examination without abnormal findings: Secondary | ICD-10-CM

## 2022-09-15 NOTE — Progress Notes (Signed)
Appointment Outcome: Completed, Session #: 3                         Start time: 1:45pm   End time: 2:30pm   Total Mins: 45 minutes  AGREEMENTS SECTION   Overall Goal(s): Improve healthy eating habits Increase physical activity                                              Agreement/Action Steps:  Improve healthy eating habits Eat no later than 8pm nightly Replace noodles and chips with fruits/vegetables as snacks Continue to read food labels to monitor sodium and sugar Continue to drink water instead of soda   Increase physical activity Walk to convenient store near home at least 2 days a week Interested in participating in PREP or getting a [employer]    Progress Notes:  Patient reported that he has been walking to the convenient store 3-4 times a week since his last health coaching appointment. Patient doesn't know how long it takes him to complete that walk but estimates it to be 3 blocks long each way (total of 6 blocks). Patient stated that he is not tired when he returns from the store. Patient expressed that he prefers to walk on the treadmill at the [facility] because it is inside in a control event.   Patient reported that he was walking on the treadmill 3 times a week for one hour each time. Patient stated that there is a safety concern in his neighborhood and the heat makes him exhausted. Patient shared that this is a challenge because he goes walking typically around 6:00pm or 7:00pm. Patient shared that getting a ride to the gym and the cost of the gym membership are barriers to increasing his physical activity. Patient expressed that he would check on if his transportation is covered by his insurance but knows that the [employer] is only going to cover half the cost of the gym membership.   Discussed with patient about increasing his walking to the store or other locations and performing body weight exercises. Patient stated that he can walk more but he feels there is no need  to do body weight exercises when he will not be able to do a full set. Discussed with the patient how starting small and building his way up could be beneficial in increasing his physical activity and weight loss goals. Patient stated that he wasn't interested in those types of exercises but would exercise at home if he had equipment such as a treadmill and weights.   Patient mentioned that he eats twice per day. Patient expressed that he doesn't eat much so he doesn't see how his eating is bad. Patient stated that he is taking Ozempic, and he cannot eat as he used to. Patient shared that he has cut back to eating packaged noodles with seasoning to once per week. Patient stated that he eats chips a couple time per week with sandwiches. Patient stated he does not eat sandwiches every day. Patient reported that he had mixed vegetables and grilled chicken for dinner that his aunt prepared. Patient expressed his dislike for grilled chicken.   Discussed with patient the amount of sodium per day (1500mg ) that is recommended for a person with hypertension to consume. Patient stated that he is reading food labels and gave  an example of how much sodium is in one precooked chicken patty contains. Patient stated that he cut back on the number of servings that he would normally consume during one meal (e.g., 1 patty instead of 2). Discussed with patient how cooking his own chicken can help him control how much sodium he is consuming in one sitting. Patient expressed dislike for cooking because it takes too long and stated that frozen meals and those patties are easy and quick to prepare.  Patient mentioned that he is eating salads during the week such as spinach and kale. Patient shared that it doesn't taste good to him, but he eats it. Patient stated that whenever he can get someone to take him to the grocery store, he picks up whatever he can get. Patient stated that he mainly drinks water but will have an occasional  Powerade or soda. Patient stated that he will drink a soda if his sugar becomes too low such as Dr. Reino Kent.    Indicators of Success and Accountability:  Patient stated that he has focused on not eating as much at one time and cutting back to eating twice per day.  Readiness: Patient is in the action stage of increasing his physical activity and improving his healthy eating habits.   Strengths and Supports: Patient does not have support. Patient is relying on previous experience of changing habits to lose weight.  Challenges and Barriers: Patient stated that neighborhood safety and not having exercise equipment at home or a gym membership is a barrier to him increasing his physical activity level due to the temperature.    Coaching Outcomes: Patient expressed that he is comparing his current self to his younger self who was a football athlete and feel that he should be able to engage in the same activities as before but feels that he cannot get started due to his weight. Patient has determined that over the next two weeks that it is important for him to learn if he is able to get transportation back and forth to the [facility]. Patient is interested in determining if there are additional resources that could possibly help him cover the cost of the half of the [facility] membership that the organization will not cover.   Patient expressed that in the meantime of determining if joining the [facility] is an option, he is going to continue walking. Patient determined that he did not want to make changes in his action steps or eating habits at this time.     Attempted: Fulfilled - Patient was able to fulfill his action steps to continue working towards increasing his physical activity. Patient is reading food labels to monitor sodium and sugar intake.   Partial - Patient substitutes noodles and chips 50-60% of the time each week. Patient reported drinking soda occasionally.   Not met - Patient continues to eat  after 8pm nightly.

## 2022-09-15 NOTE — Telephone Encounter (Signed)
Called patient to remind him of in-person health coaching session today at 1:30pm. Patient did not answer and was unable to leave a voicemail. Called Child psychotherapist and he stated that he reminded patient last week of appointment over the phone and will be bringing him for his appointment.   Renaee Munda, MS, ERHD, [facility]  Care Guide, Health & Wellness Coach [address] [place] [address] Telephone: [phone] Email: [email or web address]

## 2022-09-29 ENCOUNTER — Ambulatory Visit: Payer: Medicaid Other | Attending: Cardiovascular Disease

## 2022-09-29 ENCOUNTER — Telehealth: Payer: Self-pay

## 2022-09-29 DIAGNOSIS — Z0000 Encounter for general adult medical examination without abnormal findings: Secondary | ICD-10-CM

## 2022-09-29 NOTE — Progress Notes (Unsigned)
Appointment Outcome: Completed, Session #: 4                       Start time: 4:00pm   End time: 4:38pm   Total Mins: 28 minutes  AGREEMENTS SECTION   Overall Goal(s): Improve healthy eating habits Increase physical activity                                              Agreement/Action Steps:  Improve healthy eating habits Eat no later than 8pm nightly Replace noodles and chips with fruits/vegetables as snacks Continue to read food labels to monitor sodium and sugar Continue to drink water instead of soda   Increase physical activity Walk to convenient store near home at least 2 days a week    Progress Notes:  Patient reported that he went grocery shopping recently at [place] and purchased food to last him until this Friday. Patient stated that he got frozen pizzas, chicken patties, ham, and hotdogs. Patient mentioned that he purchased beef lo mien last night from a [employer] but only at the broccoli and carrots by 8pm. Patient stated that he is going to eat the lo mien today for dinner. Patient shared that he has been eating more vegetables and fruit such as watermelon instead of the chips and noodles. Patient stated that he has not had noodles in over a week. Patient has chips at home but do not eat them as often because he is trying to cut back.  Patient stated that he went shopping for food, he did not read the food labels. Patient mentioned that he eats the same foods regularly and is familiar with the amount of sodium in them. Patient shared that since he eats once per day, he is consuming approximately 1,500mg  of sodium per day.  Patient shared that he pays someone to cook meals for him on two Sundays per month. Patient stated that the lady that prepares his food cooks healthy foods, but he has not been able to adjust to her way of seasoning food. Patient stated that he does not have much of an appetite due to taking Ozempic. Patient shared that he eats less than he used  to because he gets full quicker, and stays satiated longer.    Patient stated that he has walked to the store twice per week, but not as much as he has in the previous weeks due to the rain. Patient mentioned that when he walks to the convenient store during the week, it's mainly to purchase things to drink such as water. Patient stated that he has drunk soda in the past two weeks but have noticed that he has about 80% of a 20 oz soda left the following day once he opens it.   Patient expressed that he desires to increase his physical activity by going to the gym. Patient stated that he does not have transportation to the gym. Patient mentioned that he does not have trouble paying for a gym membership. Patient stated that he would choose the gym membership that is cheaper between the [facility] and [employer]. Patient shared that he prefers the [facility] because of the quiet environment.    Indicators of Success and Accountability: Patient stated that he has been able to refrain from eating past 8:00pm on some nights due to going to sleep earlier.  Readiness: Patient is in the action stage of improving healthy eating habits and increasing physical activity.   Strengths and Supports: Patient has a friend and Child psychotherapist that supports him. Patient is relying on being mindful of the volume of food he eats daily.  Challenges and Barriers: Patient has transportation issues with getting a ride to the gym on a regular basis.      Coaching Outcomes: Patient will reach out to Ether Griffins to determine what resources he has for transportation to the gym. Patient stated that he has no issue with paying for a gym membership at the [facility] or [employer], his concern is having reliable and consistent transportation.  Patient will continue to implement his action steps as outlined above over the next two weeks without revisions.    Attempted: Fulfilled - Patient has been able to maintain walking to the  convenient store two days per week.   Partial - Patient has been able to get no later than 8pm on a few occasions but not daily. Patient occasionally replaces noodles and chips with fruit/vegetables throughout the week. Patient continues to drink water but drink small amounts of soda during the week as well.   Not met - Patient did not read food labels to monitor sodium and sugar intake.

## 2022-09-29 NOTE — Telephone Encounter (Signed)
Patient requested that Melvin Ray be called to schedule appointment for his next in-person health coaching appointment due to transportation. Patient agreed to 10/13/22 at 2:00pm if it worked with his social worker's schedule. Melvin Ray agreed to the same date and time for the health coaching appointment. Patient has been scheduled accordingly.   Melvin Munda, MS, ERHD, [facility]  Care Guide, Health & Wellness Coach [address] [place] [address] Telephone: [phone] Email: [email or web address]

## 2022-10-13 ENCOUNTER — Ambulatory Visit: Payer: Medicaid Other

## 2022-10-13 ENCOUNTER — Telehealth: Payer: Self-pay

## 2022-10-13 DIAGNOSIS — Z0000 Encounter for general adult medical examination without abnormal findings: Secondary | ICD-10-CM

## 2022-10-13 NOTE — Telephone Encounter (Signed)
Patient's social worker called and stated that he reached out to the patient regarding his in-person health coaching appt today. The social worker stated that the patient informed him that he was not ready to be seen in-person today and requested a telephonic appointment today as scheduled. Will call patient as requested during appt time.    Renaee Munda, MS, ERHD, [facility]  Care Guide, Health & Wellness Coach [address] [place] [address] Telephone: [phone] Email: [email or web address]

## 2022-10-13 NOTE — Telephone Encounter (Signed)
Called patient to hold health coaching session over the phone. Patient stated that he would prefer to hold the session later in the week due to extenuating circumstances. Patient stated that 8/22 would work better. Patient has been rescheduled for a phone call on that day at 3:30pm. Patient will be called at this time.    Renaee Munda, MS, ERHD, [facility]  Care Guide, Health & Wellness Coach [address] [place] [address] Telephone: [phone] Email: [email or web address]

## 2022-10-14 ENCOUNTER — Ambulatory Visit: Payer: Medicaid Other | Admitting: Podiatry

## 2022-10-16 ENCOUNTER — Ambulatory Visit: Payer: Medicaid Other | Attending: Cardiovascular Disease

## 2022-10-16 DIAGNOSIS — Z0000 Encounter for general adult medical examination without abnormal findings: Secondary | ICD-10-CM

## 2022-10-16 NOTE — Progress Notes (Addendum)
Appointment Outcome: Completed, Session #: 5                        Start time: 3:31pm  End time: 3:53pm   Total Mins: 22 minutes  AGREEMENTS SECTION   Overall Goal(s): Improve healthy eating habits Increase physical activity  Agreement/Action Steps:  Eat no later than 8pm nightly Replace noodles and chips with fruit/vegetables as snacks Read food labels to monitor sodium and sugar intake Drink water instead of soda    Progress Notes:  Patient shared that he is dealing with legal matters that has taken his attention away from focusing on his health goals. Patient reported that he has not walked to the store over the past two weeks due to not feeling safe in his neighborhood. Patient shared that due to having to focus on other things, he was not able to figure out transportation to the [facility]   Patient stated he has not been eating as much during the day in addition to him eating smaller portions. Patient shared that there have been times where he fell asleep without eating dinner. Patient reported that there have been a few nights that he has eaten between 8pm-9pm, while on other days it was before 8pm. Patient mentioned that he is going to bed earlier between 11pm-12am instead of after 2am.  Patient expressed that his eating habits changed due to being tired and stressed. Patient shared that he at a sandwich and chips for dinner the night before. Patient mentioned that the person that was cooking meals for him does not cook every Sunday for him. Patient stated that he doesn't like to cook often and that is why he chooses quick meals that are frozen. Patient stated that he has been eating one frozen Jimmy Dean sausage and cheese sandwich for breakfast.   Discussed with patient how reading food labels influences his food choices. Patient reported that he read food labels sometimes. Inquired if patient was aware of the sodium content in the breakfast sandwich. Patient read the food label and  was surprised that one sandwich contained 760mg  of sodium. Discussed with patient how reading food labels would be a helpful tool to making better food choices. Patient shared that he doesn't eat noodles as often as he did before because he read the food label and determine that they had a lot of sodium.   Patient mentioned that he purchases low-sodium foods (e.g., canned goods and frozen products) when they are available. Patient shared that he continues to purchase vegetable tries and prefers fresh vegetables. Patient stated that there are times that he will purchase vegetables from the [employer] and prepackaged salads from the grocery store to ensure he is consuming vegetables throughout the week.   Patient reported that he drinks one 12-oz. soda per day. Patient was curious to determine if the soda contained sodium and how much sugar was in a regular Root Beer. Patient expressed that he was surprised that it had any sodium in the soda and that it contained over 40 grams of sugar in one serving. Patient stated that he drinks water throughout the day and wants a different taste at times. Discussed with patient alternatives to drinking soda  Patient expressed that he wants to continue to focus on improving his eating habits and feel that he is on a good path to doing so since he has cut back on the number of servings and portion sizes that he eats during his  meals.    Indicators of Success and Accountability: Patient has been able to eat no later than 8pm on some nights compared to previous weeks with eating after 12am.  Readiness: Patient is in the action stage of improving healthy eating habits and is in the relapse stage with increasing physical activity.  Strengths and Supports: Patient is being supported by his Child psychotherapist. Patient is relying on being mindful of his portion sizes.  Challenges and Barriers: Patient does not feel safe in his neighborhood to take walks to the  store.   Coaching Outcomes: Patient will continue to implement his action steps as outlined above over the next two weeks except for focusing on increasing physical activity until he is able to identify a safe place to walk or get transportation to the [facility].    Attempted: Partial - Patient was able to implement his action steps to improve healthy eating habits but not on a consistent basis from day to day.  Not met - Patient did not walk over the past two weeks as planned.

## 2022-10-28 ENCOUNTER — Ambulatory Visit: Payer: Medicaid Other | Admitting: "Endocrinology

## 2022-10-30 ENCOUNTER — Telehealth: Payer: Self-pay

## 2022-10-30 ENCOUNTER — Ambulatory Visit: Payer: Medicaid Other

## 2022-10-30 DIAGNOSIS — Z0000 Encounter for general adult medical examination without abnormal findings: Secondary | ICD-10-CM

## 2022-10-30 NOTE — Progress Notes (Signed)
Appointment Outcome: Completed, Session #: 6                        Start time: 4:07pm    End time: 4:24pm   Total Mins: 17 minutes     AGREEMENTS SECTION Health coaching session ended early due to patient having someone to come visit.    Overall Goal(s): Improve healthy eating habits   Agreement/Action Steps:  Eat no later than 8pm nightly Replace noodles and chips with fruit/vegetables as snacks Read food labels to monitor sodium and sugar intake Drink water instead of soda    Progress Notes:  Patient reported that he just purchased lo mein noodles with beef and broccoli. Patient stated that he will not eat until later since he is not hungry. Patient shared that he has not eaten today and that he typically eats daily around 9pm due to when he wakes up during the afternoon and when he going to bed after midnight around 2-4am. Discussed with patient about his aim to eat before 8pm nightly. Patient stated that he was okay with the current time that he eats because he does not go to bed right after eating.   Patient shared that taken Ozempic has changed his appetite, which reduces the volume of food that he can eat at one time. Patient shared that he ate half of a salad from [place] recently. Patient shared that he eats almost half of any food that he eats. Patient reports eating once per day. Patient stated that he hasn't two his shot in two weeks because he needed to pick up his prescription. Patient reported that his appetite did not increase during this time.   Discussed with patient about reading food labels. Patient stated that he has not read food labels in the past two weeks because he has not shopped for groceries. Asked patient what a reasonable amount of sodium in his meals per serving would be to aim for 1500 mg of sodium per day. Patient stated that if he eats at least twice daily, between 500-800 mg of sodium per serving would be reasonable. Patient stated that he has not eaten  chips or Raman noodles as he was before and cut down on how much he eats of it at once.   Patient named the sodas that he typically drinks (e.g., Pepsi, Dr. Reino Kent, Root Beer, and orange soda). Patient stated that he has not purchase Gatorade to drink in the past two weeks. Reviewed with patient the amount of sugar that these sodas contain. Patient stated that he does drink water mainly and does not consume an entire 12 oz soda in one day. Discussed with patient the effects of sugar consumption on his blood glucose. Patient stated that he checks his glucose levels regularly and typically drink sugar or have something sweet when it is too low.  Patient mentioned that he continues to purchase vegetable trays to have as snacks. Discussed with patient the importance of reading food labels for him. Patient stated that he will continue to read food labels to monitor his sodium intake and will start monitoring his sugar consumption. Reviewed with patient how to monitor sugar content in food as well as his drinks.   Indicators of Success and Accountability: Patient is practicing portion control to manage his sodium and sugar consumption.   Readiness: Patient is in the contemplation stage of improving healthy eating habits.   Strengths and Supports: Patient does not have support. Patient is  relying on being mindful of his portion sizes.   Challenges and Barriers: Patient does not like cooking regularly and eat fast food which is challenging to determine the amount of sodium and sugar the food contains.     Coaching Outcomes: Patient has completed the bi-weekly sessions and has moved into the follow up phase of health coaching.   Patient will continue to practice portion control and implement the remaining of his action steps except for focusing on eating before 8pm nightly.      Attempted: Partial - Patient periodically replaced noodles and chips with vegetables, read food labels to monitor sodium  and sugar, and drinking water instead of soda.   Not met - Patient has not been eating prior to 8pm nightly.    Not attempted: Dropped/Revised - patient determined that he will not focus on the timing of his meal at night but wants to continue focusing on practicing portion control.

## 2022-10-30 NOTE — Telephone Encounter (Signed)
Called patient for health coaching appointment. Patient was not available to talk at the time due to being around another individual and preferred that I called back in 15-20 minutes since he was on his way home. Will call patient back as requested.    Renaee Munda, MS, ERHD, [facility]  Care Guide, Health & Wellness Coach [address] [place] [address] Telephone: [phone] Email: [email or web address]

## 2022-10-30 NOTE — Telephone Encounter (Signed)
Called patient's social worker, Ether Griffins, to inform him of the patient's upcoming appointment on 10/4 at 2:00pm. Patient will be brought to his appointment by [facility].   Renaee Munda, MS, ERHD, [facility]  Care Guide, Health & Wellness Coach [address] [place] [address] Telephone: [phone] Email: [email or web address]

## 2022-11-21 LAB — LAB REPORT - SCANNED
A1c: 6.2
Albumin, Urine POC: 5.2
Creatinine, POC: 232 mg/dL
EGFR: 92
Microalb Creat Ratio: 22

## 2022-11-28 ENCOUNTER — Ambulatory Visit: Payer: Medicaid Other | Attending: Cardiology

## 2022-11-28 VITALS — Wt >= 6400 oz

## 2022-11-28 DIAGNOSIS — Z0000 Encounter for general adult medical examination without abnormal findings: Secondary | ICD-10-CM

## 2022-11-28 NOTE — Progress Notes (Signed)
Appointment Outcome: Completed, Session #: 10-month f/u                         Start time: 2:02pm   End time: 2:35pm   Total Mins: 33 minutes  AGREEMENTS SECTION   Overall Goal(s): Improve healthy eating habits   Agreement/Action Steps:  Eat no later than 8pm nightly Replace noodles and chips with fruit/vegetables as snacks Read food labels to monitor sodium and sugar intake Drink water instead of soda    Progress Notes:  Patient weighed in at the beginning of the visit at 400 lbs. Patient stated that he has gained 6 lbs. since his last visit at another doctor's office approximately a week ago when he weighs 394 lbs. Discussed with a patient what changed with his eating habits.   Patient stated that he has been eating cereal and frozen meals recently. Patient shared that he was cooking but food was going to waste because he doesn't eat much at a time and food spoiled. Patient mentioned that he purchased salads and they have gone bad. Patient stated that he was eating two of the frozen meals because they are small but read the food label and recognized they contained over 900mg  of sodium each. Patient stated that he chooses to eat the cereal instead. Patient mentioned that he skips dinner on some occasions because he will fall asleep (still around 12am - 1am).  Patient stated that he has eaten fast food on some occasions for lunch and dinner. Patient stated that his choice of meal was a cube steak like sandwich. Patient stated that he continues to drink soda but chooses when he does. Patient mentioned that the days he eats cereal, he does not drink soda to monitor his sugar consumption. Patient stated that water is his main source of hydration. Patient stated that he has not been drinking Gatorade.    Indicators of Success and Accountability: Patient read food label and made a choice to reduce his portion/serving size that he was consuming based on sodium content.   Readiness: Patient is in  action stage of improving healthy eating habits.   Strengths and Supports: Patient is being supported by LCSW. Patient is relying on being optimistic.   Challenges and Barriers: Patient's focus in on increasing movement and not changing eating habits. Patient states that he has less of an appetite, which impacts his desire to cook a meal.   Coaching Outcomes: Discussed with patient what do he want to focus on moving forward regarding his health. Patient stated that he doesn't feel that his eating habits are an issue because he doesn't eat much or often. Patient expressed that he needs to exercise and wants to get back to the [facility].  Patient's LCSW was present during this visit. We all discussed a plan regarding getting patients scheduled for transportation to the [facility] and signed up for a membership. Patient stated that he has no issues with paying for the membership since he qualified for the discount. LCSW agreed to work on transportation concern and may need a medical note stating that transportation is needed for patient for therapeutic/medical purposes for patient's weight loss goal.  Discussed patient's challenge to prepare home cooked meals that he will eat that will not sit in the refrigerator and spoil. Patient stated that he needs a variety of recipes to try that he would like. Discussed meal prepping and what a potential schedule might look like for him (e.g., Saturday,  Tuesday, and Thursday).  Patient has been scheduled for his 73-month follow up to check on progress towards active gym engagement. Patient did not revise his healthy eating habits action plan.     Attempted: Fulfilled - Patient is reading food labels to monitor sodium and sugar intake.   Not met - Patient continues to eat later than 8pm nightly and drink soda.    Not discussed: Deferred - We did not discuss replacing noodles and chips with fruit/vegetables as snacks.

## 2022-12-04 ENCOUNTER — Ambulatory Visit (INDEPENDENT_AMBULATORY_CARE_PROVIDER_SITE_OTHER): Payer: Medicaid Other | Admitting: Gastroenterology

## 2022-12-04 VITALS — BP 128/75 | HR 85 | Temp 97.5°F | Ht 72.0 in | Wt 399.3 lb

## 2022-12-04 DIAGNOSIS — D509 Iron deficiency anemia, unspecified: Secondary | ICD-10-CM | POA: Diagnosis not present

## 2022-12-04 NOTE — Patient Instructions (Signed)
Please continue with protonix 40mg  daily and iron pill once daily I will obtain your most recent labs for review You are due for repeat upper endoscopy next month, if iron levels are low again, we will plan to repeat pill camera endoscopy as well at time of upper endoscopy Our office will reach out to schedule you for the upper endoscopy next month, once I have reviewed these labs  Follow up 6 months  It was a pleasure to see you today. I want to create trusting relationships with patients and provide genuine, compassionate, and quality care. I truly value your feedback! please be on the lookout for a survey regarding your visit with me today. I appreciate your input about our visit and your time in completing this!    Clinten Howk L. Jeanmarie Hubert, MSN, APRN, AGNP-C Adult-Gerontology Nurse Practitioner [facility] Gastroenter[facility] at [facility]

## 2022-12-04 NOTE — Progress Notes (Addendum)
Referring Provider: Waldon Reining, MD Primary Care Physician:  Waldon Reining, MD Primary GI Physician: Dr. Levon Hedger   Chief Complaint  Patient presents with   Follow-up    For anemia   HPI:   Melvin Ray is a 37 y.o. male with past medical history of  DM, HTN, morbid obesity, Sleep apnea, CKD stage 3a.   Patient presenting today for follow up of IDA   Last seen June 2023, at that time here for IDA, pt reported low blood counts for the past few years. No rectal bleeding or melena. Having some nausea, heartburn better since starting protonix. Taking daily iron pill.   Recommended continue PPI, oral iron, schedule EGD/colonoscopy, avoid NSAIDs.  EGD and Colonoscopy as outlined below, given no findings, patient underwent givens capsule study as well.   Present: Last labs with hgb 12.1 in March. States he had some labs done with [facility] last week, also has routine labs with Dr. Wolfgang Phoenix. He is unsure of labs results. Still taking his iron pill once daily. No rectal bleeding or melena. States he doesn't have much of an appetite, he is taking ozempic. Had some nausea when he first began but none now. No constipation or diarrhea. No SOB, dizziness or fatigue.   Capsule study: 01/2022 Delayed gastric emptying No large gross abnormalities were found in the small bowel lining. However, this was limited by fair preparation in the small bowel.   We will monitor CBC and iron stores in clinic for now.  If worsening anemia or iron stores, will repeat capsule endoscopy with endoscopic deployment - he will also need to be on liquid diet for 3 days Last Colonoscopy:12/2021- Diverticulosis in the sigmoid colon.                           - Non-bleeding internal hemorrhoids.                           - No specimens collected.  Last Endoscopy: 12/2021 - Z-line regular, 42 cm from the incisors.                           - Gastroesophageal flap valve classified as Hill                             Grade III (minimal fold, loose to endoscope, hiatal                            hernia likely).                           - Two gastric polyps. Biopsied.                           - Normal examined duodenum. Biopsied.  A. SMALL BOWEL, BIOPSY:  - Duodenal mucosa with normal villous architecture.  - No villous atrophy or increased intraepithelial lymphocytes.   B. STOMACH, POLYPECTOMY:  Hyperplastic gastric polyp.   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   SVT (supraventricular tachycardia)    Tobacco abuse     Past Surgical History:  Procedure Laterality Date   APPENDECTOMY     BIOPSY  01/07/2022   Procedure: BIOPSY;  Surgeon: Dolores Frame, MD;  [facility];  Service: Gastroenterology;;   COLONOSCOPY WITH PROPOFOL N/A 01/07/2022   Procedure: COLONOSCOPY WITH PROPOFOL;  Surgeon: Dolores Frame, MD;  [facility];  Service: Gastroenterology;  Laterality: N/A;  1030 ASA 3   ESOPHAGOGASTRODUODENOSCOPY  01/07/2022   Procedure: ESOPHAGOGASTRODUODENOSCOPY (EGD);  Surgeon: Marguerita Merles, Reuel Boom, MD;  [facility];  Service: Gastroenterology;;   GIVENS CAPSULE STUDY N/A 02/05/2022   Procedure: GIVENS CAPSULE STUDY;  Surgeon: Dolores Frame, MD;  [facility];  Service: Gastroenterology;  Laterality: N/A;  8:30am    Current Outpatient Medications  Medication Sig Dispense Refill   ACCU-CHEK GUIDE test strip 3 (three) times daily.     Accu-Chek Softclix Lancets lancets 3 (three) times daily.     albuterol (VENTOLIN HFA) 108 (90 Base) MCG/ACT inhaler Inhale 1-2 puffs into the lungs every 6 (six) hours as needed for wheezing  or shortness of breath.     amLODipine (NORVASC) 2.5 MG tablet Take 1 tablet (2.5 mg total) by mouth daily. (Patient taking differently: Take 5 mg by mouth daily.) 90 tablet 3   chlorthalidone (HYGROTON) 25 MG tablet TAKE 1 TABLET ONCE DAILY. 90 tablet 1   Cholecalciferol (VITAMIN D3) 50 MCG (2000 UT) TABS Take 2,000 Units by mouth daily.     cloNIDine (CATAPRES) 0.3 MG tablet Take 0.3 mg by mouth 3 (three) times daily.     diltiazem (CARDIZEM CD) 300 MG 24 hr capsule Take 300 mg by mouth daily.     FLUoxetine (PROZAC) 20 MG capsule Take 20 mg by mouth daily.     hydrALAZINE (APRESOLINE) 100 MG tablet Take 100 mg by mouth 3 (three) times daily.     losartan (COZAAR) 100 MG tablet Take 100 mg by mouth at bedtime.     metFORMIN (GLUCOPHAGE-XR) 500 MG 24 hr tablet Take 1,000 mg by mouth 2 (two) times daily with breakfast and lunch.     metoprolol (TOPROL-XL) 200 MG 24 hr tablet Take 200 mg by mouth daily.     NON FORMULARY Pt uses a cpap nightly     pantoprazole (PROTONIX) 40 MG tablet Take 40 mg by mouth daily.     potassium chloride (KLOR-CON) 10 MEQ tablet Take 10 mEq by mouth daily.     Semaglutide, 2 MG/DOSE, (OZEMPIC, 2 MG/DOSE,) 8 MG/3ML SOPN Inject 2 mg into the skin every 7 (seven) days. 3 mL 1   Semaglutide, 2 MG/DOSE, (OZEMPIC, 2 MG/DOSE,) 8 MG/3ML SOPN Inject 2 mg into the skin every 7 (seven) days. 3 mL 3   traZODone (DESYREL) 100 MG tablet Take 200 mg by mouth at bedtime.      FEROSUL 325 (65 Fe) MG tablet Take 325 mg by mouth daily.     Fluticasone-Salmeterol (ADVAIR) 250-50 MCG/DOSE AEPB Inhale 1 puff into the lungs daily.     insulin glargine (LANTUS SOLOSTAR) 100 UNIT/ML Solostar Pen Inject 50 Units into the skin at bedtime. (Patient not taking: Reported on 12/04/2022) 30 mL 1   No current facility-administered medications for this visit.    Allergies as of 12/04/2022   (No Known Allergies)    Family History  Problem  Relation Age of Onset   Heart failure Father     Hypertension Father    Diabetes Maternal Grandmother    Heart failure Paternal Grandmother        transplant    Social History   Socioeconomic History   Marital status: Single    Spouse name: Not on file   Number of children: Not on file   Years of education: Not on file   Highest education level: Not on file  Occupational History   Not on file  Tobacco Use   Smoking status: Some Days    Types: Cigars    Passive exposure: Current   Smokeless tobacco: Former  Building services engineer status: Every Day  Substance and Sexual Activity   Alcohol use: Never   Drug use: Never   Sexual activity: Not on file  Other Topics Concern   Not on file  Social History Narrative   Not on file   Social Determinants of Health   Financial Resource Strain: Medium Risk (03/24/2022)   Overall Financial Resource Strain (CARDIA)    Difficulty of Paying Living Expenses: Somewhat hard  Food Insecurity: No Food Insecurity (03/24/2022)   Hunger Vital Sign    Worried About Running Out of Food in the Last Year: Never true    Ran Out of Food in the Last Year: Never true  Transportation Needs: No Transportation Needs (03/24/2022)   PRAPARE - Administrator, Civil Service (Medical): No    Lack of Transportation (Non-Medical): No  Physical Activity: Insufficiently Active (07/31/2020)   Exercise Vital Sign    Days of Exercise per Week: 3 days    Minutes of Exercise per Session: 40 min  Stress: Not on file  Social Connections: Not on file    Review of systems General: negative for malaise, night sweats, fever, chills, weight loss Neck: Negative for lumps, goiter, pain and significant neck swelling Resp: Negative for cough, wheezing, dyspnea at rest CV: Negative for chest pain, leg swelling, palpitations, orthopnea GI: denies melena, hematochezia, nausea, vomiting, diarrhea, constipation, dysphagia, odyonophagia, early satiety or unintentional weight loss.  MSK: Negative for joint pain or  swelling, back pain, and muscle pain. Derm: Negative for itching or rash Psych: Denies depression, anxiety, memory loss, confusion. No homicidal or suicidal ideation.  Heme: Negative for prolonged bleeding, bruising easily, and swollen nodes. Endocrine: Negative for cold or heat intolerance, polyuria, polydipsia and goiter. Neuro: negative for tremor, gait imbalance, syncope and seizures. The remainder of the review of systems is noncontributory.  Physical Exam: BP 128/75 (BP Location: Right Arm, Patient Position: Sitting, Cuff Size: Large)   Pulse 85   Temp (!) 97.5 F (36.4 C) (Oral)   Ht 6' (1.829 m)   Wt (!) 399 lb 4.8 oz (181.1 kg)   BMI 54.15 kg/m  General:   Alert and oriented. No distress noted. Pleasant and cooperative.  Head:  Normocephalic and atraumatic. Eyes:  Conjuctiva clear without scleral icterus. Mouth:  Oral mucosa pink and moist. Good dentition. No lesions. Heart: Normal rate and rhythm, s1 and s2 heart sounds present.  Lungs: Clear lung sounds in all lobes. Respirations equal and unlabored. Abdomen:  +BS, soft, non-tender and non-distended. No rebound or guarding. No HSM or masses noted. Derm: No palmar erythema or jaundice Msk:  Symmetrical without gross deformities. Normal posture. Extremities:  Without edema. Neurologic:  Alert and  oriented x4 Psych:  Alert and cooperative. Normal mood and affect.  Invalid input(s): "6 MONTHS"  ASSESSMENT: Tosh Glaze is a 37 y.o. male presenting today for follow up of IDA  IDA since atleast last 2020, no rectal bleeding or melena. Maintained on Iron PO once daily. Recent EGD, Colonoscopy and givens capsule study without findings to explain his IDA. No recent CBC or iron studies in chart for review, though he has had some labs done recently which I will obtain for further review, will need to update iron studies if these have not been checked recently. He is due for repeat EGD next month due to findings of hyperplastic polyp  in the stomach on last EGD, if IDA persists, will plan to drop givens capsule at time of EGD for repeat evaluation.  He should remain on PO iron and PPI daily for now.    PLAN:  Obtain labs from [facility]/Dr. Wolfgang Phoenix  2. Continue Protonix 40mg   3. repeat capsule study with endoscopic deployment if IDA persist 4. Schedule EGD after labs reviewed  5. Continue once daily iron  All questions were answered, patient verbalized understanding and is in agreement with plan as outlined above.    Follow Up: 6 months  Rendell Thivierge L. Jeanmarie Hubert, MSN, APRN, AGNP-C Adult-Gerontology Nurse Practitioner [facility]  I have reviewed the note and agree with the APP's assessment as described in this progress note  Katrinka Blazing, MD Gastroenterology and Hepatology [facility] Gastroenterology

## 2022-12-17 ENCOUNTER — Telehealth: Payer: Self-pay

## 2022-12-17 NOTE — Telephone Encounter (Signed)
Raquel Rubin, NP  Metro Kung, LPN; Joanne Gavel, Avyonna Wagoner L, CMA Hemoglobin still mildly low at 11.8. no iron studies checked by PCP, can we have him have these drawn (needs iron studies including ferritin) not sure if he prefers [facility] or [facility]

## 2022-12-17 NOTE — Telephone Encounter (Signed)
Tried calling the patient no answer and voice mail not set up.

## 2022-12-19 ENCOUNTER — Encounter (INDEPENDENT_AMBULATORY_CARE_PROVIDER_SITE_OTHER): Payer: Self-pay | Admitting: *Deleted

## 2022-12-19 ENCOUNTER — Other Ambulatory Visit (INDEPENDENT_AMBULATORY_CARE_PROVIDER_SITE_OTHER): Payer: Self-pay

## 2022-12-19 DIAGNOSIS — D509 Iron deficiency anemia, unspecified: Secondary | ICD-10-CM

## 2022-12-19 NOTE — Telephone Encounter (Signed)
Orders mailed to the patient.

## 2022-12-19 NOTE — Telephone Encounter (Signed)
Hemoglobin still mildly low at 11.8. no iron studies checked by PCP, can we have him have these drawn (needs iron studies including ferritin) not sure if he prefers [facility] or [facility]. Patient made aware and states understanding he will have the lab drawn at [facility]. Orders placed.

## 2023-02-02 ENCOUNTER — Telehealth: Payer: Self-pay

## 2023-02-02 ENCOUNTER — Ambulatory Visit: Payer: Medicaid Other

## 2023-02-02 DIAGNOSIS — Z0000 Encounter for general adult medical examination without abnormal findings: Secondary | ICD-10-CM

## 2023-02-02 NOTE — Telephone Encounter (Signed)
Returned phone call per request by patient's social worker to reschedule patient's appointment. Patient has been scheduled for 9/16 at 1:30pm for an in-person health coaching appointment.     Renaee Munda, MS, ERHD, [facility]  Care Guide, Health & Wellness Coach [address] [place] [address] Telephone: [phone] Email: [email or web address]

## 2023-02-09 ENCOUNTER — Ambulatory Visit: Payer: Medicaid Other | Attending: Cardiovascular Disease

## 2023-02-09 VITALS — Wt >= 6400 oz

## 2023-02-09 DIAGNOSIS — Z0000 Encounter for general adult medical examination without abnormal findings: Secondary | ICD-10-CM

## 2023-02-09 NOTE — Progress Notes (Signed)
Appointment Outcome: Completed, Session #: 38-month f/u                        Start time: 1:38pm   End time: 2:21pm   Total Mins: 43 minutes  AGREEMENTS SECTION   Overall Goal(s): Improve healthy eating habits   Agreement/Action Steps:  Eat no later than 8pm nightly Replace noodles and chips with fruit/vegetables as snacks Read food labels to monitor sodium and sugar intake Drink water instead of soda    Progress Notes:  Patient stated that he hasn't weighed himself in a while but feels lighter. Patient requested to be weighed at the end of the appointment. Patient weighed 399 lbs. during his last visit on 10/10. Patient reported that he hasn't been to the gym because the application for assistance for the [employer] has not been completed and he still doesn't have reliable transportation. Patient shared that he is paying $20-30 when he has someone take him to the grocery store.   Patient shared that he paid someone to cook food for him on Thanksgiving that he was able to eat from for 7 days. Patient stated that he ate ham, [place], mac and cheese, potato salad (store bought), and stuffing. Patient reported that at other times, he eats chicken patties, tacos and taco salads, spaghetti, and other sandwiches. Patient stated that when he wakes up in the evening, he will eat what he had the night before for dinner. Patient stated the latest that he will eat is able 12-1am in the morning before going to bed at 6am.   Patient mentioned that he snacks on foods such as almonds and pecans, pickled eggs and sausages, sometimes chocolate. Discussed with patient if he is reading the labels on his foods to determine the amount of sodium and sugar they contain. Patient stated that he was not. Patient expressed that knowing of variety of things that he can eat and feeling that he does not have many options to choose from are the challenges to him continuing to eat the same foods that are quick to prep. Patient  stated that he has friends that will bring him food as well, but only one person to take him grocery shopping.  Patient shared that he eats no more than two meals per day and sometimes does not eat dinner. Patient stated that he continues to take his Ozempic, which continues to restrict how much he eats at one time. Patient reported that he typically drinks 3 (32oz) cups of water per day, but still enjoy some soda. Discussed with patient the importance of reading food labels to help him make the best food choices possible.   Indicators of Success and Accountability:  Patient stated that he has been trying to cut back on the number of servings that he eats and not eating certain foods on a regular basis.  Readiness: Patient is in the action stage of improving healthy eating habits.   Strengths and Supports: Patient has various friends that he is relying on to prepare healthy meals for him. Patient is relying on being sustainable.  Challenges and Barriers: Patient continues to have challenges with transportation to the gym and having to pay someone to take him grocery shopping.    Coaching Outcomes: Patient weighed in at 403 lbs.  Assisted patient with completing the online assistance application for the [facility] Patient will focus on next steps per organization's guidelines and secure transportation to the gym.   Increase physical activity  Complete [employer] financial assistance application (any additional information organization may request) Apply through insurance for transportation to gym   Patient will continue to work on implementing plan over the next three months as outlined above to improve healthy eating habits. Discussed with patient about healthy snack options.  Patient expressed that finding alternative food ideas would be helpful in changing his eating habits.   Attempted: Partial - Patient did not replace noodles and chips with fruits and vegetables over the past two months.  Patient is not consistently reading food labels to monitor sugar and sodium intake. Patient continues to drink soda in addition to water.  Not met - Patient continues to eat later than 8pm daily.    Referrals: Mailed patient information on mindful eating, pamphlet from [employer] on how to Eat Better for Less, healthy fats, and healthy snack sheet.

## 2023-03-11 ENCOUNTER — Encounter: Payer: Self-pay | Admitting: Podiatry

## 2023-03-11 ENCOUNTER — Ambulatory Visit (INDEPENDENT_AMBULATORY_CARE_PROVIDER_SITE_OTHER): Payer: Medicaid Other | Admitting: Podiatry

## 2023-03-11 DIAGNOSIS — B351 Tinea unguium: Secondary | ICD-10-CM

## 2023-03-11 DIAGNOSIS — M79675 Pain in left toe(s): Secondary | ICD-10-CM

## 2023-03-11 DIAGNOSIS — M79674 Pain in right toe(s): Secondary | ICD-10-CM

## 2023-03-11 DIAGNOSIS — E1159 Type 2 diabetes mellitus with other circulatory complications: Secondary | ICD-10-CM

## 2023-03-11 NOTE — Progress Notes (Signed)
This patient returns to my office for at risk foot care.  This patient requires this care by a professional since this patient will be at risk due to having diabetes.   This patient is unable to cut nails himself since the patient cannot reach his nails.These nails are painful walking and wearing shoes.  This patient presents for at risk foot care today. His caregiver states he has developed a painful callus on the inside of left foot. He is brought to the office by caregiver from  [place].  General Appearance  Alert, conversant and in no acute stress.  Vascular  Dorsalis pedis and posterior tibial  pulses are not  palpable  Bilaterally due to swelling..  Capillary return is within normal limits  bilaterally. Temperature is within normal limits  bilaterally.  Neurologic  Senn-Weinstein monofilament wire test within normal limits  bilaterally. Muscle power within normal limits bilaterally.  Nails Thick disfigured discolored nails with subungual debris  from hallux to fifth toes bilaterally. No evidence of bacterial infection or drainage bilaterally.  Orthopedic  No limitations of motion  feet .  No crepitus or effusions noted.  No bony pathology or digital deformities noted.  Skin  normotropic skin with no porokeratosis noted bilaterally.  No signs of infections or ulcers noted.    Onychomycosis  Pain in right toes  Pain in left toes  Consent was obtained for treatment procedures.   Mechanical debridement of nails 1-5  bilaterally performed with a nail nipper.  Filed with dremel without incident.    Return office visit   3 months                 Told patient to return for periodic foot care and evaluation due to potential at risk complications.   Domitila Stetler [name]  

## 2023-03-17 ENCOUNTER — Encounter: Payer: Self-pay | Admitting: "Endocrinology

## 2023-03-17 ENCOUNTER — Ambulatory Visit (INDEPENDENT_AMBULATORY_CARE_PROVIDER_SITE_OTHER): Payer: Medicaid Other | Admitting: "Endocrinology

## 2023-03-17 VITALS — BP 134/88 | HR 72 | Ht 72.0 in | Wt >= 6400 oz

## 2023-03-17 DIAGNOSIS — Z794 Long term (current) use of insulin: Secondary | ICD-10-CM

## 2023-03-17 DIAGNOSIS — E1122 Type 2 diabetes mellitus with diabetic chronic kidney disease: Secondary | ICD-10-CM

## 2023-03-17 DIAGNOSIS — N182 Chronic kidney disease, stage 2 (mild): Secondary | ICD-10-CM | POA: Diagnosis not present

## 2023-03-17 DIAGNOSIS — Z7985 Long-term (current) use of injectable non-insulin antidiabetic drugs: Secondary | ICD-10-CM

## 2023-03-17 DIAGNOSIS — I10 Essential (primary) hypertension: Secondary | ICD-10-CM

## 2023-03-17 DIAGNOSIS — E782 Mixed hyperlipidemia: Secondary | ICD-10-CM

## 2023-03-17 DIAGNOSIS — E559 Vitamin D deficiency, unspecified: Secondary | ICD-10-CM | POA: Insufficient documentation

## 2023-03-17 DIAGNOSIS — F17200 Nicotine dependence, unspecified, uncomplicated: Secondary | ICD-10-CM

## 2023-03-17 LAB — POCT GLYCOSYLATED HEMOGLOBIN (HGB A1C): HbA1c, POC (controlled diabetic range): 6.6 % (ref 0.0–7.0)

## 2023-03-17 NOTE — Patient Instructions (Signed)
                                     Advice for Weight Management  -For most of us the best way to lose weight is by diet management. Generally speaking, diet management means consuming less calories intentionally which over time brings about progressive weight loss.  This can be achieved more effectively by avoiding ultra processed carbohydrates, processed meats, unhealthy fats.    It is critically important to know your numbers: how much calorie you are consuming and how much calorie you need. More importantly, our carbohydrates sources should be unprocessed naturally occurring  complex starch food items.  It is always important to balance nutrition also by  appropriate intake of proteins (mainly plant-based), healthy fats/oils, plenty of fruits and vegetables.   -The American College of Lifestyle Medicine (ACL M) recommends nutrition derived mostly from Whole Food, Plant Predominant Sources example an apple instead of applesauce or apple pie. Eat Plenty of vegetables, Mushrooms, fruits, Legumes, Whole Grains, Nuts, seeds in lieu of processed meats, processed snacks/pastries red meat, poultry, eggs.  Use only water or unsweetened tea for hydration.  The College also recommends the need to stay away from risky substances including alcohol, smoking; obtaining 7-9 hours of restorative sleep, at least 150 minutes of moderate intensity exercise weekly, importance of healthy social connections, and being mindful of stress and seek help when it is overwhelming.    -Sticking to a routine mealtime to eat 3 meals a day and avoiding unnecessary snacks is shown to have a big role in weight control. Under normal circumstances, the only time we burn stored energy is when we are hungry, so allow  some hunger to take place- hunger means no food between appropriate meal times, only water.  It is not advisable to starve.   -It is better to avoid simple carbohydrates including:  Cakes, Sweet Desserts, Ice Cream, Soda (diet and regular), Sweet Tea, Candies, Chips, Cookies, Store Bought Juices, Alcohol in Excess of  1-2 drinks a day, Lemonade,  Artificial Sweeteners, Doughnuts, Coffee Creamers, "Sugar-free" Products, etc, etc.  This is not a complete list.....    -Consulting with certified diabetes educators is proven to provide you with the most accurate and current information on diet.  Also, you may be  interested in discussing diet options/exchanges , we can schedule a visit with Melvin Ray, RDN, CDE for individualized nutrition education.  -Exercise: If you are able: 30 -60 minutes a day ,4 days a week, or 150 minutes of moderate intensity exercise weekly.    The longer the better if tolerated.  Combine stretch, strength, and aerobic activities.  If you were told in the past that you have high risk for cardiovascular diseases, or if you are currently symptomatic, you may seek evaluation by your heart doctor prior to initiating moderate to intense exercise programs.                                  Additional Care Considerations for Diabetes/Prediabetes   -Diabetes  is a chronic disease.  The most important care consideration is regular follow-up with your diabetes care provider with the goal being avoiding or delaying its complications and to take advantage of advances in medications and technology.  If appropriate actions are taken early enough, type 2 diabetes can even be   reversed.  Seek information from the right source.  - Whole Food, Plant Predominant Nutrition is highly recommended: Eat Plenty of vegetables, Mushrooms, fruits, Legumes, Whole Grains, Nuts, seeds in lieu of processed meats, processed snacks/pastries red meat, poultry, eggs as recommended by American College of  Lifestyle Medicine (ACLM).  -Type 2 diabetes is known to coexist with other important comorbidities such as high blood pressure and high cholesterol.  It is critical to control not only the  diabetes but also the high blood pressure and high cholesterol to minimize and delay the risk of complications including coronary artery disease, stroke, amputations, blindness, etc.  The good news is that this diet recommendation for type 2 diabetes is also very helpful for managing high cholesterol and high blood blood pressure.  - Studies showed that people with diabetes will benefit from a class of medications known as ACE inhibitors and statins.  Unless there are specific reasons not to be on these medications, the standard of care is to consider getting one from these groups of medications at an optimal doses.  These medications are generally considered safe and proven to help protect the heart and the kidneys.    - People with diabetes are encouraged to initiate and maintain regular follow-up with eye doctors, foot doctors, dentists , and if necessary heart and kidney doctors.     - It is highly recommended that people with diabetes quit smoking or stay away from smoking, and get yearly  flu vaccine and pneumonia vaccine at least every 5 years.  See above for additional recommendations on exercise, sleep, stress management , and healthy social connections.      

## 2023-03-17 NOTE — Progress Notes (Signed)
03/17/2023, 1:03 PM  Endocrinology follow-up note   Subjective:    Patient ID: Melvin Ray, male    DOB: 07-15-1985.  Melvin Ray is being seen in follow up after he was sen in consultation for management of currently uncontrolled symptomatic diabetes requested by  Waldon Reining, MD. He is assisted and accompanied by his case manager Johnny.   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   SVT (supraventricular tachycardia) (HCC)    Tobacco abuse     Past Surgical History:  Procedure Laterality Date   APPENDECTOMY     BIOPSY  01/07/2022   Procedure: BIOPSY;  Surgeon: Dolores Frame, MD;  Location: [facility];  Service: Gastroenterology;;   COLONOSCOPY WITH PROPOFOL N/A 01/07/2022   Procedure: COLONOSCOPY WITH PROPOFOL;  Surgeon: Dolores Frame, MD;  Location: [facility];  Service: Gastroenterology;  Laterality: N/A;  1030 ASA 3   ESOPHAGOGASTRODUODENOSCOPY  01/07/2022   Procedure: ESOPHAGOGASTRODUODENOSCOPY (EGD);  Surgeon: Marguerita Merles, Reuel Boom, MD;  Location: [facility];  Service: Gastroenterology;;   GIVENS CAPSULE STUDY N/A 02/05/2022   Procedure: GIVENS CAPSULE STUDY;  Surgeon: Dolores Frame, MD;  Location: [facility];  Service: Gastroenterology;  Laterality: N/A;  8:30am    Social History   Socioeconomic History   Marital status: Single    Spouse name: Not on file   Number of children: Not on file   Years of education: Not on file   Highest education level: Not on file  Occupational History   Not on file  Tobacco Use   Smoking status: Some Days    Types: Cigars    Passive exposure: Current   Smokeless tobacco: Former  Advertising account planner    Vaping status: Every Day  Substance and Sexual Activity   Alcohol use: Never   Drug use: Never   Sexual activity: Not on file  Other Topics Concern   Not on file  Social History Narrative   Not on file   Social Drivers of Health   Financial Resource Strain: Medium Risk (03/24/2022)   Overall Financial Resource Strain (CARDIA)    Difficulty of Paying Living Expenses: Somewhat hard  Food Insecurity: No Food Insecurity (03/24/2022)   Hunger Vital Sign    Worried About Running Out of Food in the Last Year: Never true    Ran Out of Food in the Last Year: Never true  Transportation Needs: No Transportation Needs (03/24/2022)   PRAPARE - Administrator, Civil Service (Medical): No    Lack of Transportation (Non-Medical): No  Physical Activity: Insufficiently Active (07/31/2020)   Exercise Vital Sign    Days of Exercise per Week: 3 days    Minutes of Exercise per Session: 40 min  Stress: Not on file  Social Connections: Not on file    Family History  Problem Relation Age of Onset  Heart failure Father    Hypertension Father    Diabetes Maternal Grandmother    Heart failure Paternal Grandmother        transplant    Outpatient Encounter Medications as of 03/17/2023  Medication Sig   ACCU-CHEK GUIDE test strip 3 (three) times daily.   Accu-Chek Softclix Lancets lancets 3 (three) times daily.   albuterol (VENTOLIN HFA) 108 (90 Base) MCG/ACT inhaler Inhale 1-2 puffs into the lungs every 6 (six) hours as needed for wheezing or shortness of breath.   amLODipine (NORVASC) 2.5 MG tablet Take 1 tablet (2.5 mg total) by mouth daily. (Patient taking differently: Take 5 mg by mouth daily.)   chlorthalidone (HYGROTON) 25 MG tablet TAKE 1 TABLET ONCE DAILY.   Cholecalciferol (VITAMIN D3) 50 MCG (2000 UT) TABS Take 2,000 Units by mouth daily.   cloNIDine (CATAPRES) 0.3 MG tablet Take 0.3 mg by mouth 3 (three) times daily.   diltiazem (CARDIZEM CD) 300 MG 24 hr capsule Take 300 mg by  mouth daily.   FEROSUL 325 (65 Fe) MG tablet Take 325 mg by mouth daily.   FLUoxetine (PROZAC) 20 MG capsule Take 20 mg by mouth daily.   Fluticasone-Salmeterol (ADVAIR) 250-50 MCG/DOSE AEPB Inhale 1 puff into the lungs daily.   hydrALAZINE (APRESOLINE) 100 MG tablet Take 100 mg by mouth 3 (three) times daily.   losartan (COZAAR) 100 MG tablet Take 100 mg by mouth at bedtime.   metFORMIN (GLUCOPHAGE-XR) 500 MG 24 hr tablet Take 1,000 mg by mouth 2 (two) times daily with breakfast and lunch.   metoprolol (TOPROL-XL) 200 MG 24 hr tablet Take 200 mg by mouth daily.   NON FORMULARY Pt uses a cpap nightly   pantoprazole (PROTONIX) 40 MG tablet Take 40 mg by mouth daily.   potassium chloride (KLOR-CON) 10 MEQ tablet Take 10 mEq by mouth daily.   Semaglutide, 2 MG/DOSE, (OZEMPIC, 2 MG/DOSE,) 8 MG/3ML SOPN Inject 2 mg into the skin every 7 (seven) days.   traZODone (DESYREL) 100 MG tablet Take 200 mg by mouth at bedtime.    [DISCONTINUED] insulin glargine (LANTUS SOLOSTAR) 100 UNIT/ML Solostar Pen Inject 50 Units into the skin at bedtime. (Patient not taking: Reported on 12/04/2022)   [DISCONTINUED] Semaglutide, 2 MG/DOSE, (OZEMPIC, 2 MG/DOSE,) 8 MG/3ML SOPN Inject 2 mg into the skin every 7 (seven) days.   No facility-administered encounter medications on file as of 03/17/2023.    ALLERGIES: No Known Allergies  VACCINATION STATUS: Immunization History  Administered Date(s) Administered   Influenza Split 02/15/2019   Tdap 07/16/2015    Diabetes He presents for his follow-up diabetic visit. He has type 2 diabetes mellitus. Onset time: He was diagnosed through approximate age of 31 years. His disease course has been improving. There are no hypoglycemic associated symptoms. Pertinent negatives for hypoglycemia include no headaches, seizures or tremors. Pertinent negatives for diabetes include no blurred vision, no chest pain, no polydipsia, no polyuria and no weight loss. There are no hypoglycemic  complications. Symptoms are improving. Diabetic complications include nephropathy. Risk factors for coronary artery disease include dyslipidemia, diabetes mellitus, family history, male sex, obesity, hypertension, tobacco exposure and sedentary lifestyle. Current diabetic treatment includes insulin injections. His weight is decreasing steadily. He is following a generally unhealthy diet. When asked about meal planning, he reported none. He has not had a previous visit with a dietitian. He never participates in exercise. His home blood glucose trend is decreasing steadily. His breakfast blood glucose range is generally 130-140 mg/dl. His  bedtime blood glucose range is generally 130-140 mg/dl. His overall blood glucose range is 130-140 mg/dl. Melvin Ray presents with significantly improved glycemic profile.  He is accompanied by his case Production designer, theatre/television/film.  His meter average is 134-142 mg per DL for the last 14 days.  His point-of-care A1c is 6.6%, progressively improving from 10.2%.  He did not document hypoglycemia.  ) An ACE inhibitor/angiotensin II receptor blocker is being taken. He does not see a podiatrist.Eye exam is not current.  Hyperlipidemia This is a chronic problem. The current episode started more than 1 year ago. Exacerbating diseases include chronic renal disease, diabetes and obesity. Pertinent negatives include no chest pain, myalgias or shortness of breath. Risk factors for coronary artery disease include dyslipidemia, diabetes mellitus, family history, obesity, male sex, hypertension and a sedentary lifestyle.  Hypertension This is a chronic problem. The current episode started more than 1 year ago. Pertinent negatives include no blurred vision, chest pain, headaches, palpitations or shortness of breath. Risk factors for coronary artery disease include dyslipidemia, diabetes mellitus, family history, obesity, male gender, sedentary lifestyle and smoking/tobacco exposure. Past treatments include central  alpha agonists and angiotensin blockers. Identifiable causes of hypertension include chronic renal disease.     Review of Systems  Constitutional:  Negative for chills, fever and weight loss.  Eyes:  Negative for blurred vision.  Respiratory:  Negative for cough and shortness of breath.   Cardiovascular:  Negative for chest pain and palpitations.       No Shortness of breath  Gastrointestinal:  Negative for abdominal pain, diarrhea, nausea and vomiting.  Endocrine: Negative for polydipsia and polyuria.  Genitourinary:  Negative for frequency, hematuria and urgency.  Musculoskeletal:  Negative for myalgias.  Skin:  Negative for rash.  Neurological:  Negative for tremors, seizures and headaches.  Hematological:  Does not bruise/bleed easily.  Psychiatric/Behavioral:  Negative for hallucinations and suicidal ideas.     Objective:       03/17/2023   11:37 AM 03/17/2023   10:56 AM 02/09/2023    2:27 PM  Vitals with BMI  Height  6\' 0"    Weight  406 lbs 10 oz 403 lbs  BMI  55.13   Systolic 134 140   Diastolic 88 96   Pulse  72     BP 134/88 Comment: L arm with manual cuff. Dr.Janet Humphreys made aware.  Pulse 72   Ht 6' (1.829 m)   Wt (!) 406 lb 9.6 oz (184.4 kg)   BMI 55.14 kg/m   Wt Readings from Last 3 Encounters:  03/17/23 (!) 406 lb 9.6 oz (184.4 kg)  02/09/23 (!) 403 lb (182.8 kg)  12/04/22 (!) 399 lb 4.8 oz (181.1 kg)       Diabetic Labs (most recent): Lab Results  Component Value Date   HGBA1C 6.6 03/17/2023   HGBA1C 7.4 (A) 06/25/2022   HGBA1C 10.2 07/23/2021   MICROALBUR 7.8 07/23/2021      Recent Results (from the past 2160 hours)  HgB A1c     Status: None   Collection Time: 03/17/23 11:10 AM  Result Value Ref Range   Hemoglobin A1C     HbA1c POC (<> result, manual entry)     HbA1c, POC (prediabetic range)     HbA1c, POC (controlled diabetic range) 6.6 0.0 - 7.0 %   Lipid Panel     Component Value Date/Time   CHOL 169 07/23/2021 0000   TRIG 264 (A)  07/23/2021 0000   HDL 36 07/23/2021 0000  LDLCALC 95 07/23/2021 0000     Assessment & Plan:   1. Type 2 diabetes mellitus with stage 2 chronic kidney disease, with long-term current use of insulin (HCC)   - Melvin Ray has currently uncontrolled symptomatic type 2 DM since  38 years of age. Recent labs reviewed.  Melvin Ray presents with significantly improved glycemic profile.  He is accompanied by his case Production designer, theatre/television/film.  His meter average is 134-142 mg per DL for the last 14 days.  His point-of-care A1c is 6.6%, progressively improving from 10.2%.  He did not document hypoglycemia.   - I had a long discussion with him about the progressive nature of diabetes and the pathology behind its complications. -his diabetes is complicated by CKD, obesity/sedentary life, smoking and he remains at a high risk for more acute and chronic complications which include CAD, CVA, CKD, retinopathy, and neuropathy. These are all discussed in detail with him.  - I have counseled him on diet  and weight management  by adopting a carbohydrate restricted/protein rich diet. Patient is encouraged to switch to  unprocessed or minimally processed     complex starch and increased protein intake (animal or plant source), fruits, and vegetables. -  he is advised to stick to a routine mealtimes to eat 3 meals  a day and avoid unnecessary snacks ( to snack only to correct hypoglycemia).  -He has regained most of the weight he lost when he was dealing with severe hyperglycemia with glycosuria.  - he acknowledges that there is a room for improvement in his food and drink choices. - Suggestion is made for him to avoid simple carbohydrates  from his diet including Cakes, Sweet Desserts, Ice Cream, Soda (diet and regular), Sweet Tea, Candies, Chips, Cookies, Store Bought Juices, Alcohol in Excess of  1-2 drinks a day, Artificial Sweeteners,  Coffee Creamer, and "Sugar-free" Products, Lemonade. This will help patient to have more stable  blood glucose profile and potentially avoid unintended weight gain.  - he has been  scheduled with Norm Salt, RDN, CDE for diabetes education.  - I have approached him with the following individualized plan to manage  his diabetes and patient agrees:   -In light of his presentation with significantly improved glycemic profile despite the fact that he stopped insulin, he will be continued without insulin for now.    He is advised to continue Ozempic 2 mg subcutaneously weekly, metformin 1000 mg p.o. twice daily.  He is willing and advised to monitor blood glucose at least once a day-daily before breakfast.      he is encouraged to call clinic for blood glucose levels less than 70 or above 200 mg /dl.  - he is warned not to take insulin without proper monitoring per orders.  -He is counseled for  smoking cessation, he is too high risk to's give incretin therapy due to his risk of pancreatitis.  Tragically, patient continues to smoke.  He has obstructive sleep apnea would benefit the most from weight loss and smoking cessation. The patient was counseled on the dangers of tobacco use, and was advised to quit.  Reviewed strategies to maximize success, including removing cigarettes and smoking materials from environment.  - Specific targets for  A1c;  LDL, HDL,  and Triglycerides were discussed with the patient.  2) Blood Pressure /Hypertension:  -His blood pressure is not controlled to target.  He typically does not take his blood pressure medications in the morning.       he is  advised to continue his current medications including  chlorthalidone 25 mg p.o. daily, clonidine 0.1 mg p.o. twice daily, hydralazine 50 mg p.o. 3 times daily, Losartan 100 mg p.o. every morning, metoprolol 200 mg p.o. daily.   Admittedly, he consumes salty food, advised to limit salt consumption.   3) Lipids/Hyperlipidemia: Recent lipid panel showed LDL still high at 95.  He would like to avoid statin  medications at this time.  He will be considered for repeat fasting lipid panel and will be reapproached for low-dose statin initiation on subsequent visits.    4)  Weight/Diet:  Body mass index is 55.14 kg/m.  -He has achieved 10+ pounds of weight loss since last visit.    he is  a candidate for modest weight loss. I discussed with him the fact that loss of 5 - 10% of his  current body weight will have the most impact on his diabetes management.  Exercise, and detailed carbohydrates information provided  -  detailed on discharge instructions.  5) Chronic Care/Health Maintenance:  -he  is on ARB medications and  is encouraged to initiate and continue to follow up with Ophthalmology, Dentist,  Podiatrist at least yearly or according to recommendations, and advised to  quit smoking. I have recommended yearly flu vaccine and pneumonia vaccine at least every 5 years; moderate intensity exercise for up to 150 minutes weekly; and  sleep for at least 7 hours a day.  - he is  advised to maintain close follow up with Waldon Reining, MD for primary care needs, as well as his other providers for optimal and coordinated care.   I spent  26  minutes in the care of the patient today including review of labs from CMP, Lipids, Thyroid Function, Hematology (current and previous including abstractions from other facilities); face-to-face time discussing  his blood glucose readings/logs, discussing hypoglycemia and hyperglycemia episodes and symptoms, medications doses, his options of short and long term treatment based on the latest standards of care / guidelines;  discussion about incorporating lifestyle medicine;  and documenting the encounter. Risk reduction counseling performed per USPSTF guidelines to reduce  obesity and cardiovascular risk factors.     Please refer to Patient Instructions for Blood Glucose Monitoring and Insulin/Medications Dosing Guide"  in media tab for additional information. Please  also  refer to " Patient Self Inventory" in the Media  tab for reviewed elements of pertinent patient history.  Micah Noel participated in the discussions, expressed understanding, and voiced agreement with the above plans.  All questions were answered to his satisfaction. he is encouraged to contact clinic should he have any questions or concerns prior to his return visit.   Follow up plan: - Return in about 4 months (around 07/15/2023) for F/U with Pre-visit Labs, Meter/CGM/Logs, A1c here.  Marquis Lunch, MD [facility] [address] [place], [place] [address] Phone: [phone]  Fax: [phone]    03/17/2023, 1:03 PM  This note was partially dictated with voice recognition software. Similar sounding words can be transcribed inadequately or may not  be corrected upon review.

## 2023-04-03 ENCOUNTER — Other Ambulatory Visit: Payer: Self-pay | Admitting: Cardiovascular Disease

## 2023-04-09 ENCOUNTER — Encounter: Payer: Self-pay | Admitting: Adult Health

## 2023-04-09 ENCOUNTER — Ambulatory Visit: Payer: Medicaid Other | Admitting: Adult Health

## 2023-04-09 VITALS — BP 123/83 | HR 95 | Temp 97.6°F | Ht 72.0 in | Wt >= 6400 oz

## 2023-04-09 DIAGNOSIS — G4733 Obstructive sleep apnea (adult) (pediatric): Secondary | ICD-10-CM | POA: Diagnosis not present

## 2023-04-09 NOTE — Assessment & Plan Note (Signed)
Good control compliance on BiPAP.  Patient is encouraged to use every single day.  Order for mask fitting sent to DME company.  Plan  Patient Instructions  Wear BIPAP all night long .  Set up mask fitting.  Work on healthy weight loss.  Follow up in 1 year and As needed

## 2023-04-09 NOTE — Assessment & Plan Note (Signed)
Weight is down 30 pounds.  Patient says he been working on this. encouraged on healthy weight loss

## 2023-04-09 NOTE — Patient Instructions (Addendum)
Wear BIPAP all night long .  Set up mask fitting.  Work on healthy weight loss.  Follow up in 1 year with Dr. Wynona Neat or Demesha Boorman NP

## 2023-04-09 NOTE — Progress Notes (Signed)
 @Patient  ID: Melvin Ray, male    DOB: Jun 02, 1985, 38 y.o.   MRN: [id number]  Chief Complaint  Patient presents with   Follow-up    Referring provider: Waldon Reining, MD  HPI: 38 year old male seen for sleep consult March 17, 2022 to establish for sleep apnea  TEST/EVENTS :  03/2020 NPSG AHI 36.4/hr , supine AHI 76/hr , spo2 79%   Echo November 2021 showed EF at 60 to 65%, mild LVH, right ventricular systolic function normal.  RV size is normal.  04/09/2023 Follow up ; OSA  Patient presents for a 1 year follow-up.  Patient has severe obstructive sleep apnea.  Is on nocturnal BiPAP.  Patient says he is doing well on BiPAP.   Download shows good compliance over the last 3 weeks.  Average daily usage at 7 hours.  Patient is on auto BiPAP IPAP max 22 EPAP minimum 18 with pressure support of 4 cm H2O.  AHI 5.0/hour.  Significant mask leaks.  Patient says he needs a new mask size.  Currently using a fullface mask.  Patient is accompanied by Child psychotherapist. Feels that he is benefiting from BiPAP with decreased daytime sleepiness.  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     Accu-Chek Softclix Lancets lancets 3 (three) times daily.     albuterol (VENTOLIN HFA) 108 (90 Base) MCG/ACT inhaler  Inhale 1-2 puffs into the lungs every 6 (six) hours as needed for wheezing or shortness of breath.     amLODipine (NORVASC) 2.5 MG tablet Take 1 tablet (2.5 mg total) by mouth daily. (Patient taking differently: Take 5 mg by mouth daily.) 90 tablet 3   chlorthalidone (HYGROTON) 25 MG tablet TAKE 1 TABLET ONCE DAILY. 90 tablet 1   Cholecalciferol (VITAMIN D3) 50 MCG (2000 UT) TABS Take 2,000 Units by mouth daily.     cloNIDine (CATAPRES) 0.3 MG tablet Take 0.3 mg by mouth 3 (three) times daily.     diltiazem (CARDIZEM CD) 300 MG 24 hr capsule Take 300 mg by mouth daily.     FEROSUL 325 (65 Fe) MG tablet Take 325 mg by mouth daily.     FLUoxetine (PROZAC) 20 MG capsule Take 20 mg by mouth daily.     Fluticasone-Salmeterol (ADVAIR) 250-50 MCG/DOSE AEPB Inhale 1 puff into the lungs daily.     hydrALAZINE (APRESOLINE) 100 MG tablet Take 100 mg by mouth 3 (three) times daily.     losartan (COZAAR) 100 MG tablet Take 100 mg by mouth at bedtime.     metFORMIN (GLUCOPHAGE-XR) 500 MG 24 hr tablet Take 1,000 mg by mouth 2 (two) times daily with breakfast and lunch.     metoprolol (TOPROL-XL) 200 MG 24 hr tablet Take 200 mg by mouth daily.  NON FORMULARY Pt uses a cpap nightly     pantoprazole (PROTONIX) 40 MG tablet Take 40 mg by mouth daily.     potassium chloride (KLOR-CON) 10 MEQ tablet Take 10 mEq by mouth daily.     Semaglutide, 2 MG/DOSE, (OZEMPIC, 2 MG/DOSE,) 8 MG/3ML SOPN Inject 2 mg into the skin once a week. Needs apt with MD for more refills 3 mL 2   traZODone (DESYREL) 100 MG tablet Take 200 mg by mouth at bedtime.      No facility-administered medications prior to visit.     Review of Systems:   Constitutional:   No  weight loss, night sweats,  Fevers, chills, fatigue, or  lassitude.  HEENT:   No headaches,  Difficulty swallowing,  Tooth/dental problems, or  Sore throat,                No sneezing, itching, ear ache, +nasal congestion, post nasal drip,   CV:  No chest pain,   Orthopnea, PND, swelling in lower extremities, anasarca, dizziness, palpitations, syncope.   GI  No heartburn, indigestion, abdominal pain, nausea, vomiting, diarrhea, change in bowel habits, loss of appetite, bloody stools.   Resp: No shortness of breath with exertion or at rest.  No excess mucus, no productive cough,  No non-productive cough,  No coughing up of blood.  No change in color of mucus.  No wheezing.  No chest wall deformity  Skin: no rash or lesions.  GU: no dysuria, change in color of urine, no urgency or frequency.  No flank pain, no hematuria   MS:  No joint pain or swelling.  No decreased range of motion.  No back pain.    Physical Exam  BP 123/83 (BP Location: Left Arm, Patient Position: Sitting, Cuff Size: Large)   Pulse 95   Temp 97.6 F (36.4 C) (Oral)   Ht 6' (1.829 m)   Wt (!) 410 lb (186 kg)   SpO2 98%   BMI 55.61 kg/m   GEN: A/Ox3; pleasant , NAD, well nourished    HEENT:  [place]/AT,   NOSE-clear, THROAT-clear, no lesions, no postnasal drip or exudate noted.  Class IV MP airway  NECK:  Supple w/ fair ROM; no JVD; normal carotid impulses w/o bruits; no thyromegaly or nodules palpated; no lymphadenopathy.    RESP  Clear  P & A; w/o, wheezes/ rales/ or rhonchi. no accessory muscle use, no dullness to percussion  CARD:  RRR, no m/r/g, no peripheral edema, pulses intact, no cyanosis or clubbing.  GI:   Soft & nt; nml bowel sounds; no organomegaly or masses detected.   Musco: Warm bil, no deformities or joint swelling noted.   Neuro: alert, no focal deficits noted.    Skin: Warm, no lesions or rashes    Lab Results:  CBC   BNP No results found for: "BNP"  ProBNP No results found for: "PROBNP"  Imaging: No results found.  Administration History     None           No data to display          No results found for: "NITRICOXIDE"      Assessment & Plan:   Obstructive sleep apnea syndrome Good control compliance on BiPAP.   Patient is encouraged to use every single day.  Order for mask fitting sent to DME company.  Plan  Patient Instructions  Wear BIPAP all night long .  Set up mask fitting.  Work on healthy weight loss.  Follow up in 1 year and As needed      Morbid obesity (HCC) Weight is down 30 pounds.  Patient says he been working on this. encouraged on healthy weight loss     Rubye Oaks, NP 04/09/2023

## 2023-05-11 ENCOUNTER — Telehealth: Payer: Self-pay

## 2023-05-11 ENCOUNTER — Ambulatory Visit: Payer: Self-pay | Attending: Cardiology

## 2023-05-11 DIAGNOSIS — Z0000 Encounter for general adult medical examination without abnormal findings: Secondary | ICD-10-CM

## 2023-05-11 NOTE — Telephone Encounter (Signed)
 Called Ether Griffins (Child psychotherapist) to determine if he was in route to bring patient to appointment. Left voicemail for him to return my call.   Renaee Munda, MS, ERHD, [facility]  Care Guide, Health & Wellness Coach [address] [place] [address] Telephone: [phone] Email: [email or web address]

## 2023-05-11 NOTE — Telephone Encounter (Signed)
 Called patient to determine if he was in-route to appointment or if he forgot today's health coaching appointment. Patient did not answer. Voicemail not setup and could not leave a message.

## 2023-05-29 LAB — IRON,TIBC AND FERRITIN PANEL
Ferritin: 210 ng/mL (ref 30–400)
Iron Saturation: 17 % (ref 15–55)
Iron: 42 ug/dL (ref 38–169)
Total Iron Binding Capacity: 245 ug/dL — ABNORMAL LOW (ref 250–450)
UIBC: 203 ug/dL (ref 111–343)

## 2023-06-02 ENCOUNTER — Encounter (INDEPENDENT_AMBULATORY_CARE_PROVIDER_SITE_OTHER): Payer: Self-pay

## 2023-06-04 ENCOUNTER — Encounter (INDEPENDENT_AMBULATORY_CARE_PROVIDER_SITE_OTHER): Payer: Self-pay | Admitting: Gastroenterology

## 2023-06-04 ENCOUNTER — Ambulatory Visit (INDEPENDENT_AMBULATORY_CARE_PROVIDER_SITE_OTHER): Payer: Medicaid Other | Admitting: Gastroenterology

## 2023-06-04 VITALS — BP 105/76 | HR 79 | Temp 98.0°F | Ht 72.0 in | Wt >= 6400 oz

## 2023-06-04 DIAGNOSIS — D509 Iron deficiency anemia, unspecified: Secondary | ICD-10-CM | POA: Diagnosis not present

## 2023-06-04 DIAGNOSIS — Z8719 Personal history of other diseases of the digestive system: Secondary | ICD-10-CM | POA: Diagnosis not present

## 2023-06-04 NOTE — Progress Notes (Signed)
 Referring Provider: Waldon Reining, MD Primary Care Physician:  Waldon Reining, MD Primary GI Physician: Dr. Levon Hedger   Chief Complaint  Patient presents with   Follow-up    Patient here today for a follow up on IDA. Patient denies any dizziness,chest pain and fatigue. Patient would like his lab report sent to Ethelene Browns, Np at [facility] clinic. Patient says he was contacted by this office to set up EDG.   HPI:   Melvin Ray is a 38 y.o. male with past medical history of DM, HTN, morbid obesity, Sleep apnea, CKD stage 3a.   Patient presenting today for:  Follow up of IDA  Last seen October 2024, at that time last hgb was 12.1, taking iron pill daily. No rectal bleeding or melena. Decreased appetite on ozempic.   Recommended to continue PPI, obtain labs from PCP, capsule study with endoscopic deployment if IDA persists, plan for repeat EGD after labs reviewed, continue daily iron  Labs on 05/28/23 with iron 42, ferritin 210, sat 17, TIBC 245, hgb 12.8 in January  Patient is scheduled for EGD on 4/22 for history of gastric polyps   Present: Patient arrives with Child psychotherapist. He is doing well today. Has no GI complaints. Denies rectal bleeding, melena, sob, dizziness, fatigue. Taking iron pill once daily. Remains on ozempic and feels he is tolerating this well. Discussed recent iron studies which were WNL with the patient   Capsule study: 01/2022 Delayed gastric emptying No large gross abnormalities were found in the small bowel lining. However, this was limited by fair preparation in the small bowel.   We will monitor CBC and iron stores in clinic for now.  If worsening anemia or iron stores, will repeat capsule endoscopy with endoscopic deployment - he will also need to be on liquid diet for 3 days Last Colonoscopy:12/2021- Diverticulosis in the sigmoid colon.                           - Non-bleeding internal hemorrhoids.                           - No specimens  collected.   Last Endoscopy: 12/2021 - Z-line regular, 42 cm from the incisors.                           - Gastroesophageal flap valve classified as Hill                            Grade III (minimal fold, loose to endoscope, hiatal                            hernia likely).                           - Two gastric polyps. Biopsied.                           - Normal examined duodenum. Biopsied.   A. SMALL BOWEL, BIOPSY:  - Duodenal mucosa with normal villous architecture.  - No villous atrophy or increased intraepithelial lymphocytes.   B. STOMACH, POLYPECTOMY:  Hyperplastic gastric polyp.    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   SVT (supraventricular tachycardia) (HCC)    Tobacco abuse     Past Surgical History:  Procedure Laterality Date   APPENDECTOMY     BIOPSY  01/07/2022   Procedure: BIOPSY;  Surgeon: Dolores Frame, MD;  [facility]: [facility];  Service: Gastroenterology;;   COLONOSCOPY WITH PROPOFOL N/A 01/07/2022   Procedure: COLONOSCOPY WITH PROPOFOL;  Surgeon: Dolores Frame, MD;  Location: [facility];  Service: Gastroenterology;  Laterality: N/A;  1030 ASA 3   ESOPHAGOGASTRODUODENOSCOPY  01/07/2022   Procedure: ESOPHAGOGASTRODUODENOSCOPY (EGD);  Surgeon: Marguerita Merles, Reuel Boom, MD;  Location: [facility];  Service: Gastroenterology;;   GIVENS CAPSULE STUDY N/A 02/05/2022   Procedure: GIVENS CAPSULE STUDY;  Surgeon: Dolores Frame, MD;  [facility]: [facility];  Service: Gastroenterology;  Laterality: N/A;  8:30am    Current Outpatient Medications  Medication Sig Dispense Refill    ACCU-CHEK GUIDE test strip 3 (three) times daily.     Accu-Chek Softclix Lancets lancets 3 (three) times daily.     albuterol (VENTOLIN HFA) 108 (90 Base) MCG/ACT inhaler Inhale 1-2 puffs into the lungs every 6 (six) hours as needed for wheezing or shortness of breath.     amLODipine (NORVASC) 2.5 MG tablet Take 1 tablet (2.5 mg total) by mouth daily. (Patient taking differently: Take 5 mg by mouth daily.) 90 tablet 3   chlorthalidone (HYGROTON) 25 MG tablet TAKE 1 TABLET ONCE DAILY. 90 tablet 1   Cholecalciferol (VITAMIN D3) 50 MCG (2000 UT) TABS Take 2,000 Units by mouth daily.     cloNIDine (CATAPRES) 0.3 MG tablet Take 0.3 mg by mouth 2 (two) times daily.     diltiazem (CARDIZEM CD) 300 MG 24 hr capsule Take 300 mg by mouth daily.     FEROSUL 325 (65 Fe) MG tablet Take 325 mg by mouth daily.     FLUoxetine (PROZAC) 20 MG capsule Take 20 mg by mouth daily.     Fluticasone-Salmeterol (ADVAIR) 250-50 MCG/DOSE AEPB Inhale 1 puff into the lungs daily.     hydrALAZINE (APRESOLINE) 100 MG tablet Take 100 mg by mouth 2 (two) times daily.     losartan (COZAAR) 100 MG tablet Take 100 mg by mouth at bedtime.     metFORMIN (GLUCOPHAGE-XR) 500 MG 24 hr tablet Take 1,000 mg by mouth 2 (two) times daily with breakfast and lunch.     metoprolol (TOPROL-XL) 200 MG 24 hr tablet Take 200 mg by mouth daily.     NON FORMULARY Pt uses a cpap nightly     pantoprazole (PROTONIX) 40 MG tablet Take 40 mg by mouth daily.     potassium chloride (KLOR-CON) 10 MEQ tablet Take 10 mEq by mouth daily.     Semaglutide, 2 MG/DOSE, (OZEMPIC, 2 MG/DOSE,) 8 MG/3ML SOPN Inject 2 mg into the skin once a week. Needs apt with MD for more refills 3 mL 2   traZODone (DESYREL) 100 MG tablet Take 200 mg by mouth at bedtime.      No current facility-administered medications for this visit.    Allergies as of 06/04/2023   (No Known Allergies)  Social History   Socioeconomic History   Marital status: Single    Spouse name:  Not on file   Number of children: Not on file   Years of education: Not on file   Highest education level: Not on file  Occupational History   Not on file  Tobacco Use   Smoking status: Former    Types: Cigars    Passive exposure: Current   Smokeless tobacco: Former  Building services engineer status: Every Day  Substance and Sexual Activity   Alcohol use: Never   Drug use: Never   Sexual activity: Not on file  Other Topics Concern   Not on file  Social History Narrative   Not on file   Social Drivers of Health   Financial Resource Strain: Medium Risk (03/24/2022)   Overall Financial Resource Strain (CARDIA)    Difficulty of Paying Living Expenses: Somewhat hard  Food Insecurity: No Food Insecurity (03/24/2022)   Hunger Vital Sign    Worried About Running Out of Food in the Last Year: Never true    Ran Out of Food in the Last Year: Never true  Transportation Needs: No Transportation Needs (03/24/2022)   PRAPARE - Administrator, Civil Service (Medical): No    Lack of Transportation (Non-Medical): No  Physical Activity: Insufficiently Active (07/31/2020)   Exercise Vital Sign    Days of Exercise per Week: 3 days    Minutes of Exercise per Session: 40 min  Stress: Not on file  Social Connections: Not on file    Review of systems General: negative for malaise, night sweats, fever, chills, weight loss Neck: Negative for lumps, goiter, pain and significant neck swelling Resp: Negative for cough, wheezing, dyspnea at rest CV: Negative for chest pain, leg swelling, palpitations, orthopnea GI: denies melena, hematochezia, nausea, vomiting, diarrhea, constipation, dysphagia, odyonophagia, early satiety or unintentional weight loss.  The remainder of the review of systems is noncontributory.  Physical Exam: BP 105/76 (BP Location: Left Arm, Patient Position: Sitting, Cuff Size: Large)   Pulse 79   Temp 98 F (36.7 C) (Temporal)   Ht 6' (1.829 m)   Wt (!) 403 lb 9.6 oz  (183.1 kg)   BMI 54.74 kg/m  General:   Alert and oriented. No distress noted. Pleasant and cooperative.  Head:  Normocephalic and atraumatic. Eyes:  Conjuctiva clear without scleral icterus. Mouth:  Oral mucosa pink and moist. Good dentition. No lesions. Heart: Normal rate and rhythm, s1 and s2 heart sounds present.  Lungs: Clear lung sounds in all lobes. Respirations equal and unlabored. Abdomen:  +BS, soft, non-tender and non-distended. No rebound or guarding. No HSM or masses noted. Derm: No palmar erythema or jaundice Msk:  Symmetrical without gross deformities. Normal posture. Extremities:  Without edema. Neurologic:  Alert and  oriented x4 Psych:  Alert and cooperative. Flat affect   Invalid input(s): "6 MONTHS"   ASSESSMENT: Melvin Ray is a 37 y.o. male presenting today for follow up of IDA   IDA since atleast least 2020, no rectal bleeding or melena. Maintained on Iron PO once daily. Evaluation has included EGD, Colonoscopy and givens capsule study without findings to explain his IDA. Most recent iron studies and hgb as above WNL. He has no GI complaints. He is scheduled for repeat EGD on 4/22 for history of hyperplastic gastric polyps. Will continue with PO iron daily and PPI daily for now.     PLAN:  -continue PO iron daily -continue PPI daily  -  keep scheduled EGD on 4/22  All questions were answered, patient verbalized understanding and is in agreement with plan as outlined above.   Follow Up: TBD after EGD  Terri Malerba L. Jeanmarie Hubert, MSN, APRN, AGNP-C Adult-Gerontology Nurse Practitioner [facility]  I have reviewed the note and agree with the APP's assessment as described in this progress note  Katrinka Blazing, MD Gastroenterology and Hepatology [facility]

## 2023-06-04 NOTE — H&P (View-Only) (Signed)
 Referring Provider: Elam Gray, MD Primary Care Physician:  Elam Gray, MD Primary GI Physician: Dr. Sammi Crick   Chief Complaint  Patient presents with   Follow-up    Patient here today for a follow up on IDA. Patient denies any dizziness,chest pain and fatigue. Patient would like his lab report sent to Leary Provencal, Np at [facility] clinic. Patient says he was contacted by this office to set up EDG.   HPI:   Shaurya Rawdon is a 38 y.o. male with past medical history of DM, HTN, morbid obesity, Sleep apnea, CKD stage 3a.   Patient presenting today for:  Follow up of IDA  Last seen October 2024, at that time last hgb was 12.1, taking iron pill daily. No rectal bleeding or melena. Decreased appetite on ozempic .   Recommended to continue PPI, obtain labs from PCP, capsule study with endoscopic deployment if IDA persists, plan for repeat EGD after labs reviewed, continue daily iron  Labs on 05/28/23 with iron 42, ferritin 210, sat 17, TIBC 245, hgb 12.8 in January  Patient is scheduled for EGD on 4/22 for history of gastric polyps   Present: Patient arrives with Child psychotherapist. He is doing well today. Has no GI complaints. Denies rectal bleeding, melena, sob, dizziness, fatigue. Taking iron pill once daily. Remains on ozempic  and feels he is tolerating this well. Discussed recent iron studies which were WNL with the patient   Capsule study: 01/2022 Delayed gastric emptying No large gross abnormalities were found in the small bowel lining. However, this was limited by fair preparation in the small bowel.   We will monitor CBC and iron stores in clinic for now.  If worsening anemia or iron stores, will repeat capsule endoscopy with endoscopic deployment - he will also need to be on liquid diet for 3 days Last Colonoscopy:12/2021- Diverticulosis in the sigmoid colon.                           - Non-bleeding internal hemorrhoids.                           - No specimens  collected.   Last Endoscopy: 12/2021 - Z-line regular, 42 cm from the incisors.                           - Gastroesophageal flap valve classified as Hill                            Grade III (minimal fold, loose to endoscope, hiatal                            hernia likely).                           - Two gastric polyps. Biopsied.                           - Normal examined duodenum. Biopsied.   A. SMALL BOWEL, BIOPSY:  - Duodenal mucosa with normal villous architecture.  - No villous atrophy or increased intraepithelial lymphocytes.   B. STOMACH, POLYPECTOMY:  Hyperplastic gastric polyp.    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   SVT (supraventricular tachycardia) (HCC)    Tobacco abuse     Past Surgical History:  Procedure Laterality Date   APPENDECTOMY     BIOPSY  01/07/2022   Procedure: BIOPSY;  Surgeon: Urban Garden, MD;  Location: [facility];  Service: Gastroenterology;;   COLONOSCOPY WITH PROPOFOL  N/A 01/07/2022   Procedure: COLONOSCOPY WITH PROPOFOL ;  Surgeon: Urban Garden, MD;  Location: [facility];  Service: Gastroenterology;  Laterality: N/A;  1030 ASA 3   ESOPHAGOGASTRODUODENOSCOPY  01/07/2022   Procedure: ESOPHAGOGASTRODUODENOSCOPY (EGD);  Surgeon: Umberto Ganong, Bearl Limes, MD;  Location: [facility];  Service: Gastroenterology;;   GIVENS CAPSULE STUDY N/A 02/05/2022   Procedure: GIVENS CAPSULE STUDY;  Surgeon: Urban Garden, MD;  Location: [facility];  Service: Gastroenterology;  Laterality: N/A;  8:30am    Current Outpatient Medications  Medication Sig Dispense Refill    ACCU-CHEK GUIDE test strip 3 (three) times daily.     Accu-Chek Softclix Lancets lancets 3 (three) times daily.     albuterol (VENTOLIN HFA) 108 (90 Base) MCG/ACT inhaler Inhale 1-2 puffs into the lungs every 6 (six) hours as needed for wheezing or shortness of breath.     amLODipine  (NORVASC ) 2.5 MG tablet Take 1 tablet (2.5 mg total) by mouth daily. (Patient taking differently: Take 5 mg by mouth daily.) 90 tablet 3   chlorthalidone  (HYGROTON ) 25 MG tablet TAKE 1 TABLET ONCE DAILY. 90 tablet 1   Cholecalciferol  (VITAMIN D3) 50 MCG (2000 UT) TABS Take 2,000 Units by mouth daily.     cloNIDine (CATAPRES) 0.3 MG tablet Take 0.3 mg by mouth 2 (two) times daily.     diltiazem (CARDIZEM CD) 300 MG 24 hr capsule Take 300 mg by mouth daily.     FEROSUL 325 (65 Fe) MG tablet Take 325 mg by mouth daily.     FLUoxetine (PROZAC) 20 MG capsule Take 20 mg by mouth daily.     Fluticasone-Salmeterol (ADVAIR) 250-50 MCG/DOSE AEPB Inhale 1 puff into the lungs daily.     hydrALAZINE  (APRESOLINE ) 100 MG tablet Take 100 mg by mouth 2 (two) times daily.     losartan (COZAAR) 100 MG tablet Take 100 mg by mouth at bedtime.     metFORMIN  (GLUCOPHAGE -XR) 500 MG 24 hr tablet Take 1,000 mg by mouth 2 (two) times daily with breakfast and lunch.     metoprolol (TOPROL-XL) 200 MG 24 hr tablet Take 200 mg by mouth daily.     NON FORMULARY Pt uses a cpap nightly     pantoprazole (PROTONIX) 40 MG tablet Take 40 mg by mouth daily.     potassium chloride  (KLOR-CON ) 10 MEQ tablet Take 10 mEq by mouth daily.     Semaglutide , 2 MG/DOSE, (OZEMPIC , 2 MG/DOSE,) 8 MG/3ML SOPN Inject 2 mg into the skin once a week. Needs apt with MD for more refills 3 mL 2   traZODone (DESYREL) 100 MG tablet Take 200 mg by mouth at bedtime.      No current facility-administered medications for this visit.    Allergies as of 06/04/2023   (No Known Allergies)  Social History   Socioeconomic History   Marital status: Single    Spouse name:  Not on file   Number of children: Not on file   Years of education: Not on file   Highest education level: Not on file  Occupational History   Not on file  Tobacco Use   Smoking status: Former    Types: Cigars    Passive exposure: Current   Smokeless tobacco: Former  Building services engineer status: Every Day  Substance and Sexual Activity   Alcohol use: Never   Drug use: Never   Sexual activity: Not on file  Other Topics Concern   Not on file  Social History Narrative   Not on file   Social Drivers of Health   Financial Resource Strain: Medium Risk (03/24/2022)   Overall Financial Resource Strain (CARDIA)    Difficulty of Paying Living Expenses: Somewhat hard  Food Insecurity: No Food Insecurity (03/24/2022)   Hunger Vital Sign    Worried About Running Out of Food in the Last Year: Never true    Ran Out of Food in the Last Year: Never true  Transportation Needs: No Transportation Needs (03/24/2022)   PRAPARE - Administrator, Civil Service (Medical): No    Lack of Transportation (Non-Medical): No  Physical Activity: Insufficiently Active (07/31/2020)   Exercise Vital Sign    Days of Exercise per Week: 3 days    Minutes of Exercise per Session: 40 min  Stress: Not on file  Social Connections: Not on file    Review of systems General: negative for malaise, night sweats, fever, chills, weight loss Neck: Negative for lumps, goiter, pain and significant neck swelling Resp: Negative for cough, wheezing, dyspnea at rest CV: Negative for chest pain, leg swelling, palpitations, orthopnea GI: denies melena, hematochezia, nausea, vomiting, diarrhea, constipation, dysphagia, odyonophagia, early satiety or unintentional weight loss.  The remainder of the review of systems is noncontributory.  Physical Exam: BP 105/76 (BP Location: Left Arm, Patient Position: Sitting, Cuff Size: Large)   Pulse 79   Temp 98 F (36.7 C) (Temporal)   Ht 6' (1.829 m)   Wt (!) 403 lb 9.6 oz  (183.1 kg)   BMI 54.74 kg/m  General:   Alert and oriented. No distress noted. Pleasant and cooperative.  Head:  Normocephalic and atraumatic. Eyes:  Conjuctiva clear without scleral icterus. Mouth:  Oral mucosa pink and moist. Good dentition. No lesions. Heart: Normal rate and rhythm, s1 and s2 heart sounds present.  Lungs: Clear lung sounds in all lobes. Respirations equal and unlabored. Abdomen:  +BS, soft, non-tender and non-distended. No rebound or guarding. No HSM or masses noted. Derm: No palmar erythema or jaundice Msk:  Symmetrical without gross deformities. Normal posture. Extremities:  Without edema. Neurologic:  Alert and  oriented x4 Psych:  Alert and cooperative. Flat affect   Invalid input(s): "6 MONTHS"   ASSESSMENT: Zack Crager is a 38 y.o. male presenting today for follow up of IDA   IDA since atleast least 2020, no rectal bleeding or melena. Maintained on Iron PO once daily. Evaluation has included EGD, Colonoscopy and givens capsule study without findings to explain his IDA. Most recent iron studies and hgb as above WNL. He has no GI complaints. He is scheduled for repeat EGD on 4/22 for history of hyperplastic gastric polyps. Will continue with PO iron daily and PPI daily for now.     PLAN:  -continue PO iron daily -continue PPI daily  -  keep scheduled EGD on 4/22  All questions were answered, patient verbalized understanding and is in agreement with plan as outlined above.   Follow Up: TBD after EGD  Savon Cobbs L. Adrien Alberta, MSN, APRN, AGNP-C Adult-Gerontology Nurse Practitioner [facility]  I have reviewed the note and agree with the APP's assessment as described in this progress note  Samantha Cress, MD Gastroenterology and Hepatology [facility]astroenterology

## 2023-06-04 NOTE — Patient Instructions (Signed)
 Please continue to take iron once daily We will see you on 4/22 for upper endoscopy  Follow up to be determined after procedure

## 2023-06-09 NOTE — Patient Instructions (Signed)
 Melvin Ray  06/09/2023     @PREFPERIOPPHARMACY @   Your procedure is scheduled on 06/16/2023.   [facility] to [facility] at 12:30 PM   Call this number if you have problems the morning of surgery:   [phone]  If you experience any cold or flu symptoms such as cough, fever, chills, shortness of breath, etc. between now and your scheduled surgery, please notify us at the above number.   Remember:   Please Follow the diet instructions given to you by Dr Wilburt Finlay office.    You may drink clear liquids until 10:30 AM .  Clear liquids allowed are:                    Water, Juice (No red color; non-citric and without pulp; diabetics please choose diet or no sugar options), Carbonated beverages (diabetics please choose diet or no sugar options), Clear Tea (No creamer, milk, or cream, including half & half and powdered creamer), Black Coffee Only (No creamer, milk or cream, including half & half and powdered creamer), Plain Jell-O Only (No red color; diabetics please choose no sugar options), Clear Sports drink (No red color; diabetics please choose diet or no sugar options), and Plain Popsicles Only (No red color; diabetics please choose no sugar options)    Take these medicines the morning of surgery with A SIP OF WATER : Amlodipine Catapres Diltiazem Prozac Apresoline Metoprolol Protonix   Last dose of Ozempic should be on 06/08/2023   Please use your Albuterol Inhaler before you come to the hospital and bring your rescue inhaler with you.     Do not wear jewelry, make-up or nail polish, including gel polish,  artificial nails, or any other type of covering on natural nails (fingers and  toes).  Do not wear lotions, powders, or perfumes, or deodorant.  Do not shave 48 hours prior to surgery.  Men may shave face and neck.  Do not bring valuables to the hospital.  [facility] is not responsible for any belongings or valuables.  Contacts, dentures or bridgework may not be worn into  surgery.  Leave your suitcase in the car.  After surgery it may be brought to your room.  For patients admitted to the hospital, discharge time will be determined by your treatment team.  Patients discharged the day of surgery will not be allowed to drive home.   Name and phone number of your driver:   Family  Special instructions:  N/A  Please read over the following fact sheets that you were given.  Care and Recovery After Surgery  Upper Endoscopy, Adult Upper endoscopy is a procedure to look inside the upper GI (gastrointestinal) tract. The upper GI tract is made up of: The esophagus. This is the part of the body that moves food from your mouth to your stomach. The stomach. The duodenum. This is the first part of your small intestine. This procedure is also called esophagogastroduodenoscopy (EGD) or gastroscopy. In this procedure, your health care provider passes a thin, flexible tube (endoscope) through your mouth and down your esophagus into your stomach and into your duodenum. A small camera is attached to the end of the tube. Images from the camera appear on a monitor in the exam room. During this procedure, your health care provider may also remove a small piece of tissue to be sent to a lab and examined under a microscope (biopsy). Your health care provider may do an upper endoscopy to diagnose cancers  of the upper GI tract. You may also have this procedure to find the cause of other conditions, such as: Stomach pain. Heartburn. Pain or problems when swallowing. Nausea and vomiting. Stomach bleeding. Stomach ulcers. Tell a health care provider about: Any allergies you have. All medicines you are taking, including vitamins, herbs, eye drops, creams, and over-the-counter medicines. Any problems you or family members have had with anesthetic medicines. Any bleeding problems you have. Any surgeries you have had. Any medical conditions you have. Whether you are pregnant or may  be pregnant. What are the risks? Your healthcare provider will talk with you about risks. These may include: Infection. Bleeding. Allergic reactions to medicines. A tear or hole (perforation) in the esophagus, stomach, or duodenum. What happens before the procedure? When to stop eating and drinking Follow instructions from your health care provider about what you may eat and drink. These may include: 8 hours before your procedure Stop eating most foods. Do not eat meat, fried foods, or fatty foods. Eat only light foods, such as toast or crackers. All liquids are okay except energy drinks and alcohol. 6 hours before your procedure Stop eating. Drink only clear liquids, such as water, clear fruit juice, black coffee, plain tea, and sports drinks. Do not drink energy drinks or alcohol. 2 hours before your procedure Stop drinking all liquids. You may be allowed to take medicines with small sips of water. If you do not follow your health care provider's instructions, your procedure may be delayed or canceled. Medicines Ask your health care provider about: Changing or stopping your regular medicines. This is especially important if you are taking diabetes medicines or blood thinners. Taking medicines such as aspirin and ibuprofen. These medicines can thin your blood. Do not take these medicines unless your health care provider tells you to take them. Taking over-the-counter medicines, vitamins, herbs, and supplements. General instructions If you will be going home right after the procedure, plan to have a responsible adult: Take you home from the hospital or clinic. You will not be allowed to drive. Care for you for the time you are told. What happens during the procedure?  An IV will be inserted into one of your veins. You may be given one or more of the following: A medicine to help you relax (sedative). A medicine to numb the throat (local anesthetic). You will lie on your left side  on an exam table. Your health care provider will pass the endoscope through your mouth and down your esophagus. Your health care provider will use the scope to check the inside of your esophagus, stomach, and duodenum. Biopsies may be taken. The endoscope will be removed. The procedure may vary among health care providers and hospitals. What happens after the procedure? Your blood pressure, heart rate, breathing rate, and blood oxygen level will be monitored until you leave the hospital or clinic. When your throat is no longer numb, you may be given some fluids to drink. If you were given a sedative during the procedure, it can affect you for several hours. Do not drive or operate machinery until your health care provider says that it is safe. It is up to you to get the results of your procedure. Ask your health care provider, or the department that is doing the procedure, when your results will be ready. Contact a health care provider if you: Have a sore throat that lasts longer than 1 day. Have a fever. Get help right away if you: Vomit blood  or your vomit looks like coffee grounds. Have bloody, black, or tarry stools. Have a very bad sore throat or you cannot swallow. Have difficulty breathing or very bad pain in your chest or abdomen. These symptoms may be an emergency. Get help right away. Call 911. Do not wait to see if the symptoms will go away. Do not drive yourself to the hospital. Summary Upper endoscopy is a procedure to look inside the upper GI tract. During the procedure, an IV will be inserted into one of your veins. You may be given a medicine to help you relax. The endoscope will be passed through your mouth and down your esophagus. Follow instructions from your health care provider about what you can eat and drink. This information is not intended to replace advice given to you by your health care provider. Make sure you discuss any questions you have with your health care  provider. Document Revised: 05/22/2021 Document Reviewed: 05/22/2021 Elsevier Patient Education  2024 [employer]  Monitored Anesthesia Care Anesthesia refers to the techniques, procedures, and medicines that help a person stay safe and comfortable during surgery. Monitored anesthesia care, or sedation, is one type of anesthesia. You may have sedation if you do not need to be asleep for your procedure. Procedures that use sedation may include: Surgery to remove cataracts from your eyes. A dental procedure. A biopsy. This is when a tissue sample is removed and looked at under a microscope. You will be watched closely during your procedure. Your level of sedation or type of anesthesia may be changed to fit your needs. Tell a health care provider about: Any allergies you have. All medicines you are taking, including vitamins, herbs, eye drops, creams, and over-the-counter medicines. Any problems you or family members have had with anesthesia. Any bleeding problems you have. Any surgeries you have had. Any medical conditions or illnesses you have. This includes sleep apnea, cough, fever, or the flu. Whether you are pregnant or may be pregnant. Whether you use cigarettes, alcohol, or drugs. Any use of steroids, whether by mouth or as a cream. What are the risks? Your health care provider will talk with you about risks. These may include: Getting too much medicine (oversedation). Nausea. Allergic reactions to medicines. Trouble breathing. If this happens, a breathing tube may be used to help you breathe. It will be removed when you are awake and breathing on your own. Heart trouble. Lung trouble. Confusion that gets better with time (emergence delirium). What happens before the procedure? When to stop eating and drinking Follow instructions from your health care provider about what you may eat and drink. These may include: 8 hours before your procedure Stop eating most foods. Do not eat  meat, fried foods, or fatty foods. Eat only light foods, such as toast or crackers. All liquids are okay except energy drinks and alcohol. 6 hours before your procedure Stop eating. Drink only clear liquids, such as water, clear fruit juice, black coffee, plain tea, and sports drinks. Do not drink energy drinks or alcohol. 2 hours before your procedure Stop drinking all liquids. You may be allowed to take medicines with small sips of water. If you do not follow your health care provider's instructions, your procedure may be delayed or canceled. Medicines Ask your health care provider about: Changing or stopping your regular medicines. These include any diabetes medicines or blood thinners you take. Taking medicines such as aspirin and ibuprofen. These medicines can thin your blood. Do not take them unless  your health care provider tells you to. Taking over-the-counter medicines, vitamins, herbs, and supplements. Testing You may have an exam or testing. You may have a blood or urine sample taken. General instructions Do not use any products that contain nicotine or tobacco for at least 4 weeks before the procedure. These products include cigarettes, chewing tobacco, and vaping devices, such as e-cigarettes. If you need help quitting, ask your health care provider. If you will be going home right after the procedure, plan to have a responsible adult: Take you home from the hospital or clinic. You will not be allowed to drive. Care for you for the time you are told. What happens during the procedure?  Your blood pressure, heart rate, breathing, level of pain, and blood oxygen level will be monitored. An IV will be inserted into one of your veins. You may be given: A sedative. This helps you relax. Anesthesia. This will: Numb certain areas of your body. Make you fall asleep for surgery. You will be given medicines as needed to keep you comfortable. The more medicine you are given, the  deeper your level of sedation will be. Your level of sedation may be changed to fit your needs. There are three levels of sedation: Mild sedation. At this level, you may feel awake and relaxed. You will be able to follow directions. Moderate sedation. At this level, you will be sleepy. You may not remember the procedure. Deep sedation. At this level, you will be asleep. You will not remember the procedure. How you get the medicines will depend on your age and the procedure. They may be given as: A pill. This may be taken by mouth (orally) or inserted into the rectum. An injection. This may be into a vein or muscle. A spray through the nose. After your procedure is over, the medicine will be stopped. The procedure may vary among health care providers and hospitals. What happens after the procedure? Your blood pressure, heart rate, breathing rate, and blood oxygen level will be monitored until you leave the hospital or clinic. You may feel sleepy, clumsy, or nauseous. You may not remember what happened during or after the procedure. Sedation can affect you for several hours. Do not drive or use machinery until your health care provider says that it is safe. This information is not intended to replace advice given to you by your health care provider. Make sure you discuss any questions you have with your health care provider. Document Revised: 07/08/2021 Document Reviewed: 07/08/2021 Elsevier Patient Education  2024 [employer].

## 2023-06-10 ENCOUNTER — Encounter: Payer: Self-pay | Admitting: Podiatry

## 2023-06-10 ENCOUNTER — Telehealth (INDEPENDENT_AMBULATORY_CARE_PROVIDER_SITE_OTHER): Payer: Self-pay | Admitting: Gastroenterology

## 2023-06-10 ENCOUNTER — Ambulatory Visit (INDEPENDENT_AMBULATORY_CARE_PROVIDER_SITE_OTHER): Payer: Medicaid Other | Admitting: Podiatry

## 2023-06-10 DIAGNOSIS — M79674 Pain in right toe(s): Secondary | ICD-10-CM | POA: Diagnosis not present

## 2023-06-10 DIAGNOSIS — M79675 Pain in left toe(s): Secondary | ICD-10-CM | POA: Diagnosis not present

## 2023-06-10 DIAGNOSIS — Q828 Other specified congenital malformations of skin: Secondary | ICD-10-CM | POA: Diagnosis not present

## 2023-06-10 DIAGNOSIS — B351 Tinea unguium: Secondary | ICD-10-CM

## 2023-06-10 DIAGNOSIS — E1159 Type 2 diabetes mellitus with other circulatory complications: Secondary | ICD-10-CM

## 2023-06-10 NOTE — Telephone Encounter (Signed)
 Fax from [facility] stating NO pa needed for EGD

## 2023-06-10 NOTE — Progress Notes (Signed)
 This patient returns to my office for at risk foot care.  This patient requires this care by a professional since this patient will be at risk due to having diabetes.   This patient is unable to cut nails himself since the patient cannot reach his nails.These nails are painful walking and wearing shoes.  This patient presents for at risk foot care today. His caregiver states he has developed a painful callus on the inside of left foot. He is brought to the office by caregiver from  [place].  General Appearance  Alert, conversant and in no acute stress.  Vascular  Dorsalis pedis and posterior tibial  pulses are not  palpable  Bilaterally due to swelling..  Capillary return is within normal limits  bilaterally. Temperature is within normal limits  bilaterally.  Neurologic  Senn-Weinstein monofilament wire test within normal limits  bilaterally. Muscle power within normal limits bilaterally.  Nails Thick disfigured discolored nails with subungual debris  from hallux to fifth toes bilaterally. No evidence of bacterial infection or drainage bilaterally.  Orthopedic  No limitations of motion  feet .  No crepitus or effusions noted.  No bony pathology or digital deformities noted.  Skin  normotropic skin with no porokeratosis noted bilaterally.  No signs of infections or ulcers noted.    Onychomycosis  Pain in right toes  Pain in left toes  Consent was obtained for treatment procedures.   Mechanical debridement of nails 1-5  bilaterally performed with a nail nipper.  Filed with dremel without incident. Patient request an appointment for possible medication for his toenails.   Return office visit   3 months                 Told patient to return for periodic foot care and evaluation due to potential at risk complications.   Ruffin Cotton [name]

## 2023-06-11 ENCOUNTER — Encounter (HOSPITAL_COMMUNITY): Payer: Self-pay

## 2023-06-11 ENCOUNTER — Encounter (HOSPITAL_COMMUNITY)

## 2023-06-11 ENCOUNTER — Encounter (HOSPITAL_COMMUNITY)
Admission: RE | Admit: 2023-06-11 | Discharge: 2023-06-11 | Disposition: A | Discharge status: Home / Self Care | Source: Ambulatory Visit | Attending: Gastroenterology | Admitting: Gastroenterology

## 2023-06-11 VITALS — BP 105/76 | HR 79 | Temp 98.0°F | Resp 18 | Ht 72.0 in | Wt >= 6400 oz

## 2023-06-11 DIAGNOSIS — Z01818 Encounter for other preprocedural examination: Secondary | ICD-10-CM | POA: Diagnosis present

## 2023-06-11 DIAGNOSIS — D631 Anemia in chronic kidney disease: Secondary | ICD-10-CM | POA: Diagnosis not present

## 2023-06-11 DIAGNOSIS — Z01812 Encounter for preprocedural laboratory examination: Secondary | ICD-10-CM | POA: Insufficient documentation

## 2023-06-11 DIAGNOSIS — D500 Iron deficiency anemia secondary to blood loss (chronic): Secondary | ICD-10-CM | POA: Insufficient documentation

## 2023-06-11 DIAGNOSIS — E1122 Type 2 diabetes mellitus with diabetic chronic kidney disease: Secondary | ICD-10-CM | POA: Insufficient documentation

## 2023-06-11 DIAGNOSIS — N182 Chronic kidney disease, stage 2 (mild): Secondary | ICD-10-CM | POA: Insufficient documentation

## 2023-06-11 DIAGNOSIS — Z794 Long term (current) use of insulin: Secondary | ICD-10-CM | POA: Diagnosis not present

## 2023-06-11 LAB — CBC WITH DIFFERENTIAL/PLATELET
Abs Immature Granulocytes: 0.04 10*3/uL (ref 0.00–0.07)
Basophils Absolute: 0 10*3/uL (ref 0.0–0.1)
Basophils Relative: 0 %
Eosinophils Absolute: 0.2 10*3/uL (ref 0.0–0.5)
Eosinophils Relative: 2 %
HCT: 40.5 % (ref 39.0–52.0)
Hemoglobin: 12.6 g/dL — ABNORMAL LOW (ref 13.0–17.0)
Immature Granulocytes: 1 %
Lymphocytes Relative: 33 %
Lymphs Abs: 2.9 10*3/uL (ref 0.7–4.0)
MCH: 27 pg (ref 26.0–34.0)
MCHC: 31.1 g/dL (ref 30.0–36.0)
MCV: 86.9 fL (ref 80.0–100.0)
Monocytes Absolute: 0.8 10*3/uL (ref 0.1–1.0)
Monocytes Relative: 9 %
Neutro Abs: 4.8 10*3/uL (ref 1.7–7.7)
Neutrophils Relative %: 55 %
Platelets: 240 10*3/uL (ref 150–400)
RBC: 4.66 MIL/uL (ref 4.22–5.81)
RDW: 14.9 % (ref 11.5–15.5)
WBC: 8.7 10*3/uL (ref 4.0–10.5)
nRBC: 0 % (ref 0.0–0.2)

## 2023-06-11 LAB — BASIC METABOLIC PANEL WITH GFR
Anion gap: 15 (ref 5–15)
BUN: 16 mg/dL (ref 6–20)
CO2: 27 mmol/L (ref 22–32)
Calcium: 9.6 mg/dL (ref 8.9–10.3)
Chloride: 95 mmol/L — ABNORMAL LOW (ref 98–111)
Creatinine, Ser: 1.13 mg/dL (ref 0.61–1.24)
GFR, Estimated: >60 mL/min (ref >60)
Glucose, Bld: 108 mg/dL — ABNORMAL HIGH (ref 70–99)
Potassium: 3.2 mmol/L — ABNORMAL LOW (ref 3.5–5.1)
Sodium: 137 mmol/L (ref 135–145)

## 2023-06-16 ENCOUNTER — Other Ambulatory Visit: Payer: Self-pay

## 2023-06-16 ENCOUNTER — Encounter (HOSPITAL_COMMUNITY)
Admission: RE | Disposition: A | Discharge status: Home / Self Care | Payer: Self-pay | Source: Home / Self Care | Attending: Gastroenterology

## 2023-06-16 ENCOUNTER — Ambulatory Visit (HOSPITAL_COMMUNITY)
Admission: RE | Admit: 2023-06-16 | Discharge: 2023-06-16 | Disposition: A | Discharge status: Home / Self Care | Attending: Gastroenterology | Admitting: Gastroenterology

## 2023-06-16 ENCOUNTER — Ambulatory Visit (HOSPITAL_COMMUNITY)

## 2023-06-16 ENCOUNTER — Encounter (HOSPITAL_COMMUNITY): Payer: Self-pay | Admitting: Gastroenterology

## 2023-06-16 DIAGNOSIS — E6689 Other obesity not elsewhere classified: Secondary | ICD-10-CM | POA: Insufficient documentation

## 2023-06-16 DIAGNOSIS — I110 Hypertensive heart disease with heart failure: Secondary | ICD-10-CM

## 2023-06-16 DIAGNOSIS — N1831 Chronic kidney disease, stage 3a: Secondary | ICD-10-CM | POA: Diagnosis not present

## 2023-06-16 DIAGNOSIS — Z6841 Body Mass Index (BMI) 40.0 and over, adult: Secondary | ICD-10-CM | POA: Insufficient documentation

## 2023-06-16 DIAGNOSIS — Z87891 Personal history of nicotine dependence: Secondary | ICD-10-CM | POA: Insufficient documentation

## 2023-06-16 DIAGNOSIS — Z7985 Long-term (current) use of injectable non-insulin antidiabetic drugs: Secondary | ICD-10-CM | POA: Insufficient documentation

## 2023-06-16 DIAGNOSIS — F32A Depression, unspecified: Secondary | ICD-10-CM | POA: Insufficient documentation

## 2023-06-16 DIAGNOSIS — T182XXA Foreign body in stomach, initial encounter: Secondary | ICD-10-CM | POA: Diagnosis not present

## 2023-06-16 DIAGNOSIS — Z7984 Long term (current) use of oral hypoglycemic drugs: Secondary | ICD-10-CM | POA: Insufficient documentation

## 2023-06-16 DIAGNOSIS — E1122 Type 2 diabetes mellitus with diabetic chronic kidney disease: Secondary | ICD-10-CM | POA: Insufficient documentation

## 2023-06-16 DIAGNOSIS — I509 Heart failure, unspecified: Secondary | ICD-10-CM | POA: Insufficient documentation

## 2023-06-16 DIAGNOSIS — K317 Polyp of stomach and duodenum: Secondary | ICD-10-CM | POA: Insufficient documentation

## 2023-06-16 DIAGNOSIS — I130 Hypertensive heart and chronic kidney disease with heart failure and stage 1 through stage 4 chronic kidney disease, or unspecified chronic kidney disease: Secondary | ICD-10-CM | POA: Diagnosis not present

## 2023-06-16 DIAGNOSIS — Z8719 Personal history of other diseases of the digestive system: Secondary | ICD-10-CM

## 2023-06-16 HISTORY — PX: ESOPHAGOGASTRODUODENOSCOPY: SHX5428

## 2023-06-16 LAB — GLUCOSE, CAPILLARY: Glucose-Capillary: 158 mg/dL — ABNORMAL HIGH (ref 70–99)

## 2023-06-16 SURGERY — EGD (ESOPHAGOGASTRODUODENOSCOPY)
Anesthesia: General

## 2023-06-16 MED ORDER — LACTATED RINGERS IV SOLN: INTRAVENOUS | Status: DC | PRN

## 2023-06-16 MED ORDER — KETAMINE HCL 50 MG/5ML IJ SOSY
PREFILLED_SYRINGE | INTRAMUSCULAR | Status: AC
Start: 2023-06-16 — End: ?
  Filled 2023-06-16: qty 5

## 2023-06-16 MED ORDER — PROPOFOL 500 MG/50ML IV EMUL
INTRAVENOUS | Status: DC | PRN
  Administered 2023-06-16: 150 ug/kg/min via INTRAVENOUS

## 2023-06-16 MED ORDER — KETAMINE HCL 50 MG/5ML IJ SOSY
PREFILLED_SYRINGE | INTRAMUSCULAR | Status: DC | PRN
  Administered 2023-06-16: 20 mg via INTRAVENOUS

## 2023-06-16 MED ORDER — PROPOFOL 10 MG/ML IV BOLUS
INTRAVENOUS | Status: DC | PRN
  Administered 2023-06-16 (×2): 50 mg via INTRAVENOUS

## 2023-06-16 MED ORDER — LIDOCAINE 2% (20 MG/ML) 5 ML SYRINGE
INTRAMUSCULAR | Status: DC | PRN
  Administered 2023-06-16: 100 mg via INTRAVENOUS

## 2023-06-16 NOTE — Interval H&P Note (Signed)
 History and Physical Interval Note:  06/16/2023 1:25 PM  Melvin Ray  has presented today for surgery, with the diagnosis of HYPERPLASTIC POLYPS.  The various methods of treatment have been discussed with the patient and family. After consideration of risks, benefits and other options for treatment, the patient has consented to  Procedure(s) with comments: EGD (ESOPHAGOGASTRODUODENOSCOPY) (N/A) - 2:30PM;ASA 3 as a surgical intervention.  The patient's history has been reviewed, patient examined, no change in status, stable for surgery.  I have reviewed the patient's chart and labs.  Questions were answered to the patient's satisfaction.     Aaralyn Kil [name]

## 2023-06-16 NOTE — Discharge Instructions (Signed)
 You are being discharged to home.  Resume your previous diet.  Your physician has recommended a repeat upper endoscopy at the next available appointment because there was food in stomach. will need to stay in liquid diet for 1 days prior to procedure.

## 2023-06-16 NOTE — Anesthesia Preprocedure Evaluation (Signed)
 Anesthesia Evaluation  Patient identified by MRN, date of birth, ID band Patient awake    Reviewed: Allergy & Precautions, H&P , NPO status , Patient's Chart, lab work & pertinent test results, reviewed documented beta blocker date and time   Airway Mallampati: II  TM Distance: >3 FB Neck ROM: full    Dental no notable dental hx.    Pulmonary neg pulmonary ROS, sleep apnea , former smoker   Pulmonary exam normal breath sounds clear to auscultation       Cardiovascular Exercise Tolerance: Good hypertension, +CHF  negative cardio ROS  Rhythm:regular Rate:Normal     Neuro/Psych  PSYCHIATRIC DISORDERS  Depression    negative neurological ROS  negative psych ROS   GI/Hepatic negative GI ROS, Neg liver ROS,,,  Endo/Other  diabetes, Poorly Controlled, Type 2  Class 4 obesity  Renal/GU Renal diseasenegative Renal ROS  negative genitourinary   Musculoskeletal   Abdominal   Peds  Hematology negative hematology ROS (+) Blood dyscrasia, anemia   Anesthesia Other Findings   Reproductive/Obstetrics negative OB ROS                             Anesthesia Physical Anesthesia Plan  ASA: 4  Anesthesia Plan: General   Post-op Pain Management:    Induction:   PONV Risk Score and Plan: Propofol  infusion  Airway Management Planned:   Additional Equipment:   Intra-op Plan:   Post-operative Plan:   Informed Consent: I have reviewed the patients History and Physical, chart, labs and discussed the procedure including the risks, benefits and alternatives for the proposed anesthesia with the patient or authorized representative who has indicated his/her understanding and acceptance.     Dental Advisory Given  Plan Discussed with: CRNA  Anesthesia Plan Comments:        Anesthesia Quick Evaluation

## 2023-06-16 NOTE — Transfer of Care (Signed)
 Immediate Anesthesia Transfer of Care Note  Patient: Melvin Ray  Procedure(s) Performed: EGD (ESOPHAGOGASTRODUODENOSCOPY)  Patient Location: Short Stay  Anesthesia Type:General  Level of Consciousness: drowsy  Airway & Oxygen Therapy: Patient Spontanous Breathing  Post-op Assessment: Report given to RN and Post -op Vital signs reviewed and stable  Post vital signs: Reviewed and stable  Last Vitals:  Vitals Value Taken Time  BP    Temp    Pulse    Resp    SpO2      Last Pain:  Vitals:   06/16/23 1502  TempSrc: Oral  PainSc: 0-No pain         Complications: No notable events documented.

## 2023-06-16 NOTE — Op Note (Signed)
 [facility] Patient Name: Melvin Ray Procedure Date: 06/16/2023 2:43 PM MRN: [id number] Date of Birth: May 31, 1985 Attending MD: Samantha Cress , , [id number] CSN: [id number] Age: 38 Admit Type: Outpatient Procedure:                Upper GI endoscopy Indications:              For therapy of gastric polyps Providers:                Samantha Cress, Shelvy Dickens A. Valora Gear, RN, Karyle Pagoda, RN, Annell Barrow Referring MD:              Medicines:                Monitored Anesthesia Care Complications:            No immediate complications. Estimated Blood Loss:     Estimated blood loss: none. Procedure:                Pre-Anesthesia Assessment:                           - Prior to the procedure, a History and Physical                            was performed, and patient medications, allergies                            and sensitivities were reviewed. The patient's                            tolerance of previous anesthesia was reviewed.                           - The risks and benefits of the procedure and the                            sedation options and risks were discussed with the                            patient. All questions were answered and informed                            consent was obtained.                           - ASA Grade Assessment: III - A patient with severe                            systemic disease.                           After obtaining informed consent, the endoscope was                            passed under direct  vision. Throughout the                            procedure, the patient's blood pressure, pulse, and                            oxygen saturations were monitored continuously. The                            [id number]-H190 ([id number]) scope was introduced through the                            mouth, and advanced to the body of the stomach. The                            patient tolerated the procedure well.  The upper GI                            endoscopy was performed with difficulty due to                            presence of food. Scope In: 2:58:45 PM Scope Out: 2:59:45 PM Total Procedure Duration: 0 hours 1 minute 0 seconds  Findings:      The esophagus was normal.      A large amount of food (residue) was found in the gastric antrum. Scope       was withdrawn due to risk of aspiration. Impression:               - Normal esophagus.                           - A large amount of food (residue) in the stomach.                           - No specimens collected. Moderate Sedation:      Per Anesthesia Care Recommendation:           - Discharge patient to home (ambulatory).                           - Resume previous diet.                           - Repeat upper endoscopy at the next available                            appointment because there was food in stomach. will                            need to stay in liquid diet for 1 days prior to                            procedure. Procedure Code(s):        --- Professional ---  43235, 52, Esophagogastroduodenoscopy, flexible,                            transoral; diagnostic, including collection of                            specimen(s) by brushing or washing, when performed                            (separate procedure) Diagnosis Code(s):        --- Professional ---                           K31.7, Polyp of stomach and duodenum CPT copyright 2022 [employer]. All rights reserved. The codes documented in this report are preliminary and upon coder review may  be revised to meet current compliance requirements. Samantha Cress, MD Samantha Cress,  06/16/2023 3:07:19 PM This report has been signed electronically. Number of Addenda: 0

## 2023-06-17 ENCOUNTER — Telehealth (INDEPENDENT_AMBULATORY_CARE_PROVIDER_SITE_OTHER): Payer: Self-pay | Admitting: *Deleted

## 2023-06-17 ENCOUNTER — Encounter (HOSPITAL_COMMUNITY): Payer: Self-pay | Admitting: Gastroenterology

## 2023-06-17 NOTE — Telephone Encounter (Signed)
 Per EGD op note - Repeat upper endoscopy at the next available appointment because there was food in stomach. will need to stay in liquid diet for 1 days prior to procedure.

## 2023-06-18 NOTE — Telephone Encounter (Signed)
 Pt emergency contact Boykin Cable contacted. Pt scheduled for 07/24/23. Instructions will be mailed once pre op is received.

## 2023-06-19 NOTE — Anesthesia Postprocedure Evaluation (Signed)
 Anesthesia Post Note  Patient: Melvin Ray  Procedure(s) Performed: EGD (ESOPHAGOGASTRODUODENOSCOPY)  Patient location during evaluation: Phase II Anesthesia Type: General Level of consciousness: awake Pain management: pain level controlled Vital Signs Assessment: post-procedure vital signs reviewed and stable Respiratory status: spontaneous breathing and respiratory function stable Cardiovascular status: blood pressure returned to baseline and stable Postop Assessment: no headache and no apparent nausea or vomiting Anesthetic complications: no Comments: Late entry   No notable events documented.   Last Vitals:  Vitals:   06/16/23 1254 06/16/23 1502  BP: (!) 147/81 (!) 155/96  Pulse: 78 84  Resp: 15 (!) 29  Temp: 36.7 C 36.9 C  SpO2: 98% 93%    Last Pain:  Vitals:   06/16/23 1502  TempSrc: Oral  PainSc: 0-No pain                 Coretha Dew

## 2023-06-24 ENCOUNTER — Ambulatory Visit (INDEPENDENT_AMBULATORY_CARE_PROVIDER_SITE_OTHER): Admitting: Podiatry

## 2023-06-24 DIAGNOSIS — B351 Tinea unguium: Secondary | ICD-10-CM | POA: Diagnosis not present

## 2023-06-24 DIAGNOSIS — Z79899 Other long term (current) drug therapy: Secondary | ICD-10-CM | POA: Diagnosis not present

## 2023-06-24 NOTE — Progress Notes (Signed)
 Subjective:  Patient ID: Melvin Ray, male    DOB: September 13, 1985,  MRN: [id number]  Chief Complaint  Patient presents with   Nail Problem    Nail trim    38 y.o. male presents with the above complaint.  Patient presents with thickened dystrophic mycotic nail x 10.  Patient would like to discuss treatment options for nail fungus he has not seen anyone as part of same he denies any other acute complaints he is try some topical medication   Review of Systems: Negative except as noted in the HPI. Denies N/V/F/Ch.  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 (Patient taking differently: Take 5 mg by mouth daily.), Disp: 90 tablet, Rfl: 3   chlorthalidone  (HYGROTON ) 25 MG tablet, TAKE 1 TABLET ONCE DAILY., Disp: 90 tablet, Rfl: 1   Cholecalciferol  (VITAMIN D3) 50 MCG (2000 UT) TABS, Take 2,000 Units by mouth daily., Disp: , Rfl:    cloNIDine (CATAPRES) 0.3 MG tablet, Take 0.3 mg by mouth 2 (two) times daily., Disp: , Rfl:    diltiazem (CARDIZEM CD) 300 MG 24 hr capsule, Take 300 mg by mouth daily., Disp: , Rfl:    FEROSUL 325 (65 Fe) MG tablet, Take 325 mg by mouth daily., Disp: , Rfl:    FLUoxetine (PROZAC) 20 MG capsule, Take 20 mg by mouth daily., Disp: , Rfl:    Fluticasone-Salmeterol (ADVAIR) 250-50  MCG/DOSE AEPB, Inhale 1 puff into the lungs daily., Disp: , Rfl:    hydrALAZINE  (APRESOLINE ) 100 MG tablet, Take 100 mg by mouth 2 (two) times daily., Disp: , Rfl:    losartan (COZAAR) 100 MG tablet, Take 100 mg by mouth at bedtime., Disp: , Rfl:    metFORMIN  (GLUCOPHAGE -XR) 500 MG 24 hr tablet, Take 1,000 mg by mouth 2 (two) times daily with breakfast and lunch., Disp: , Rfl:    metoprolol (TOPROL-XL) 200 MG 24 hr tablet, Take 200 mg by mouth daily., Disp: , Rfl:    NON FORMULARY, Pt uses a cpap nightly, Disp: , Rfl:    pantoprazole (PROTONIX) 40 MG tablet, Take 40 mg by mouth daily., Disp: , Rfl:    potassium chloride  (KLOR-CON ) 10 MEQ tablet, Take 10 mEq by mouth daily., Disp: , Rfl:    Semaglutide , 2 MG/DOSE, (OZEMPIC , 2 MG/DOSE,) 8 MG/3ML SOPN, Inject 2 mg into the skin once a week. Needs apt with MD for more refills, Disp: 3 mL, Rfl: 2   traZODone (DESYREL) 100 MG tablet, Take 200 mg by mouth at bedtime. , Disp: , Rfl:   Social History   Tobacco Use  Smoking Status Former   Types: Cigars   Passive exposure: Current  Smokeless Tobacco Former    No Known Allergies Objective:  There  were no vitals filed for this visit. There is no height or weight on file to calculate BMI. Constitutional Well developed. Well nourished.  Vascular Dorsalis pedis pulses palpable bilaterally. Posterior tibial pulses palpable bilaterally. Capillary refill normal to all digits.  No cyanosis or clubbing noted. Pedal hair growth normal.  Neurologic Normal speech. Oriented to person, place, and time. Epicritic sensation to light touch grossly present bilaterally.  Dermatologic Nails  Thickened onychodystrophy mycotic toenails x 10 Skin within normal limits  Orthopedic: Normal joint ROM without pain or crepitus bilaterally. No visible deformities. No bony tenderness.   Radiographs: None Assessment:   1. Long-term use of high-risk medication    Plan:  Patient was evaluated and treated and all  questions answered.  Onychomycosis toenails x 10 -Educated the patient on the etiology of onychomycosis and various treatment options associated with improving the fungal load.  I explained to the patient that there is 3 treatment options available to treat the onychomycosis including topical, p.o., laser treatment.  Patient elected to undergo p.o. options with Lamisil/terbinafine therapy.  In order for me to start the medication therapy, I explained to the patient the importance of evaluating the liver and obtaining the liver function test.  Once the liver function test comes back normal I will start him on 72-month course of Lamisil therapy.  Patient understood all risk and would like to proceed with Lamisil therapy.  I have asked the patient to immediately stop the Lamisil therapy if she has any reactions to it and call the office or go to the emergency room right away.  Patient states understanding   Return in about 3 months (around 09/23/2023) for Routine Foot Care.

## 2023-07-08 ENCOUNTER — Other Ambulatory Visit: Payer: Self-pay | Admitting: Podiatry

## 2023-07-09 LAB — LIPID PANEL
Chol/HDL Ratio: 4.8 ratio (ref 0.0–5.0)
Cholesterol, Total: 171 mg/dL (ref 100–199)
HDL: 36 mg/dL — ABNORMAL LOW (ref >39)
LDL Chol Calc (NIH): 100 mg/dL — ABNORMAL HIGH (ref 0–99)
Triglycerides: 201 mg/dL — ABNORMAL HIGH (ref 0–149)
VLDL Cholesterol Cal: 35 mg/dL (ref 5–40)

## 2023-07-09 LAB — COMPREHENSIVE METABOLIC PANEL WITH GFR
ALT: 27 IU/L (ref 0–44)
AST: 20 IU/L (ref 0–40)
Albumin: 3.5 g/dL — ABNORMAL LOW (ref 4.1–5.1)
Alkaline Phosphatase: 75 IU/L (ref 44–121)
BUN/Creatinine Ratio: 9 (ref 9–20)
BUN: 12 mg/dL (ref 6–20)
Bilirubin Total: 0.4 mg/dL (ref 0.0–1.2)
CO2: 30 mmol/L — ABNORMAL HIGH (ref 20–29)
Calcium: 9.9 mg/dL (ref 8.7–10.2)
Chloride: 96 mmol/L (ref 96–106)
Creatinine, Ser: 1.31 mg/dL — ABNORMAL HIGH (ref 0.76–1.27)
Globulin, Total: 3.3 g/dL (ref 1.5–4.5)
Glucose: 269 mg/dL — ABNORMAL HIGH (ref 70–99)
Potassium: 3.7 mmol/L (ref 3.5–5.2)
Sodium: 141 mmol/L (ref 134–144)
Total Protein: 6.8 g/dL (ref 6.0–8.5)
eGFR: 72 mL/min/{1.73_m2} (ref >59)

## 2023-07-09 LAB — HEPATIC FUNCTION PANEL
ALT: 27 IU/L (ref 0–44)
AST: 16 IU/L (ref 0–40)
Albumin: 3.9 g/dL — ABNORMAL LOW (ref 4.1–5.1)
Alkaline Phosphatase: 78 IU/L (ref 44–121)
Bilirubin Total: 0.3 mg/dL (ref 0.0–1.2)
Bilirubin, Direct: 0.14 mg/dL (ref 0.00–0.40)
Total Protein: 6.6 g/dL (ref 6.0–8.5)

## 2023-07-09 LAB — SPECIMEN STATUS REPORT

## 2023-07-09 LAB — TSH: TSH: 2.79 u[IU]/mL (ref 0.450–4.500)

## 2023-07-09 LAB — T4, FREE: Free T4: 1.26 ng/dL (ref 0.82–1.77)

## 2023-07-09 MED ORDER — TERBINAFINE HCL 250 MG PO TABS: 250.0000 mg | ORAL_TABLET | Freq: Every day | ORAL | 0 refills | Status: AC

## 2023-07-09 NOTE — Addendum Note (Signed)
 Addended by: Tiwanna Tuch on: 07/09/2023 10:44 AM   Modules accepted: Orders

## 2023-07-15 ENCOUNTER — Encounter: Payer: Self-pay | Admitting: "Endocrinology

## 2023-07-15 ENCOUNTER — Ambulatory Visit (INDEPENDENT_AMBULATORY_CARE_PROVIDER_SITE_OTHER): Payer: Medicaid Other | Admitting: "Endocrinology

## 2023-07-15 VITALS — BP 134/86 | HR 84 | Ht 72.0 in | Wt 399.8 lb

## 2023-07-15 DIAGNOSIS — Z794 Long term (current) use of insulin: Secondary | ICD-10-CM | POA: Diagnosis not present

## 2023-07-15 DIAGNOSIS — N182 Chronic kidney disease, stage 2 (mild): Secondary | ICD-10-CM | POA: Diagnosis not present

## 2023-07-15 DIAGNOSIS — E1122 Type 2 diabetes mellitus with diabetic chronic kidney disease: Secondary | ICD-10-CM

## 2023-07-15 DIAGNOSIS — Z7985 Long-term (current) use of injectable non-insulin antidiabetic drugs: Secondary | ICD-10-CM

## 2023-07-15 DIAGNOSIS — F17200 Nicotine dependence, unspecified, uncomplicated: Secondary | ICD-10-CM

## 2023-07-15 DIAGNOSIS — I10 Essential (primary) hypertension: Secondary | ICD-10-CM

## 2023-07-15 DIAGNOSIS — E782 Mixed hyperlipidemia: Secondary | ICD-10-CM | POA: Diagnosis not present

## 2023-07-15 LAB — POCT GLYCOSYLATED HEMOGLOBIN (HGB A1C): HbA1c, POC (controlled diabetic range): 7.4 % — AB (ref 0.0–7.0)

## 2023-07-15 MED ORDER — METFORMIN HCL ER 500 MG PO TB24: 500.0000 mg | ORAL_TABLET | Freq: Every day | ORAL | 1 refills | Status: DC

## 2023-07-15 MED ORDER — EMPAGLIFLOZIN 10 MG PO TABS: 10.0000 mg | ORAL_TABLET | Freq: Every day | ORAL | 1 refills | Status: DC

## 2023-07-15 NOTE — Patient Instructions (Signed)

## 2023-07-15 NOTE — Addendum Note (Signed)
 Addended by: Ernst Heap on: 07/15/2023 02:23 PM   Modules accepted: Orders

## 2023-07-15 NOTE — Progress Notes (Signed)
 07/15/2023, 12:50 PM  Endocrinology follow-up note   Subjective:    Patient ID: Melvin Ray, male    DOB: 1986-01-11.  Alexzavier Girardin is being seen in follow up after he was sen in consultation for management of currently uncontrolled symptomatic diabetes requested by  Elam Gray, MD. He is assisted and accompanied by his case manager Johnny.   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   SVT (supraventricular tachycardia) (HCC)    Tobacco abuse     Past Surgical History:  Procedure Laterality Date   APPENDECTOMY     BIOPSY  01/07/2022   Procedure: BIOPSY;  Surgeon: Urban Garden, MD;  [facility];  Service: Gastroenterology;;   COLONOSCOPY WITH PROPOFOL  N/A 01/07/2022   Procedure: COLONOSCOPY WITH PROPOFOL ;  Surgeon: Urban Garden, MD;  [facility]: [facility];  Service: Gastroenterology;  Laterality: N/A;  1030 ASA 3   ESOPHAGOGASTRODUODENOSCOPY  01/07/2022   Procedure: ESOPHAGOGASTRODUODENOSCOPY (EGD);  Surgeon: Umberto Ganong, Bearl Limes, MD;  [facility]: [facility];  Service: Gastroenterology;;   ESOPHAGOGASTRODUODENOSCOPY N/A 06/16/2023   Procedure: EGD (ESOPHAGOGASTRODUODENOSCOPY);  Surgeon: Umberto Ganong, Bearl Limes, MD;  [facility]: [facility];  Service: Gastroenterology;  Laterality: N/A;  2:30PM;ASA 3   GIVENS CAPSULE STUDY N/A 02/05/2022   Procedure: GIVENS CAPSULE STUDY;  Surgeon: Urban Garden, MD;  [facility]: [facility];  Service: Gastroenterology;  Laterality: N/A;  8:30am    Social History   Socioeconomic History   Marital status: Single    Spouse name: Not on file   Number of children: Not on file   Years of education: Not on  file   Highest education level: Not on file  Occupational History   Not on file  Tobacco Use   Smoking status: Former    Types: Cigars    Passive exposure: Current   Smokeless tobacco: Former  Building services engineer status: Every Day  Substance and Sexual Activity   Alcohol use: Never   Drug use: Never   Sexual activity: Not on file  Other Topics Concern   Not on file  Social History Narrative   Not on file   Social Drivers of Health   Financial Resource Strain: Medium Risk (03/24/2022)   Overall Financial Resource Strain (CARDIA)    Difficulty of Paying Living Expenses: Somewhat hard  Food Insecurity: No Food Insecurity (03/24/2022)   Hunger Vital Sign    Worried About Running Out of Food in the Last Year: Never true    Ran Out of Food in the Last Year: Never true  Transportation Needs: No Transportation Needs (03/24/2022)   PRAPARE - Administrator, Civil Service (Medical): No    Lack of Transportation (Non-Medical): No  Physical Activity: Insufficiently Active (07/31/2020)   Exercise Vital Sign    Days of Exercise per Week: 3 days    Minutes of  Exercise per Session: 40 min  Stress: Not on file  Social Connections: Not on file    Family History  Problem Relation Age of Onset   Heart failure Father    Hypertension Father    Diabetes Maternal Grandmother    Heart failure Paternal Grandmother        transplant    Outpatient Encounter Medications as of 07/15/2023  Medication Sig   empagliflozin (JARDIANCE) 10 MG TABS tablet Take 1 tablet (10 mg total) by mouth daily before breakfast.   metFORMIN  (GLUCOPHAGE -XR) 500 MG 24 hr tablet Take 1 tablet (500 mg total) by mouth daily with breakfast.   ACCU-CHEK GUIDE test strip 3 (three) times daily.   Accu-Chek Softclix Lancets lancets 3 (three) times daily.   albuterol (VENTOLIN HFA) 108 (90 Base) MCG/ACT inhaler Inhale 1-2 puffs into the lungs every 6 (six) hours as needed for wheezing or shortness of breath.    amLODipine  (NORVASC ) 2.5 MG tablet Take 1 tablet (2.5 mg total) by mouth daily. (Patient taking differently: Take 5 mg by mouth daily.)   chlorthalidone  (HYGROTON ) 25 MG tablet TAKE 1 TABLET ONCE DAILY.   Cholecalciferol  (VITAMIN D3) 50 MCG (2000 UT) TABS Take 2,000 Units by mouth daily.   cloNIDine (CATAPRES) 0.3 MG tablet Take 0.3 mg by mouth 2 (two) times daily.   diltiazem (CARDIZEM CD) 300 MG 24 hr capsule Take 300 mg by mouth daily.   FEROSUL 325 (65 Fe) MG tablet Take 325 mg by mouth daily.   FLUoxetine (PROZAC) 20 MG capsule Take 20 mg by mouth daily.   Fluticasone-Salmeterol (ADVAIR) 250-50 MCG/DOSE AEPB Inhale 1 puff into the lungs daily.   hydrALAZINE  (APRESOLINE ) 100 MG tablet Take 100 mg by mouth 2 (two) times daily.   losartan (COZAAR) 100 MG tablet Take 100 mg by mouth at bedtime.   metoprolol (TOPROL-XL) 200 MG 24 hr tablet Take 200 mg by mouth daily.   NON FORMULARY Pt uses a cpap nightly   pantoprazole (PROTONIX) 40 MG tablet Take 40 mg by mouth daily.   potassium chloride  (KLOR-CON ) 10 MEQ tablet Take 10 mEq by mouth daily.   Semaglutide , 2 MG/DOSE, (OZEMPIC , 2 MG/DOSE,) 8 MG/3ML SOPN Inject 2 mg into the skin once a week. Needs apt with MD for more refills   terbinafine  (LAMISIL ) 250 MG tablet Take 1 tablet (250 mg total) by mouth daily.   traZODone (DESYREL) 100 MG tablet Take 200 mg by mouth at bedtime.    [DISCONTINUED] metFORMIN  (GLUCOPHAGE -XR) 500 MG 24 hr tablet Take 1,000 mg by mouth 2 (two) times daily with breakfast and lunch. (Patient not taking: Reported on 07/15/2023)   No facility-administered encounter medications on file as of 07/15/2023.    ALLERGIES: No Known Allergies  VACCINATION STATUS: Immunization History  Administered Date(s) Administered   Influenza Split 02/15/2019   Tdap 07/16/2015    Diabetes He presents for his follow-up diabetic visit. He has type 2 diabetes mellitus. Onset time: He was diagnosed through approximate age of 38 years. His  disease course has been worsening. There are no hypoglycemic associated symptoms. Pertinent negatives for hypoglycemia include no headaches, seizures or tremors. Pertinent negatives for diabetes include no blurred vision, no chest pain, no polydipsia, no polyuria and no weight loss. There are no hypoglycemic complications. Symptoms are worsening. Diabetic complications include nephropathy. Risk factors for coronary artery disease include dyslipidemia, diabetes mellitus, family history, male sex, obesity, hypertension, tobacco exposure and sedentary lifestyle. Current diabetic treatment includes insulin injections. His weight is  fluctuating minimally. He is following a generally unhealthy diet. When asked about meal planning, he reported none. He has not had a previous visit with a dietitian. He never participates in exercise. His home blood glucose trend is fluctuating minimally. Royston Cornea presents without any meter nor logs.   significantly improved glycemic profile.  He is accompanied by his case Production designer, theatre/television/film.   His point-of-care A1c is 7.4% increasing from 6.6%.  He does not report hypoglycemia.    ) An ACE inhibitor/angiotensin II receptor blocker is being taken. He does not see a podiatrist.Eye exam is not current.  Hyperlipidemia This is a chronic problem. The current episode started more than 1 year ago. Exacerbating diseases include chronic renal disease, diabetes and obesity. Pertinent negatives include no chest pain, myalgias or shortness of breath. Risk factors for coronary artery disease include dyslipidemia, diabetes mellitus, family history, obesity, male sex, hypertension and a sedentary lifestyle.  Hypertension This is a chronic problem. The current episode started more than 1 year ago. Pertinent negatives include no blurred vision, chest pain, headaches, palpitations or shortness of breath. Risk factors for coronary artery disease include dyslipidemia, diabetes mellitus, family history, obesity,  male gender, sedentary lifestyle and smoking/tobacco exposure. Past treatments include central alpha agonists and angiotensin blockers. Identifiable causes of hypertension include chronic renal disease.     Objective:       07/15/2023   11:09 AM 06/16/2023    3:02 PM 06/16/2023   12:54 PM  Vitals with BMI  Height 6\' 0"   6\' 0"   Weight 399 lbs 13 oz  403 lbs 10 oz  BMI 54.21  54.73  Systolic 134 155 213  Diastolic 86 96 81  Pulse 84 84 78    BP 134/86   Pulse 84   Ht 6' (1.829 m)   Wt (!) 399 lb 12.8 oz (181.3 kg)   BMI 54.22 kg/m   Wt Readings from Last 3 Encounters:  07/15/23 (!) 399 lb 12.8 oz (181.3 kg)  06/16/23 (!) 403 lb 9.6 oz (183.1 kg)  06/11/23 (!) 403 lb 9.6 oz (183.1 kg)       Diabetic Labs (most recent): Lab Results  Component Value Date   HGBA1C 6.6 03/17/2023   HGBA1C 7.4 (A) 06/25/2022   HGBA1C 10.2 07/23/2021   MICROALBUR 7.8 07/23/2021      Recent Results (from the past 2160 hours)  Fe+TIBC+Fer     Status: Abnormal   Collection Time: 05/28/23 10:33 AM  Result Value Ref Range   Total Iron Binding Capacity 245 (L) 250 - 450 ug/dL   UIBC 086 578 - 469 ug/dL   Iron 42 38 - 629 ug/dL   Iron Saturation 17 15 - 55 %   Ferritin 210 30 - 400 ng/mL  CBC with Differential/Platelet     Status: Abnormal   Collection Time: 06/11/23  1:20 PM  Result Value Ref Range   WBC 8.7 4.0 - 10.5 K/uL   RBC 4.66 4.22 - 5.81 MIL/uL   Hemoglobin 12.6 (L) 13.0 - 17.0 g/dL   HCT 52.8 41.3 - 24.4 %   MCV 86.9 80.0 - 100.0 fL   MCH 27.0 26.0 - 34.0 pg   MCHC 31.1 30.0 - 36.0 g/dL   RDW 01.0 27.2 - 53.6 %   Platelets 240 150 - 400 K/uL   nRBC 0.0 0.0 - 0.2 %   Neutrophils Relative % 55 %   Neutro Abs 4.8 1.7 - 7.7 K/uL   Lymphocytes Relative 33 %  Lymphs Abs 2.9 0.7 - 4.0 K/uL   Monocytes Relative 9 %   Monocytes Absolute 0.8 0.1 - 1.0 K/uL   Eosinophils Relative 2 %   Eosinophils Absolute 0.2 0.0 - 0.5 K/uL   Basophils Relative 0 %   Basophils Absolute 0.0  0.0 - 0.1 K/uL   Immature Granulocytes 1 %   Abs Immature Granulocytes 0.04 0.00 - 0.07 K/uL    Comment: Performed at [facility], [address] [place], [place] [address]  Basic metabolic panel     Status: Abnormal   Collection Time: 06/11/23  1:20 PM  Result Value Ref Range   Sodium 137 135 - 145 mmol/L   Potassium 3.2 (L) 3.5 - 5.1 mmol/L   Chloride 95 (L) 98 - 111 mmol/L   CO2 27 22 - 32 mmol/L   Glucose, Bld 108 (H) 70 - 99 mg/dL    Comment: Glucose reference range applies only to samples taken after fasting for at least 8 hours.   BUN 16 6 - 20 mg/dL   Creatinine, Ser 3.08 0.61 - 1.24 mg/dL   Calcium 9.6 8.9 - 65.7 mg/dL   GFR, Estimated >84 >69 mL/min    Comment: (NOTE) Calculated using the CKD-EPI Creatinine Equation (2021)    Anion gap 15 5 - 15    Comment: Performed at [facility], [address], [place], [place] [address]  Glucose, capillary     Status: Abnormal   Collection Time: 06/16/23 12:49 PM  Result Value Ref Range   Glucose-Capillary 158 (H) 70 - 99 mg/dL    Comment: Glucose reference range applies only to samples taken after fasting for at least 8 hours.  Comprehensive metabolic panel     Status: Abnormal   Collection Time: 07/08/23 10:23 AM  Result Value Ref Range   Glucose 269 (H) 70 - 99 mg/dL   BUN 12 6 - 20 mg/dL   Creatinine, Ser 8.41 (H) 0.76 - 1.27 mg/dL   eGFR 72 >32 GM/WNU/2.72   BUN/Creatinine Ratio 9 9 - 20   Sodium 141 134 - 144 mmol/L   Potassium 3.7 3.5 - 5.2 mmol/L   Chloride 96 96 - 106 mmol/L   CO2 30 (H) 20 - 29 mmol/L   Calcium 9.9 8.7 - 10.2 mg/dL   Total Protein 6.8 6.0 - 8.5 g/dL   Albumin 3.5 (L) 4.1 - 5.1 g/dL   Globulin, Total 3.3 1.5 - 4.5 g/dL   Bilirubin Total 0.4 0.0 - 1.2 mg/dL   Alkaline Phosphatase 75 44 - 121 IU/L   AST 20 0 - 40 IU/L   ALT 27 0 - 44 IU/L  Lipid panel     Status: Abnormal   Collection Time: 07/08/23 10:23 AM  Result Value Ref Range   Cholesterol, Total 171 100 - 199 mg/dL   Triglycerides 536  (H) 0 - 149 mg/dL   HDL 36 (L) >64 mg/dL   VLDL Cholesterol Cal 35 5 - 40 mg/dL   LDL Chol Calc (NIH) 403 (H) 0 - 99 mg/dL   Chol/HDL Ratio 4.8 0.0 - 5.0 ratio    Comment:                                   T. Chol/HDL Ratio  Men  Women                               1/2 Avg.Risk  3.4    3.3                                   Avg.Risk  5.0    4.4                                2X Avg.Risk  9.6    7.1                                3X Avg.Risk 23.4   11.0   TSH     Status: None   Collection Time: 07/08/23 10:23 AM  Result Value Ref Range   TSH 2.790 0.450 - 4.500 uIU/mL  T4, free     Status: None   Collection Time: 07/08/23 10:23 AM  Result Value Ref Range   Free T4 1.26 0.82 - 1.77 ng/dL  Hepatic function panel     Status: Abnormal   Collection Time: 07/08/23 10:26 AM  Result Value Ref Range   Total Protein 6.6 6.0 - 8.5 g/dL   Albumin 3.9 (L) 4.1 - 5.1 g/dL   Bilirubin Total 0.3 0.0 - 1.2 mg/dL   Bilirubin, Direct 2.95 0.00 - 0.40 mg/dL   Alkaline Phosphatase 78 44 - 121 IU/L   AST 16 0 - 40 IU/L   ALT 27 0 - 44 IU/L  Specimen status report     Status: None   Collection Time: 07/08/23 10:26 AM  Result Value Ref Range   specimen status report Comment     Comment: [place] Rain HFP7 Default Ambig Abbrev HFP7 Default A hand-written panel/profile was received from your office. In accordance with the [facility] Ambiguous Test Code Policy dated July 2003, we have completed your order by using the closest currently or formerly recognized AMA panel.  We have assigned Hepatic Function Panel (7), Test Code [phone] to this request.  If this is not the testing you wished to receive on this specimen, please contact the [facility] Client Inquiry/Technical Services Department to clarify the test order.  We appreciate your business.    Lipid Panel     Component Value Date/Time   CHOL 171 07/08/2023 1023   TRIG 201 (H) 07/08/2023 1023   HDL 36 (L)  07/08/2023 1023   CHOLHDL 4.8 07/08/2023 1023   LDLCALC 100 (H) 07/08/2023 1023   LABVLDL 35 07/08/2023 1023     Assessment & Plan:   1. Type 2 diabetes mellitus with stage 2 chronic kidney disease, with long-term current use of insulin (HCC)   - Rhythm Wigfall has currently uncontrolled symptomatic type 2 DM since  38 years of age. Recent labs reviewed.  Arjay presents without any meter nor logs.   significantly improved glycemic profile.  He is accompanied by his case Production designer, theatre/television/film.   His point-of-care A1c is 7.4% increasing from 6.6%.  He does not report hypoglycemia.     - I had a long discussion with him about the progressive nature of diabetes and the pathology behind its complications. -his diabetes is complicated by CKD, obesity/sedentary life, smoking and he remains at a high risk  for more acute and chronic complications which include CAD, CVA, CKD, retinopathy, and neuropathy. These are all discussed in detail with him.  - I have counseled him on diet  and weight management  by adopting a carbohydrate restricted/protein rich diet. Patient is encouraged to switch to  unprocessed or minimally processed     complex starch and increased protein intake (animal or plant source), fruits, and vegetables. -  he is advised to stick to a routine mealtimes to eat 3 meals  a day and avoid unnecessary snacks ( to snack only to correct hypoglycemia).  -He has regained most of the weight he lost when he was dealing with severe hyperglycemia with glycosuria. - he acknowledges that there is a room for improvement in his food and drink choices. - Suggestion is made for him to avoid simple carbohydrates  from his diet including Cakes, Sweet Desserts, Ice Cream, Soda (diet and regular), Sweet Tea, Candies, Chips, Cookies, Store Bought Juices, Alcohol in Excess of  1-2 drinks a day, Artificial Sweeteners,  Coffee Creamer, and "Sugar-free" Products, Lemonade. This will help patient to have more stable blood  glucose profile and potentially avoid unintended weight gain.   - he has been  scheduled with Penny Crumpton, RDN, CDE for diabetes education.  - I have approached him with the following individualized plan to manage  his diabetes and patient agrees:   -In light of his presentation with near target A1c at 7.4%, he will not need insulin to manage his diabetic at this time.     He is advised to continue Ozempic  2 mg subcutaneously weekly, discussed and lowered his metformin  to 500 mg XR p.o. daily at breakfast.    He will benefit from [place].  I discussed and prescribed Jardiance 10 mg p.o. daily at breakfast.  Side effects and precautions discussed with him.  He is particularly urged to keep good Personal hygiene to avoid genital rash and urinary tract infections.    He is encouraged to monitor blood glucose twice a day-daily before breakfast and at bedtime.     he is encouraged to call clinic for blood glucose levels less than 70 or above 200 mg /dl.  -He is counseled for  smoking cessation, he is too high risk to's give incretin therapy due to his risk of pancreatitis.  Tragically, patient continues to smoke.  He has obstructive sleep apnea would benefit the most from weight loss and smoking cessation. The patient was counseled on the dangers of tobacco use, and was advised to quit.  Reviewed strategies to maximize success, including removing cigarettes and smoking materials from environment.  - Specific targets for  A1c;  LDL, HDL,  and Triglycerides were discussed with the patient.  2) Blood Pressure /Hypertension:  - His blood pressure is controlled to target.  He typically does not take his blood pressure medications in the morning.       he is advised to continue his current medications including  chlorthalidone  25 mg p.o. daily, clonidine 0.1 mg p.o. twice daily, hydralazine  50 mg p.o. 3 times daily, Losartan 100 mg p.o. every morning, metoprolol 200 mg p.o. daily.    Admittedly, he consumes salty food, advised to limit salt consumption.   3) Lipids/Hyperlipidemia: Recent lipid panel showed LDL still high at 100.   He wishes to  avoid statin medications at this time.  He will be considered for repeat fasting lipid panel and will be reapproached for low-dose statin initiation on subsequent visits.  4)  Weight/Diet:  Body mass index is 54.22 kg/m.  -He has achieved 15+ pounds of weight loss since last visit.    he is  a candidate for modest weight loss. I discussed with him the fact that loss of 5 - 10% of his  current body weight will have the most impact on his diabetes management.  Exercise, and detailed carbohydrates information provided  -  detailed on discharge instructions.  5) Chronic Care/Health Maintenance:  -he  is on ARB medications and  is encouraged to initiate and continue to follow up with Ophthalmology, Dentist,  Podiatrist at least yearly or according to recommendations, and advised to  quit smoking. I have recommended yearly flu vaccine and pneumonia vaccine at least every 5 years; moderate intensity exercise for up to 150 minutes weekly; and  sleep for at least 7 hours a day.  - he is  advised to maintain close follow up with Elam Gray, MD for primary care needs, as well as his other providers for optimal and coordinated care.   I spent  26  minutes in the care of the patient today including review of labs from CMP, Lipids, Thyroid Function, Hematology (current and previous including abstractions from other facilities); face-to-face time discussing  his blood glucose readings/logs, discussing hypoglycemia and hyperglycemia episodes and symptoms, medications doses, his options of short and long term treatment based on the latest standards of care / guidelines;  discussion about incorporating lifestyle medicine;  and documenting the encounter. Risk reduction counseling performed per USPSTF guidelines to reduce  obesity and  cardiovascular risk factors.     Please refer to Patient Instructions for Blood Glucose Monitoring and Insulin/Medications Dosing Guide"  in media tab for additional information. Please  also refer to " Patient Self Inventory" in the Media  tab for reviewed elements of pertinent patient history.  Tollie Fought participated in the discussions, expressed understanding, and voiced agreement with the above plans.  All questions were answered to his satisfaction. he is encouraged to contact clinic should he have any questions or concerns prior to his return visit.   Follow up plan: - Return in about 6 months (around 01/15/2024) for F/U with Pre-visit Labs, Meter/CGM/Logs, A1c here.  Kalvin Orf, MD [facility] [address] [place], [place] [address] Phone: [phone]  Fax: [phone]    07/15/2023, 12:50 PM  This note was partially dictated with voice recognition software. Similar sounding words can be transcribed inadequately or may not  be corrected upon review.

## 2023-07-16 ENCOUNTER — Other Ambulatory Visit (HOSPITAL_COMMUNITY): Payer: Self-pay

## 2023-07-21 NOTE — Patient Instructions (Signed)
 Melvin Ray  07/21/2023     @PREFPERIOPPHARMACY @   Your procedure is scheduled on  07/24/2023.   Report to [facility] at  0815 A.M.   Call this number if you have problems the morning of surgery:  [phone]  If you experience any cold or flu symptoms such as cough, fever, chills, shortness of breath, etc. between now and your scheduled surgery, please notify us  at the above number.   Remember: Your last dose of semaglutide  should have been on 07/16/2023.         DO NOT take any medications for diabetes the morning of your procedure.   Follow the diet instructions given to you by the office.   You may drink clear liquids until 0500 am on 07/24/2023.    Clear liquids allowed are:                    Water , Juice (No red color; non-citric and without pulp; diabetics please choose diet or no sugar options), Carbonated beverages (diabetics please choose diet or no sugar options), Clear Tea (No creamer, milk, or cream, including half & half and powdered creamer), Black Coffee Only (No creamer, milk or cream, including half & half and powdered creamer), and Clear Sports drink (No red color; diabetics please choose diet or no sugar options)    Take these medicines the morning of surgery with A SIP OF WATER           amlodipine , clonidine, diltiazem, fluoxetine, metoprolol, pantoprazole.           Use your inhaler before you come and bring your rescue inhaler with you.    Do not wear jewelry, make-up or nail polish, including gel polish,  artificial nails, or any other type of covering on natural nails (fingers and  toes).  Do not wear lotions, powders, or perfumes, or deodorant.  Do not shave 48 hours prior to surgery.  Men may shave face and neck.  Do not bring valuables to the hospital.  [facility] is not responsible for any belongings or valuables.  Contacts, dentures or bridgework may not be worn into surgery.  Leave your suitcase in the car.  After surgery it may be  brought to your room.  For patients admitted to the hospital, discharge time will be determined by your treatment team.  Patients discharged the day of surgery will not be allowed to drive home and must have someone with them for 24 hours.    Special instructions:   DO NOT smoke tobacco or vape for 24 hours before your procedure.  Please read over the following fact sheets that you were given. Anesthesia Post-op Instructions and Care and Recovery After Surgery      Upper Endoscopy, Adult, Care After After the procedure, it is common to have a sore throat. It is also common to have: Mild stomach pain or discomfort. Bloating. Nausea. Follow these instructions at home: The instructions below may help you care for yourself at home. Your health care provider may give you more instructions. If you have questions, ask your health care provider. If you were given a sedative during the procedure, it can affect you for several hours. Do not drive or operate machinery until your health care provider says that it is safe. If you will be going home right after the procedure, plan to have a responsible adult: Take you home from the hospital or clinic. You will not be allowed to  drive. Care for you for the time you are told. Follow instructions from your health care provider about what you may eat and drink. Return to your normal activities as told by your health care provider. Ask your health care provider what activities are safe for you. Take over-the-counter and prescription medicines only as told by your health care provider. Contact a health care provider if you: Have a sore throat that lasts longer than one day. Have trouble swallowing. Have a fever. Get help right away if you: Vomit blood or your vomit looks like coffee grounds. Have bloody, black, or tarry stools. Have a very bad sore throat or you cannot swallow. Have difficulty breathing or very bad pain in your chest or  abdomen. These symptoms may be an emergency. Get help right away. Call 911. Do not wait to see if the symptoms will go away. Do not drive yourself to the hospital. Summary After the procedure, it is common to have a sore throat, mild stomach discomfort, bloating, and nausea. If you were given a sedative during the procedure, it can affect you for several hours. Do not drive until your health care provider says that it is safe. Follow instructions from your health care provider about what you may eat and drink. Return to your normal activities as told by your health care provider. This information is not intended to replace advice given to you by your health care provider. Make sure you discuss any questions you have with your health care provider. Document Revised: 05/22/2021 Document Reviewed: 05/22/2021 Elsevier Patient Education  2024 [employer].General Anesthesia, Adult, Care After The following information offers guidance on how to care for yourself after your procedure. Your health care provider may also give you more specific instructions. If you have problems or questions, contact your health care provider. What can I expect after the procedure? After the procedure, it is common for people to: Have pain or discomfort at the IV site. Have nausea or vomiting. Have a sore throat or hoarseness. Have trouble concentrating. Feel cold or chills. Feel weak, sleepy, or tired (fatigue). Have soreness and body aches. These can affect parts of the body that were not involved in surgery. Follow these instructions at home: For the time period you were told by your health care provider:  Rest. Do not participate in activities where you could fall or become injured. Do not drive or use machinery. Do not drink alcohol. Do not take sleeping pills or medicines that cause drowsiness. Do not make important decisions or sign legal documents. Do not take care of children on your own. General  instructions Drink enough fluid to keep your urine pale yellow. If you have sleep apnea, surgery and certain medicines can increase your risk for breathing problems. Follow instructions from your health care provider about wearing your sleep device: Anytime you are sleeping, including during daytime naps. While taking prescription pain medicines, sleeping medicines, or medicines that make you drowsy. Return to your normal activities as told by your health care provider. Ask your health care provider what activities are safe for you. Take over-the-counter and prescription medicines only as told by your health care provider. Do not use any products that contain nicotine or tobacco. These products include cigarettes, chewing tobacco, and vaping devices, such as e-cigarettes. These can delay incision healing after surgery. If you need help quitting, ask your health care provider. Contact a health care provider if: You have nausea or vomiting that does not get better with medicine.  You vomit every time you eat or drink. You have pain that does not get better with medicine. You cannot urinate or have bloody urine. You develop a skin rash. You have a fever. Get help right away if: You have trouble breathing. You have chest pain. You vomit blood. These symptoms may be an emergency. Get help right away. Call 911. Do not wait to see if the symptoms will go away. Do not drive yourself to the hospital. Summary After the procedure, it is common to have a sore throat, hoarseness, nausea, vomiting, or to feel weak, sleepy, or fatigue. For the time period you were told by your health care provider, do not drive or use machinery. Get help right away if you have difficulty breathing, have chest pain, or vomit blood. These symptoms may be an emergency. This information is not intended to replace advice given to you by your health care provider. Make sure you discuss any questions you have with your health  care provider. Document Revised: 05/10/2021 Document Reviewed: 05/10/2021 Elsevier Patient Education  2024 [employer].

## 2023-07-22 ENCOUNTER — Encounter (HOSPITAL_COMMUNITY): Payer: Self-pay

## 2023-07-22 ENCOUNTER — Encounter (HOSPITAL_COMMUNITY)
Admission: RE | Admit: 2023-07-22 | Discharge: 2023-07-22 | Disposition: A | Discharge status: Home / Self Care | Source: Ambulatory Visit | Attending: Gastroenterology | Admitting: Gastroenterology

## 2023-07-22 DIAGNOSIS — E1122 Type 2 diabetes mellitus with diabetic chronic kidney disease: Secondary | ICD-10-CM

## 2023-07-22 DIAGNOSIS — F17200 Nicotine dependence, unspecified, uncomplicated: Secondary | ICD-10-CM

## 2023-07-22 DIAGNOSIS — I1A0 Resistant hypertension: Secondary | ICD-10-CM

## 2023-07-24 ENCOUNTER — Telehealth: Payer: Self-pay | Admitting: *Deleted

## 2023-07-24 ENCOUNTER — Ambulatory Visit (HOSPITAL_COMMUNITY)
Admission: RE | Admit: 2023-07-24 | Discharge: 2023-07-24 | Disposition: A | Discharge status: Home / Self Care | Attending: Gastroenterology | Admitting: Gastroenterology

## 2023-07-24 ENCOUNTER — Encounter (HOSPITAL_COMMUNITY)
Admission: RE | Disposition: A | Discharge status: Home / Self Care | Payer: Self-pay | Source: Home / Self Care | Attending: Gastroenterology

## 2023-07-24 DIAGNOSIS — F17200 Nicotine dependence, unspecified, uncomplicated: Secondary | ICD-10-CM

## 2023-07-24 DIAGNOSIS — I1A0 Resistant hypertension: Secondary | ICD-10-CM

## 2023-07-24 DIAGNOSIS — E1122 Type 2 diabetes mellitus with diabetic chronic kidney disease: Secondary | ICD-10-CM

## 2023-07-24 SURGERY — EGD (ESOPHAGOGASTRODUODENOSCOPY)
Anesthesia: Choice

## 2023-07-24 MED ORDER — LACTATED RINGERS IV SOLN: INTRAVENOUS | Status: DC

## 2023-07-24 NOTE — Telephone Encounter (Signed)
 Melvin Ray June 19, 1985 MRN [id number] care giver called and said he was unable to get pt to appointment today and needs to reschedule

## 2023-07-28 ENCOUNTER — Telehealth: Payer: Self-pay

## 2023-07-28 ENCOUNTER — Encounter: Payer: Self-pay | Admitting: *Deleted

## 2023-07-28 ENCOUNTER — Other Ambulatory Visit (HOSPITAL_COMMUNITY): Payer: Self-pay

## 2023-07-28 NOTE — Telephone Encounter (Signed)
 Pharmacy Patient Advocate Encounter   Received notification from CoverMyMeds that prior authorization for Jardiance  10mg  is required/requested.   Insurance verification completed.   The patient is insured through [facility] .   Per test claim: PA required; PA submitted to above mentioned insurance via CoverMyMeds Key/confirmation #/EOC [id number] Status is pending

## 2023-07-28 NOTE — Telephone Encounter (Signed)
 Spoke with Melvin Ray social worker. Patient has been rescheduled to 7/8 at 7:30am. He advised me to send instructions to him at [phone]

## 2023-08-03 NOTE — Telephone Encounter (Signed)
 Pharmacy Patient Advocate Encounter  Received notification from [facility] that Prior Authorization for Jardiance  10mg  has been APPROVED from 07/14/2023 to 07/27/2024   PA #/Case ID/Reference #: [id number]

## 2023-08-04 NOTE — Telephone Encounter (Signed)
 Tried to contact pt, call would not go through.

## 2023-08-07 ENCOUNTER — Other Ambulatory Visit: Payer: Self-pay | Admitting: Cardiovascular Disease

## 2023-08-24 ENCOUNTER — Telehealth: Payer: Self-pay | Admitting: Pharmacy Technician

## 2023-08-24 NOTE — Telephone Encounter (Signed)
 Pharmacy Patient Advocate Encounter  Received notification from [facility] that Prior Authorization for ozempic  has been APPROVED from 08/10/23 to 08/23/24    This is a renewal. He just got 08/07/23

## 2023-08-24 NOTE — Telephone Encounter (Signed)
 Pharmacy Patient Advocate Encounter   Received notification from [employer] that prior authorization for Ozempic  (2 MG/DOSE) 8MG /3ML pen-injectors is required/requested.   Insurance verification completed.   The patient is insured through [facility] .   Per test claim: PA required; PA submitted to above mentioned insurance via CoverMyMeds Key/confirmation #/EOC [id number] Status is pending

## 2023-08-26 NOTE — Patient Instructions (Signed)
 Melvin Ray  08/26/2023     @PREFPERIOPPHARMACY @   Your procedure is scheduled on  09/01/2023.   Report to [facility] at  0600 A.M.   Call this number if you have problems the morning of surgery:  [phone]  If you experience any cold or flu symptoms such as cough, fever, chills, shortness of breath, etc. between now and your scheduled surgery, please notify us  at the above number.   Remember:         Your last dose of semaglutide  should be on 08/24/2023.        Your last dose of jardiance  should be on 08/28/2023.        DO NOT take any medications for diabetes the morning of your procedure.        Use your inhaler before you come and bring your rescue inhaler with you.    Follow the diet instructions given to you by the office.   You may drink clear liquids until 0330 am on 09/01/2023.    Clear liquids allowed are:                    Water , Juice (No red color; non-citric and without pulp; diabetics please choose diet or no sugar options), Carbonated beverages (diabetics please choose diet or no sugar options), Clear Tea (No creamer, milk, or cream, including half & half and powdered creamer), Black Coffee Only (No creamer, milk or cream, including half & half and powdered creamer), and Clear Sports drink (No red color; diabetics please choose diet or no sugar options)    Take these medicines the morning of surgery with A SIP OF WATER      amlodipine , clonidine, diltiazem, fluoxetine, metoprolol, pantoprazole.    Do not wear jewelry, make-up or nail polish, including gel polish,  artificial nails, or any other type of covering on natural nails (fingers and  toes).  Do not wear lotions, powders, or perfumes, or deodorant.  Do not shave 48 hours prior to surgery.  Men may shave face and neck.  Do not bring valuables to the hospital.  [facility] is not responsible for any belongings or valuables.  Contacts, dentures or bridgework may not be worn into surgery.  Leave your  suitcase in the car.  After surgery it may be brought to your room.  For patients admitted to the hospital, discharge time will be determined by your treatment team.  Patients discharged the day of surgery will not be allowed to drive home and must have someone with them for 24 hours.    Special instructions:   DO NOT smoke tobacco or vape for 24 hours before your procedure.  Please read over the following fact sheets that you were given. Anesthesia Post-op Instructions and Care and Recovery After Surgery      Upper Endoscopy, Adult, Care After After the procedure, it is common to have a sore throat. It is also common to have: Mild stomach pain or discomfort. Bloating. Nausea. Follow these instructions at home: The instructions below may help you care for yourself at home. Your health care provider may give you more instructions. If you have questions, ask your health care provider. If you were given a sedative during the procedure, it can affect you for several hours. Do not drive or operate machinery until your health care provider says that it is safe. If you will be going home right after the procedure, plan to have a responsible  adult: Take you home from the hospital or clinic. You will not be allowed to drive. Care for you for the time you are told. Follow instructions from your health care provider about what you may eat and drink. Return to your normal activities as told by your health care provider. Ask your health care provider what activities are safe for you. Take over-the-counter and prescription medicines only as told by your health care provider. Contact a health care provider if you: Have a sore throat that lasts longer than one day. Have trouble swallowing. Have a fever. Get help right away if you: Vomit blood or your vomit looks like coffee grounds. Have bloody, black, or tarry stools. Have a very bad sore throat or you cannot swallow. Have difficulty breathing  or very bad pain in your chest or abdomen. These symptoms may be an emergency. Get help right away. Call 911. Do not wait to see if the symptoms will go away. Do not drive yourself to the hospital. Summary After the procedure, it is common to have a sore throat, mild stomach discomfort, bloating, and nausea. If you were given a sedative during the procedure, it can affect you for several hours. Do not drive until your health care provider says that it is safe. Follow instructions from your health care provider about what you may eat and drink. Return to your normal activities as told by your health care provider. This information is not intended to replace advice given to you by your health care provider. Make sure you discuss any questions you have with your health care provider. Document Revised: 05/22/2021 Document Reviewed: 05/22/2021 Elsevier Patient Education  2024 Elsevier Inc.General Anesthesia, Adult, Care After The following information offers guidance on how to care for yourself after your procedure. Your health care provider may also give you more specific instructions. If you have problems or questions, contact your health care provider. What can I expect after the procedure? After the procedure, it is common for people to: Have pain or discomfort at the IV site. Have nausea or vomiting. Have a sore throat or hoarseness. Have trouble concentrating. Feel cold or chills. Feel weak, sleepy, or tired (fatigue). Have soreness and body aches. These can affect parts of the body that were not involved in surgery. Follow these instructions at home: For the time period you were told by your health care provider:  Rest. Do not participate in activities where you could fall or become injured. Do not drive or use machinery. Do not drink alcohol. Do not take sleeping pills or medicines that cause drowsiness. Do not make important decisions or sign legal documents. Do not take care of  children on your own. General instructions Drink enough fluid to keep your urine pale yellow. If you have sleep apnea, surgery and certain medicines can increase your risk for breathing problems. Follow instructions from your health care provider about wearing your sleep device: Anytime you are sleeping, including during daytime naps. While taking prescription pain medicines, sleeping medicines, or medicines that make you drowsy. Return to your normal activities as told by your health care provider. Ask your health care provider what activities are safe for you. Take over-the-counter and prescription medicines only as told by your health care provider. Do not use any products that contain nicotine or tobacco. These products include cigarettes, chewing tobacco, and vaping devices, such as e-cigarettes. These can delay incision healing after surgery. If you need help quitting, ask your health care provider. Contact a health  care provider if: You have nausea or vomiting that does not get better with medicine. You vomit every time you eat or drink. You have pain that does not get better with medicine. You cannot urinate or have bloody urine. You develop a skin rash. You have a fever. Get help right away if: You have trouble breathing. You have chest pain. You vomit blood. These symptoms may be an emergency. Get help right away. Call 911. Do not wait to see if the symptoms will go away. Do not drive yourself to the hospital. Summary After the procedure, it is common to have a sore throat, hoarseness, nausea, vomiting, or to feel weak, sleepy, or fatigue. For the time period you were told by your health care provider, do not drive or use machinery. Get help right away if you have difficulty breathing, have chest pain, or vomit blood. These symptoms may be an emergency. This information is not intended to replace advice given to you by your health care provider. Make sure you discuss any  questions you have with your health care provider. Document Revised: 05/10/2021 Document Reviewed: 05/10/2021 Elsevier Patient Education  2024 [employer].

## 2023-08-27 ENCOUNTER — Other Ambulatory Visit (HOSPITAL_COMMUNITY)

## 2023-08-31 ENCOUNTER — Encounter (HOSPITAL_COMMUNITY)
Admission: RE | Admit: 2023-08-31 | Discharge: 2023-08-31 | Disposition: A | Discharge status: Home / Self Care | Source: Ambulatory Visit | Attending: Gastroenterology | Admitting: Gastroenterology

## 2023-08-31 ENCOUNTER — Encounter: Payer: Self-pay | Admitting: *Deleted

## 2023-08-31 ENCOUNTER — Telehealth: Payer: Self-pay | Admitting: *Deleted

## 2023-08-31 DIAGNOSIS — E1122 Type 2 diabetes mellitus with diabetic chronic kidney disease: Secondary | ICD-10-CM

## 2023-08-31 DIAGNOSIS — I1A0 Resistant hypertension: Secondary | ICD-10-CM

## 2023-08-31 DIAGNOSIS — Z794 Long term (current) use of insulin: Secondary | ICD-10-CM

## 2023-08-31 NOTE — Telephone Encounter (Signed)
 cancellation Received: Today Levora Randine SQUIBB, RN  Jeanell Graeme RAMAN, CMA; Young, Elveria LOUVENIA Ponto, Kimmi Acocella L, LPN Good Morning! Melvin Ray called to let us  know that he took his Ozemic yesterday.  We will need to cancel and reschedule. Thank you!

## 2023-08-31 NOTE — Telephone Encounter (Signed)
 Pt has been rescheduled for 09/15/23. Updated instructions mailed.

## 2023-08-31 NOTE — Progress Notes (Signed)
 Patient didn't stop his Ozempic  so he will need to be rescheduled.

## 2023-08-31 NOTE — Telephone Encounter (Signed)
 Thanks for the update

## 2023-09-09 ENCOUNTER — Ambulatory Visit: Admitting: Podiatry

## 2023-09-09 NOTE — Patient Instructions (Signed)
 Draven Natter  09/09/2023     @PREFPERIOPPHARMACY @   Your procedure is scheduled on  09/15/2023.   Report to [facility] at  0900  A.M.   Call this number if you have problems the morning of surgery:  [phone]  If you experience any cold or flu symptoms such as cough, fever, chills, shortness of breath, etc. between now and your scheduled surgery, please notify us  at the above number.   Remember:        Your last dose of semaglutide  should have been on 09/07/2023.        Your last dose of jardiance  should be on 09/11/2023.        Use your inhaler before you come and bring your rescue inahler with you.         DO NOT take any medications for diabetes the morning of your procedure.    Follow the diet instructions given to you by the office.   You may drink clear liquids until  0700 am on 09/15/2023.    Clear liquids allowed are:                    Water , Juice (No red color; non-citric and without pulp; diabetics please choose diet or no sugar options), Carbonated beverages (diabetics please choose diet or no sugar options), Clear Tea (No creamer, milk, or cream, including half & half and powdered creamer), Black Coffee Only (No creamer, milk or cream, including half & half and powdered creamer), and Clear Sports drink (No red color; diabetics please choose diet or no sugar options)    Take these medicines the morning of surgery with A SIP OF WATER           amlodipine , clonidine, diltiazem, fluoxetine, metoprolol.     Do not wear jewelry, make-up or nail polish, including gel polish,  artificial nails, or any other type of covering on natural nails (fingers and  toes).  Do not wear lotions, powders, or perfumes, or deodorant.  Do not shave 48 hours prior to surgery.  Men may shave face and neck.  Do not bring valuables to the hospital.  [facility] is not responsible for any belongings or valuables.  Contacts, dentures or bridgework may not be worn into surgery.   Leave your suitcase in the car.  After surgery it may be brought to your room.  For patients admitted to the hospital, discharge time will be determined by your treatment team.  Patients discharged the day of surgery will not be allowed to drive home and must have someone with them for 24 hours.    Special instructions:   DO NOT smoke tobacco or vape for 24 hours before your procedure.  Please read over the following fact sheets that you were given. Anesthesia Post-op Instructions and Care and Recovery After Surgery      Upper Endoscopy, Adult, Care After After the procedure, it is common to have a sore throat. It is also common to have: Mild stomach pain or discomfort. Bloating. Nausea. Follow these instructions at home: The instructions below may help you care for yourself at home. Your health care provider may give you more instructions. If you have questions, ask your health care provider. If you were given a sedative during the procedure, it can affect you for several hours. Do not drive or operate machinery until your health care provider says that it is safe. If you will be going home right  after the procedure, plan to have a responsible adult: Take you home from the hospital or clinic. You will not be allowed to drive. Care for you for the time you are told. Follow instructions from your health care provider about what you may eat and drink. Return to your normal activities as told by your health care provider. Ask your health care provider what activities are safe for you. Take over-the-counter and prescription medicines only as told by your health care provider. Contact a health care provider if you: Have a sore throat that lasts longer than one day. Have trouble swallowing. Have a fever. Get help right away if you: Vomit blood or your vomit looks like coffee grounds. Have bloody, black, or tarry stools. Have a very bad sore throat or you cannot swallow. Have difficulty  breathing or very bad pain in your chest or abdomen. These symptoms may be an emergency. Get help right away. Call 911. Do not wait to see if the symptoms will go away. Do not drive yourself to the hospital. Summary After the procedure, it is common to have a sore throat, mild stomach discomfort, bloating, and nausea. If you were given a sedative during the procedure, it can affect you for several hours. Do not drive until your health care provider says that it is safe. Follow instructions from your health care provider about what you may eat and drink. Return to your normal activities as told by your health care provider. This information is not intended to replace advice given to you by your health care provider. Make sure you discuss any questions you have with your health care provider. Document Revised: 05/22/2021 Document Reviewed: 05/22/2021 Elsevier Patient Education  2024 [employer].General Anesthesia, Adult, Care After The following information offers guidance on how to care for yourself after your procedure. Your health care provider may also give you more specific instructions. If you have problems or questions, contact your health care provider. What can I expect after the procedure? After the procedure, it is common for people to: Have pain or discomfort at the IV site. Have nausea or vomiting. Have a sore throat or hoarseness. Have trouble concentrating. Feel cold or chills. Feel weak, sleepy, or tired (fatigue). Have soreness and body aches. These can affect parts of the body that were not involved in surgery. Follow these instructions at home: For the time period you were told by your health care provider:  Rest. Do not participate in activities where you could fall or become injured. Do not drive or use machinery. Do not drink alcohol. Do not take sleeping pills or medicines that cause drowsiness. Do not make important decisions or sign legal documents. Do not take  care of children on your own. General instructions Drink enough fluid to keep your urine pale yellow. If you have sleep apnea, surgery and certain medicines can increase your risk for breathing problems. Follow instructions from your health care provider about wearing your sleep device: Anytime you are sleeping, including during daytime naps. While taking prescription pain medicines, sleeping medicines, or medicines that make you drowsy. Return to your normal activities as told by your health care provider. Ask your health care provider what activities are safe for you. Take over-the-counter and prescription medicines only as told by your health care provider. Do not use any products that contain nicotine or tobacco. These products include cigarettes, chewing tobacco, and vaping devices, such as e-cigarettes. These can delay incision healing after surgery. If you need help quitting,  ask your health care provider. Contact a health care provider if: You have nausea or vomiting that does not get better with medicine. You vomit every time you eat or drink. You have pain that does not get better with medicine. You cannot urinate or have bloody urine. You develop a skin rash. You have a fever. Get help right away if: You have trouble breathing. You have chest pain. You vomit blood. These symptoms may be an emergency. Get help right away. Call 911. Do not wait to see if the symptoms will go away. Do not drive yourself to the hospital. Summary After the procedure, it is common to have a sore throat, hoarseness, nausea, vomiting, or to feel weak, sleepy, or fatigue. For the time period you were told by your health care provider, do not drive or use machinery. Get help right away if you have difficulty breathing, have chest pain, or vomit blood. These symptoms may be an emergency. This information is not intended to replace advice given to you by your health care provider. Make sure you discuss any  questions you have with your health care provider. Document Revised: 05/10/2021 Document Reviewed: 05/10/2021 Elsevier Patient Education  2024 [employer].

## 2023-09-10 ENCOUNTER — Other Ambulatory Visit (HOSPITAL_COMMUNITY)
Admission: RE | Admit: 2023-09-10 | Discharge: 2023-09-10 | Disposition: A | Discharge status: Home / Self Care | Source: Ambulatory Visit | Attending: Nephrology | Admitting: Nephrology

## 2023-09-10 ENCOUNTER — Encounter (HOSPITAL_COMMUNITY)
Admission: RE | Admit: 2023-09-10 | Discharge: 2023-09-10 | Disposition: A | Discharge status: Home / Self Care | Source: Ambulatory Visit | Attending: Gastroenterology | Admitting: Gastroenterology

## 2023-09-10 ENCOUNTER — Ambulatory Visit (INDEPENDENT_AMBULATORY_CARE_PROVIDER_SITE_OTHER): Payer: Self-pay | Admitting: Gastroenterology

## 2023-09-10 ENCOUNTER — Encounter (HOSPITAL_COMMUNITY): Payer: Self-pay

## 2023-09-10 ENCOUNTER — Other Ambulatory Visit (HOSPITAL_COMMUNITY)
Admission: RE | Admit: 2023-09-10 | Discharge: 2023-09-10 | Disposition: A | Discharge status: Home / Self Care | Source: Ambulatory Visit | Attending: Gastroenterology | Admitting: Gastroenterology

## 2023-09-10 VITALS — BP 149/86 | HR 79 | Resp 18 | Ht 72.0 in | Wt 399.7 lb

## 2023-09-10 DIAGNOSIS — Z794 Long term (current) use of insulin: Secondary | ICD-10-CM | POA: Diagnosis not present

## 2023-09-10 DIAGNOSIS — Z01812 Encounter for preprocedural laboratory examination: Secondary | ICD-10-CM | POA: Diagnosis present

## 2023-09-10 DIAGNOSIS — E1122 Type 2 diabetes mellitus with diabetic chronic kidney disease: Secondary | ICD-10-CM | POA: Insufficient documentation

## 2023-09-10 DIAGNOSIS — N182 Chronic kidney disease, stage 2 (mild): Secondary | ICD-10-CM | POA: Insufficient documentation

## 2023-09-10 DIAGNOSIS — D649 Anemia, unspecified: Secondary | ICD-10-CM | POA: Insufficient documentation

## 2023-09-10 LAB — CBC WITH DIFFERENTIAL/PLATELET
Abs Immature Granulocytes: 0.01 K/uL (ref 0.00–0.07)
Basophils Absolute: 0.1 K/uL (ref 0.0–0.1)
Basophils Relative: 1 %
Eosinophils Absolute: 0.2 K/uL (ref 0.0–0.5)
Eosinophils Relative: 2 %
HCT: 40.9 % (ref 39.0–52.0)
Hemoglobin: 13.3 g/dL (ref 13.0–17.0)
Immature Granulocytes: 0 %
Lymphocytes Relative: 39 %
Lymphs Abs: 3.6 K/uL (ref 0.7–4.0)
MCH: 27.8 pg (ref 26.0–34.0)
MCHC: 32.5 g/dL (ref 30.0–36.0)
MCV: 85.4 fL (ref 80.0–100.0)
Monocytes Absolute: 0.7 K/uL (ref 0.1–1.0)
Monocytes Relative: 7 %
Neutro Abs: 4.8 K/uL (ref 1.7–7.7)
Neutrophils Relative %: 51 %
Platelets: 211 K/uL (ref 150–400)
RBC: 4.79 MIL/uL (ref 4.22–5.81)
RDW: 13.8 % (ref 11.5–15.5)
WBC: 9.3 K/uL (ref 4.0–10.5)
nRBC: 0 % (ref 0.0–0.2)

## 2023-09-10 LAB — BASIC METABOLIC PANEL WITH GFR
Anion gap: 16 — ABNORMAL HIGH (ref 5–15)
BUN: 18 mg/dL (ref 6–20)
CO2: 25 mmol/L (ref 22–32)
Calcium: 9.7 mg/dL (ref 8.9–10.3)
Chloride: 93 mmol/L — ABNORMAL LOW (ref 98–111)
Creatinine, Ser: 1.31 mg/dL — ABNORMAL HIGH (ref 0.61–1.24)
GFR, Estimated: >60 mL/min (ref >60)
Glucose, Bld: 299 mg/dL — ABNORMAL HIGH (ref 70–99)
Potassium: 3.1 mmol/L — ABNORMAL LOW (ref 3.5–5.1)
Sodium: 134 mmol/L — ABNORMAL LOW (ref 135–145)

## 2023-09-10 LAB — RENAL FUNCTION PANEL
Albumin: 3.7 g/dL (ref 3.5–5.0)
Anion gap: 18 — ABNORMAL HIGH (ref 5–15)
BUN: 18 mg/dL (ref 6–20)
CO2: 24 mmol/L (ref 22–32)
Calcium: 9.7 mg/dL (ref 8.9–10.3)
Chloride: 92 mmol/L — ABNORMAL LOW (ref 98–111)
Creatinine, Ser: 1.31 mg/dL — ABNORMAL HIGH (ref 0.61–1.24)
GFR, Estimated: >60 mL/min
Glucose, Bld: 301 mg/dL — ABNORMAL HIGH (ref 70–99)
Phosphorus: 3.5 mg/dL (ref 2.5–4.6)
Potassium: 3.1 mmol/L — ABNORMAL LOW (ref 3.5–5.1)
Sodium: 134 mmol/L — ABNORMAL LOW (ref 135–145)

## 2023-09-10 LAB — PROTEIN / CREATININE RATIO, URINE
Creatinine, Urine: 357 mg/dL
Protein Creatinine Ratio: 0.1 mg/mg{creat} (ref 0.00–0.15)
Total Protein, Urine: 36 mg/dL

## 2023-09-11 ENCOUNTER — Emergency Department (HOSPITAL_COMMUNITY)
Admission: EM | Admit: 2023-09-11 | Discharge: 2023-09-11 | Disposition: A | Discharge status: Home / Self Care | Attending: Emergency Medicine | Admitting: Emergency Medicine

## 2023-09-11 ENCOUNTER — Other Ambulatory Visit: Payer: Self-pay

## 2023-09-11 ENCOUNTER — Encounter (HOSPITAL_COMMUNITY): Payer: Self-pay | Admitting: *Deleted

## 2023-09-11 DIAGNOSIS — E1165 Type 2 diabetes mellitus with hyperglycemia: Secondary | ICD-10-CM | POA: Diagnosis not present

## 2023-09-11 DIAGNOSIS — R799 Abnormal finding of blood chemistry, unspecified: Secondary | ICD-10-CM | POA: Diagnosis present

## 2023-09-11 DIAGNOSIS — R000 Tachycardia, unspecified: Secondary | ICD-10-CM | POA: Diagnosis not present

## 2023-09-11 DIAGNOSIS — Z7984 Long term (current) use of oral hypoglycemic drugs: Secondary | ICD-10-CM | POA: Insufficient documentation

## 2023-09-11 DIAGNOSIS — E876 Hypokalemia: Secondary | ICD-10-CM | POA: Insufficient documentation

## 2023-09-11 DIAGNOSIS — R739 Hyperglycemia, unspecified: Secondary | ICD-10-CM

## 2023-09-11 HISTORY — DX: Disorder of kidney and ureter, unspecified: N28.9

## 2023-09-11 LAB — BASIC METABOLIC PANEL WITH GFR
Anion gap: 11 (ref 5–15)
BUN: 19 mg/dL (ref 6–20)
CO2: 25 mmol/L (ref 22–32)
Calcium: 8.4 mg/dL — ABNORMAL LOW (ref 8.9–10.3)
Chloride: 97 mmol/L — ABNORMAL LOW (ref 98–111)
Creatinine, Ser: 1.39 mg/dL — ABNORMAL HIGH (ref 0.61–1.24)
GFR, Estimated: >60 mL/min (ref >60)
Glucose, Bld: 291 mg/dL — ABNORMAL HIGH (ref 70–99)
Potassium: 2.9 mmol/L — ABNORMAL LOW (ref 3.5–5.1)
Sodium: 133 mmol/L — ABNORMAL LOW (ref 135–145)

## 2023-09-11 LAB — MAGNESIUM: Magnesium: 1.5 mg/dL — ABNORMAL LOW (ref 1.7–2.4)

## 2023-09-11 LAB — CBG MONITORING, ED
Glucose-Capillary: 374 mg/dL — ABNORMAL HIGH (ref 70–99)
Glucose-Capillary: 325 mg/dL — ABNORMAL HIGH (ref 70–99)
Glucose-Capillary: 319 mg/dL — ABNORMAL HIGH (ref 70–99)

## 2023-09-11 MED ORDER — SODIUM CHLORIDE 0.9 % IV BOLUS
1000.0000 mL | Freq: Once | INTRAVENOUS | Status: AC
  Administered 2023-09-11: 1000 mL via INTRAVENOUS

## 2023-09-11 MED ORDER — MAGNESIUM SULFATE 2 GM/50ML IV SOLN
2.0000 g | Freq: Once | INTRAVENOUS | Status: AC
  Administered 2023-09-11: 2 g via INTRAVENOUS
  Filled 2023-09-11: qty 50

## 2023-09-11 MED ORDER — POTASSIUM CHLORIDE CRYS ER 20 MEQ PO TBCR
40.0000 meq | EXTENDED_RELEASE_TABLET | Freq: Once | ORAL | Status: AC
  Administered 2023-09-11: 40 meq via ORAL
  Filled 2023-09-11: qty 2

## 2023-09-11 MED ORDER — INSULIN ASPART 100 UNIT/ML IJ SOLN
10.0000 [IU] | Freq: Once | INTRAMUSCULAR | Status: AC
  Administered 2023-09-11: 10 [IU] via SUBCUTANEOUS
  Filled 2023-09-11: qty 1

## 2023-09-11 NOTE — ED Provider Notes (Signed)
 Care transferred to me.  Patient feels fine.  BMP does not show evidence of DKA.  He does have hypokalemia which is apparently Acute on chronic.  ECG without QTc prolongation.  Will have him increase his potassium supplementation at home, and have him follow-up with PCP for recheck.     EKG Interpretation Date/Time:  Friday September 11 2023 16:30:18 EDT Ventricular Rate:  70 PR Interval:  156 QRS Duration:  108 QT Interval:  393 QTC Calculation: 424 R Axis:   -31  Text Interpretation: Sinus rhythm Left axis deviation Nonspecific T abnormalities, inferior leads no significant change since 2023 Confirmed by Freddi Hamilton [phone]) on 09/11/2023 4:33:22 PM          Freddi Hamilton, MD 09/11/23 1636

## 2023-09-11 NOTE — Discharge Instructions (Addendum)
 It was our pleasure to provide your ER care today - we hope that you feel better.  Drink plenty of water /fluids/stay well hydrated.  Make sure to take your diabetes meds as prescribed, and follow diabetes meal plan.   Your potassium level and magnesium levels are low - eat plenty of fruits and vegetables,  and follow up with your doctor in one week for recheck.  Otherwise, increase your potassium chloride  from 10 mEq to 20 mill equivalents per day.  Check your glucose 4x/day (before meals and at night) and record values. Follow up closely with primary care doctor in the next 2-3 days,  bring copy of your blood glucose levels to the appointment, and discuss possible adjustment of medications.   Return to ER if worse, new symptoms, fevers, new or severe pain, abdominal pain, vomiting, weak/fainting, or other concern.

## 2023-09-11 NOTE — Pre-Procedure Instructions (Signed)
 I called case Worker, Melvin Ray, who states he spoke with patient yesterday and told him of the conversation with Dr Eartha and pt was supposed to try and find someone to take him to ED yesterday. Melvin states he also gave patient the non-emergency transport number. I told Melvin Ray there was no ED note showing patient had gone to ED. He is to contact patient this morning and tell him he needs to go. He is going to keep us  updated. Dr Eartha also messaged.   This note was placed 7/18 @ 0808 am.

## 2023-09-11 NOTE — ED Provider Notes (Signed)
 [place] EMERGENCY DEPARTMENT AT [facility] Provider Note   CSN: [id number] Arrival date & time: 09/11/23  8888     Patient presents with: Abnormal Lab   Melvin Ray is a 38 y.o. male.   Pt indicates had recent outpatient labs in preparation for a planned endoscopy, and indicates was called and told to go to ER as high level was high ?glucose. Hx diabetes. Indicates takes metformin  but is not taking jardiance . Denies feeling sick or ill. Is eating/drinking. No nv. Denies polyuria or polydipsia.   The history is provided by the patient, medical records and the EMS personnel.  Abnormal Lab      Prior to Admission medications   Medication Sig Start Date End Date Taking? Authorizing Provider  ACCU-CHEK GUIDE test strip 3 (three) times daily. 11/03/19   [provider]  Accu-Chek Softclix Lancets lancets 3 (three) times daily. 10/17/19   [provider]  albuterol (VENTOLIN HFA) 108 (90 Base) MCG/ACT inhaler Inhale 1-2 puffs into the lungs every 6 (six) hours as needed for wheezing or shortness of breath.    [provider]  amLODipine  (NORVASC ) 2.5 MG tablet Take 1 tablet (2.5 mg total) by mouth daily. Patient taking differently: Take 5 mg by mouth daily. 04/24/22   Raford Riggs, MD  chlorthalidone  (HYGROTON ) 25 MG tablet TAKE 1 TABLET ONCE DAILY. 05/13/22   Raford Riggs, MD  Cholecalciferol  (VITAMIN D3) 50 MCG (2000 UT) TABS Take 2,000 Units by mouth daily.    [provider]  cloNIDine (CATAPRES) 0.3 MG tablet Take 0.3 mg by mouth 2 (two) times daily. 08/09/21   [provider]  diltiazem (CARDIZEM CD) 300 MG 24 hr capsule Take 300 mg by mouth daily.    [provider]  empagliflozin  (JARDIANCE ) 10 MG TABS tablet Take 1 tablet (10 mg total) by mouth daily before breakfast. 07/15/23   [name], [name], MD  FEROSUL 325 (65 Fe) MG tablet Take 325 mg by mouth daily. 11/19/21   [provider]  FLUoxetine  (PROZAC) 20 MG capsule Take 20 mg by mouth daily. 10/22/21   [provider]  Fluticasone-Salmeterol (ADVAIR) 250-50 MCG/DOSE AEPB Inhale 1 puff into the lungs daily.    [provider]  hydrALAZINE  (APRESOLINE ) 100 MG tablet Take 100 mg by mouth 2 (two) times daily. 10/22/21   [provider]  losartan (COZAAR) 100 MG tablet Take 100 mg by mouth at bedtime.    [provider]  metFORMIN  (GLUCOPHAGE -XR) 500 MG 24 hr tablet Take 1 tablet (500 mg total) by mouth daily with breakfast. 07/15/23   [name], [name], MD  metoprolol (TOPROL-XL) 200 MG 24 hr tablet Take 200 mg by mouth daily. 10/13/19   [provider]  NON FORMULARY Pt uses a cpap nightly    [provider]  pantoprazole (PROTONIX) 40 MG tablet Take 40 mg by mouth daily. 10/13/19   [provider]  potassium chloride  (KLOR-CON ) 10 MEQ tablet Take 10 mEq by mouth daily. 10/22/21   [provider]  Semaglutide , 2 MG/DOSE, (OZEMPIC , 2 MG/DOSE,) 8 MG/3ML SOPN Inject 2 mg into the skin once a week. Needs appointment for refills 08/07/23   Raford Riggs, MD  terbinafine  (LAMISIL ) 250 MG tablet Take 1 tablet (250 mg total) by mouth daily. 07/09/23   [name], [name]  traZODone (DESYREL) 100 MG tablet Take 200 mg by mouth at bedtime.     [provider]    Allergies: Patient has no  known allergies.    Review of Systems  Constitutional:  Negative for chills and fever.  HENT:  Negative for sore throat.   Eyes:  Negative for visual disturbance.  Respiratory:  Negative for shortness of breath.   Cardiovascular:  Negative for chest pain.  Gastrointestinal:  Negative for abdominal pain, diarrhea and vomiting.  Endocrine: Negative for polydipsia and polyuria.  Genitourinary:  Negative for dysuria.  Musculoskeletal:  Negative for back pain and neck pain.  Neurological:  Negative for weakness, numbness and headaches.    Updated Vital Signs BP 109/71 (BP  Location: Right Arm)   Pulse (!) 105   Temp (P) 98.1 F (36.7 C)   Resp 18   Ht 1.829 m (6')   Wt (!) 164.7 kg   SpO2 99%   BMI 49.23 kg/m   Physical Exam Vitals and nursing note reviewed.  Constitutional:      Appearance: Normal appearance. He is well-developed.  HENT:     Head: Atraumatic.     Nose: Nose normal.     Mouth/Throat:     Pharynx: Oropharynx is clear.     Comments: Dry MM Eyes:     General: No scleral icterus.    Conjunctiva/sclera: Conjunctivae normal.  Neck:     Trachea: No tracheal deviation.     Comments: Trachea midline, thyroid not grossly enlarged or tender. No neck stiffness or rigidity. Cardiovascular:     Rate and Rhythm: Regular rhythm. Tachycardia present.     Pulses: Normal pulses.     Heart sounds: Normal heart sounds. No murmur heard.    No friction rub. No gallop.  Pulmonary:     Effort: Pulmonary effort is normal. No accessory muscle usage or respiratory distress.     Breath sounds: Normal breath sounds.  Abdominal:     General: Bowel sounds are normal. There is no distension.     Palpations: Abdomen is soft.     Tenderness: There is no abdominal tenderness. There is no guarding.  Genitourinary:    Comments: No cva tenderness. Musculoskeletal:        General: No swelling or tenderness.     Cervical back: Normal range of motion and neck supple. No rigidity.     Right lower leg: No edema.     Left lower leg: No edema.  Skin:    General: Skin is warm and dry.     Findings: No rash.  Neurological:     Mental Status: He is alert.     Comments: Alert, speech clear. Motor/sens grossly intact bil.   Psychiatric:        Mood and Affect: Mood normal.     (all labs ordered are listed, but only abnormal results are displayed) Results for orders placed or performed during the hospital encounter of 09/11/23  Magnesium   Collection Time: 09/11/23 12:21 PM  Result Value Ref Range   Magnesium 1.5 (L) 1.7 - 2.4 mg/dL  CBG monitoring, ED    Collection Time: 09/11/23 12:26 PM  Result Value Ref Range   Glucose-Capillary 374 (H) 70 - 99 mg/dL  POC CBG, ED   Collection Time: 09/11/23  2:20 PM  Result Value Ref Range   Glucose-Capillary 325 (H) 70 - 99 mg/dL      EKG: None  Radiology: No results found.   Procedures   Medications Ordered in the ED  sodium chloride 0.9 % bolus 1,000 mL (0 mLs Intravenous Stopped 09/11/23 1417)  insulin aspart  (novoLOG ) injection 10 Units (10  Units Subcutaneous Given 09/11/23 1233)  potassium chloride  SA (KLOR-CON  M) CR tablet 40 mEq (40 mEq Oral Given 09/11/23 1231)  magnesium sulfate IVPB 2 g 50 mL (2 g Intravenous New Bag/Given 09/11/23 1416)  sodium chloride 0.9 % bolus 1,000 mL (1,000 mLs Intravenous New Bag/Given 09/11/23 1416)                                    Medical Decision Making Problems Addressed: Hyperglycemia: acute illness or injury with systemic symptoms that poses a threat to life or bodily functions Hypokalemia: acute illness or injury Hypomagnesemia: acute illness or injury  Amount and/or Complexity of Data Reviewed Independent Historian: EMS    Details: hx External Data Reviewed: labs and notes. Labs: ordered. Decision-making details documented in ED Course.  Risk Prescription drug management. Decision regarding hospitalization.   Iv ns. Continuous pulse ox and cardiac monitoring. Labs ordered/sent.  Differential diagnosis includes  . Dispo decision including potential need for admission considered - will get labs and reassess.   Reviewed nursing notes and prior charts for additional history. External reports reviewed. Additional history from: EMS.   From recent labs, glucose was high, hco3 normal. Ns bolus. Novolog  sq.   Cardiac monitor: sinus rhythm, rate 102.  Labs reviewed/interpreted by me - from yesterdays labs, glucose high, hco3 normal. Hyperglycemia/volume depletion. Ivf. Po fluids.  No nv. K was low. Kcl po.  Mg iv.  Today's repeat bmet is  pending.   1535, bmet, repeat cbgs pending - signed out to Dr Freddi to check pending chemistries, recheck pt, and dispo apppropriately (if hco3 low/dka, will require admission).         Final diagnoses:  Hyperglycemia    ED Discharge Orders     None          Bernard Drivers, MD 09/11/23 1538

## 2023-09-11 NOTE — ED Triage Notes (Signed)
 Pt BIB [facility] for elevated potassium level.  Pt denies any CP or SOB. Pt states hx of kidney disease.

## 2023-09-11 NOTE — Pre-Procedure Instructions (Signed)
  RE: no ED visit Received: Today [name], MD  Rhenda Luke GAILS, RN; Gaylene Madelin CROME, LPN; Jeanell Graeme RAMAN, CMA Thanks Luke. If he were to come to his procedure on Tuesday, we will need to check a BMP STAT once he comes here to check if his glucose and anion gap has improved. Thanks       Previous Messages    ----- Message ----- From: Rhenda Luke GAILS, RN Sent: 09/11/2023   8:10 AM EDT To: Madelin CROME Gaylene, LPN; Graeme RAMAN Jeanell, CMA* Subject: no ED visit                                         Dr Eartha, I just wanted to keep you in the loop about what has been done thus far.   Rhenda Luke GAILS, RN Registered Nurse Surgery   Pre-Procedure Instructions   Signed   Date of Service: 09/10/2023  2:13 PM  Signed  I called case Worker, Garen Hoard, who states he spoke with patient yesterday and told him of the conversation with Dr Eartha and pt was supposed to try and find someone to take him to ED yesterday. Garen states he also gave patient the non-emergency transport number. I told Johnny there was no ED note showing patient had gone to ED. He is to contact patient this morning and tell him he needs to go. He is going to keep us  updated. Dr Eartha also messaged.   This note was placed 7/18 @ 0808 am.

## 2023-09-11 NOTE — Pre-Procedure Instructions (Signed)
 Message Details Received: Melvin Melvin Ray Toribio, MD  Rhenda Luke GAILS, RN 09/10/2023 - Results: Interface, Lab In [place] and others (Newest Message First)           Melvin Ray, Toribio, MD to Me  (Selected Message)    09/10/23  4:59 PM Result Note I spoke to the patient's H case worker regarding elevated glucose of 299 with open anion gap of 16.  I advised he should go to the ER to have this managed.  If his sugars improve he may have his endoscopy performed on Tuesday. Melvin Ray, please reach the patient tomorrow to have a repeat BMP drawn on Monday so we can follow-up on this.  If he is still hospitalized at that time, we will need to cancel his esophagogastroduodenospy. CBC with Differential/Platelet; Basic metabolic panel View older events  Associated Results   Contains abnormal data Basic metabolic panel Order: [id number]  Status: Final result     Dx: Type 2 diabetes mellitus with stage 2...   Test Result Released: Yes (not seen)   2 Result Notes     View Follow-Up Encounter     1 HM Topic  important suggestion  Newer results are available. Click to view them now.            Component Ref Range & Units (hover) 1 d ago (09/10/23) 2 mo ago (07/08/23) 3 mo ago (06/11/23) 1 yr ago (05/08/22) 1 yr ago (01/02/22) 2 yr ago (07/23/21) 2 yr ago (07/23/21)  Sodium 134 Low  141 R 137 141 R 141  138 R  Potassium 3.1 Low  3.7 R 3.2 Low  4.1 R 3.2 Low   3.5 R  Chloride 93 Low  96 R 95 Low  98 R 103  96 Abnormal  R  CO2 25 30 High  R 27 25 R 28  28 Abnormal  R  Glucose, Bld 299 High  269 High  108 High  CM 177 High  107 High  CM  240 R  Comment: Glucose reference range applies only to samples taken after fasting for at least 8 hours.  BUN 18 12 16 16 16  15  R  Creatinine, Ser 1.31 High  1.31 High  R 1.13 1.17 R 1.11  1.2 R  Calcium 9.7 9.9 R 9.6 9.7 R 9.5 9.5 R   GFR, Estimated >60  >60 CM  >60 CM    Comment: (NOTE) Calculated using the CKD-EPI Creatinine Equation  (2021)  Anion gap 16 High   15 CM  10 CM    Comment: Performed at [facility], [address], [place], [place] [address]  Resulting Agency [facility] OTHER OTHER        Specimen Collected: 09/10/23 14:00 Last Resulted: 09/10/23 14:53      Lab Flowsheet      Order Details      View Encounter      Lab and Collection Details      Routing      Result History    CM=Additional comments  R=Reference range differs from most recent result in table     Add MyChart Message   Add Notifications  Back to Top  Result Care Coordination  Click to expand (Result Note 2)  Result Notes     Encounters Related to Results  09/10/2023 Results Follow-Up 09/10/2023 Hospital Encounter (Ordering Encounter) Back to Top     Satisfied Health Maintenance Topics  Back to Top Diabetic kidney evaluation - eGFR measurement (Yearly)  Next due on 09/09/2024     CBC with Differential/Platelet Order: [id number]  Status: Final result     Dx: Anemia, unspecified type   Test Result Released: Yes (not seen)   2 Result Notes     View Follow-Up Encounter      Component Ref Range & Units (hover) 1 d ago 3 mo ago 1 yr ago  WBC 9.3 8.7 7.8  RBC 4.79 4.66 4.43  Hemoglobin 13.3 12.6 Low  11.7 Low   HCT 40.9 40.5 38.3 Low   MCV 85.4 86.9 86.5  MCH 27.8 27.0 26.4  MCHC 32.5 31.1 30.5  RDW 13.8 14.9 16.0 High   Platelets 211 240 209  Comment: SPECIMEN CHECKED FOR CLOTS PLATELET COUNT CONFIRMED BY SMEAR  nRBC 0.0 0.0 0.0  Neutrophils Relative % 51 55 56  Neutro Abs 4.8 4.8 4.3  Lymphocytes Relative 39 33 33  Lymphs Abs 3.6 2.9 2.6  Monocytes Relative 7 9 9   Monocytes Absolute 0.7 0.8 0.7  Eosinophils Relative 2 2 1   Eosinophils Absolute 0.2 0.2 0.1  Basophils Relative 1 0 1  Basophils Absolute 0.1 0.0 0.1  WBC Morphology MORPHOLOGY UNREMARKABLE    RBC Morphology MORPHOLOGY UNREMARKABLE    Immature Granulocytes 0 1 0  Abs Immature Granulocytes 0.01 0.04 CM  0.03 CM  Reactive, Benign Lymphocytes PRESENT    Comment: Performed at [facility], [address] [place], [place] [address]  Resulting Agency [facility]        Specimen Collected: 09/10/23 14:00 Last Resulted: 09/10/23 15:36      Lab Flowsheet      Order Details      View Encounter      Lab and Collection Details      Routing      Result History    CM=Additional comments     Add MyChart Message   Add Notifications  Back to Top  Result Care Coordination  Click to expand (Result Note 2)  Result Notes     Encounters Related to Results  09/10/2023 Results Follow-Up 09/10/2023 Hospital Encounter (Ordering Encounter) Back to Top   Status of Other Orders  Completed    Pre-admission testing diagnosis  09/10/23    Canceled   Initiate Pre-op Protocol 09/11/23  Reason: Patient Discharge  Initiate Pre-op Protocol 09/11/23  Reason: Patient Discharge

## 2023-09-15 ENCOUNTER — Telehealth: Payer: Self-pay | Admitting: "Endocrinology

## 2023-09-15 ENCOUNTER — Ambulatory Visit (HOSPITAL_COMMUNITY): Payer: Self-pay | Admitting: Anesthesiology

## 2023-09-15 ENCOUNTER — Ambulatory Visit (HOSPITAL_COMMUNITY)
Admission: RE | Admit: 2023-09-15 | Discharge: 2023-09-15 | Disposition: A | Discharge status: Home / Self Care | Attending: Gastroenterology | Admitting: Gastroenterology

## 2023-09-15 ENCOUNTER — Encounter (HOSPITAL_COMMUNITY)
Admission: RE | Disposition: A | Discharge status: Home / Self Care | Payer: Self-pay | Source: Home / Self Care | Attending: Gastroenterology

## 2023-09-15 DIAGNOSIS — I130 Hypertensive heart and chronic kidney disease with heart failure and stage 1 through stage 4 chronic kidney disease, or unspecified chronic kidney disease: Secondary | ICD-10-CM

## 2023-09-15 DIAGNOSIS — N182 Chronic kidney disease, stage 2 (mild): Secondary | ICD-10-CM

## 2023-09-15 DIAGNOSIS — I471 Supraventricular tachycardia, unspecified: Secondary | ICD-10-CM | POA: Insufficient documentation

## 2023-09-15 DIAGNOSIS — Z794 Long term (current) use of insulin: Secondary | ICD-10-CM

## 2023-09-15 DIAGNOSIS — K3189 Other diseases of stomach and duodenum: Secondary | ICD-10-CM

## 2023-09-15 DIAGNOSIS — E1122 Type 2 diabetes mellitus with diabetic chronic kidney disease: Secondary | ICD-10-CM

## 2023-09-15 DIAGNOSIS — N189 Chronic kidney disease, unspecified: Secondary | ICD-10-CM | POA: Insufficient documentation

## 2023-09-15 DIAGNOSIS — K2950 Unspecified chronic gastritis without bleeding: Secondary | ICD-10-CM | POA: Diagnosis not present

## 2023-09-15 DIAGNOSIS — G4733 Obstructive sleep apnea (adult) (pediatric): Secondary | ICD-10-CM | POA: Diagnosis not present

## 2023-09-15 DIAGNOSIS — Z7984 Long term (current) use of oral hypoglycemic drugs: Secondary | ICD-10-CM | POA: Diagnosis not present

## 2023-09-15 DIAGNOSIS — Z7985 Long-term (current) use of injectable non-insulin antidiabetic drugs: Secondary | ICD-10-CM | POA: Diagnosis not present

## 2023-09-15 DIAGNOSIS — I509 Heart failure, unspecified: Secondary | ICD-10-CM | POA: Diagnosis not present

## 2023-09-15 DIAGNOSIS — I129 Hypertensive chronic kidney disease with stage 1 through stage 4 chronic kidney disease, or unspecified chronic kidney disease: Secondary | ICD-10-CM | POA: Insufficient documentation

## 2023-09-15 DIAGNOSIS — K317 Polyp of stomach and duodenum: Secondary | ICD-10-CM

## 2023-09-15 HISTORY — PX: ESOPHAGOGASTRODUODENOSCOPY: SHX5428

## 2023-09-15 LAB — BASIC METABOLIC PANEL WITH GFR
Anion gap: 18 — ABNORMAL HIGH (ref 5–15)
BUN: 17 mg/dL (ref 6–20)
CO2: 23 mmol/L (ref 22–32)
Calcium: 9.2 mg/dL (ref 8.9–10.3)
Chloride: 95 mmol/L — ABNORMAL LOW (ref 98–111)
Creatinine, Ser: 1.44 mg/dL — ABNORMAL HIGH (ref 0.61–1.24)
GFR, Estimated: >60 mL/min (ref >60)
Glucose, Bld: 174 mg/dL — ABNORMAL HIGH (ref 70–99)
Potassium: 3.4 mmol/L — ABNORMAL LOW (ref 3.5–5.1)
Sodium: 136 mmol/L (ref 135–145)

## 2023-09-15 SURGERY — EGD (ESOPHAGOGASTRODUODENOSCOPY)
Anesthesia: General

## 2023-09-15 MED ORDER — LACTATED RINGERS IV SOLN
INTRAVENOUS | Status: DC | PRN
Start: 2023-09-15 — End: 2023-09-15

## 2023-09-15 MED ORDER — PROPOFOL 500 MG/50ML IV EMUL
INTRAVENOUS | Status: DC | PRN
  Administered 2023-09-15: 100 ug/kg/min via INTRAVENOUS

## 2023-09-15 MED ORDER — KETAMINE HCL 50 MG/5ML IJ SOSY
PREFILLED_SYRINGE | INTRAMUSCULAR | Status: DC | PRN
  Administered 2023-09-15: 20 mg via INTRAVENOUS

## 2023-09-15 MED ORDER — GLYCOPYRROLATE PF 0.2 MG/ML IJ SOSY
PREFILLED_SYRINGE | INTRAMUSCULAR | Status: DC | PRN
  Administered 2023-09-15: .2 mg via INTRAVENOUS

## 2023-09-15 MED ORDER — PANTOPRAZOLE SODIUM 40 MG PO TBEC
40.0000 mg | DELAYED_RELEASE_TABLET | Freq: Two times a day (BID) | ORAL | 0 refills | Status: AC
Start: 2023-09-15 — End: ?

## 2023-09-15 MED ORDER — LIDOCAINE 2% (20 MG/ML) 5 ML SYRINGE
INTRAMUSCULAR | Status: AC
  Filled 2023-09-15: qty 25

## 2023-09-15 MED ORDER — ACCU-CHEK GUIDE TEST VI STRP: 1.0000 | ORAL_STRIP | Freq: Two times a day (BID) | 2 refills | Status: AC

## 2023-09-15 MED ORDER — LACTATED RINGERS IV SOLN: INTRAVENOUS | Status: DC

## 2023-09-15 MED ORDER — KETAMINE HCL 50 MG/5ML IJ SOSY
PREFILLED_SYRINGE | INTRAMUSCULAR | Status: AC
  Filled 2023-09-15: qty 5

## 2023-09-15 NOTE — Telephone Encounter (Signed)
 Rx refill for Accu-chek Guide test strips sent to [facility] in [place].

## 2023-09-15 NOTE — Telephone Encounter (Signed)
 Patient is requesting a test strips for his Accu Chek Guide. He would like that sent to [facility]

## 2023-09-15 NOTE — Discharge Instructions (Signed)
 You are being discharged to home.  Resume your previous diet.  We are waiting for your pathology results.  Take Protonix  (pantoprazole ) 40 mg by mouth twice a day for four weeks,  then decrease back to once a day.

## 2023-09-15 NOTE — Anesthesia Preprocedure Evaluation (Signed)
 Anesthesia Evaluation  Patient identified by MRN, date of birth, ID band Patient awake    Reviewed: Allergy & Precautions, H&P , NPO status , Patient's Chart, lab work & pertinent test results, reviewed documented beta blocker date and time   Airway Mallampati: III  TM Distance: >3 FB Neck ROM: full    Dental no notable dental hx.    Pulmonary sleep apnea , former smoker   Pulmonary exam normal breath sounds clear to auscultation       Cardiovascular Exercise Tolerance: Good hypertension, +CHF   Rhythm:regular Rate:Normal     Neuro/Psych  PSYCHIATRIC DISORDERS  Depression    negative neurological ROS     GI/Hepatic negative GI ROS, Neg liver ROS,,,  Endo/Other  diabetes  Class 4 obesity  Renal/GU Renal disease  negative genitourinary   Musculoskeletal   Abdominal   Peds  Hematology  (+) Blood dyscrasia, anemia   Anesthesia Other Findings High-flow O2  Reproductive/Obstetrics negative OB ROS                              Anesthesia Physical Anesthesia Plan  ASA: 3  Anesthesia Plan: General   Post-op Pain Management:    Induction:   PONV Risk Score and Plan: Propofol  infusion  Airway Management Planned:   Additional Equipment:   Intra-op Plan:   Post-operative Plan:   Informed Consent: I have reviewed the patients History and Physical, chart, labs and discussed the procedure including the risks, benefits and alternatives for the proposed anesthesia with the patient or authorized representative who has indicated his/her understanding and acceptance.     Dental Advisory Given  Plan Discussed with: CRNA  Anesthesia Plan Comments:         Anesthesia Quick Evaluation

## 2023-09-15 NOTE — Transfer of Care (Signed)
 Immediate Anesthesia Transfer of Care Note  Patient: Melvin Ray  Procedure(s) Performed: EGD (ESOPHAGOGASTRODUODENOSCOPY)  Patient Location: PACU  Anesthesia Type:MAC  Level of Consciousness: awake and alert   Airway & Oxygen Therapy: Patient Spontanous Breathing and Patient connected to nasal cannula oxygen  Post-op Assessment: Report given to RN and Post -op Vital signs reviewed and stable  Post vital signs: Reviewed and stable  Last Vitals:  Vitals Value Taken Time  BP    Temp    Pulse    Resp    SpO2      Last Pain:  Vitals:   09/15/23 1107  TempSrc: Oral  PainSc: 0-No pain      Patients Stated Pain Goal: 5 (09/15/23 1107)  Complications: No notable events documented.

## 2023-09-15 NOTE — Op Note (Signed)
 [facility] Patient Name: Melvin Ray Procedure Date: 09/15/2023 12:10 PM MRN: [id number] Date of Birth: 1985-07-04 Attending MD: Toribio Fortune , , [id number] CSN: [id number] Age: 38 Admit Type: Outpatient Procedure:                Upper GI endoscopy Indications:              For therapy of gastric polyps Providers:                Toribio Fortune, Jon A. Gerome RN, RN, Bascom Blush Referring MD:              Medicines:                Monitored Anesthesia Care Complications:            No immediate complications. Estimated Blood Loss:     Estimated blood loss: none. Procedure:                Pre-Anesthesia Assessment:                           - Prior to the procedure, a History and Physical                            was performed, and patient medications, allergies                            and sensitivities were reviewed. The patient's                            tolerance of previous anesthesia was reviewed.                           - The risks and benefits of the procedure and the                            sedation options and risks were discussed with the                            patient. All questions were answered and informed                            consent was obtained.                           - ASA Grade Assessment: III - A patient with severe                            systemic disease.                           After obtaining informed consent, the endoscope was                            passed under direct vision. Throughout the  procedure, the patient's blood pressure, pulse, and                            oxygen saturations were monitored continuously. The                            GIF-H190 ([id number]) scope was introduced through the                            mouth, and advanced to the second part of duodenum.                            The upper GI endoscopy was accomplished without                             difficulty. The patient tolerated the procedure                            well. Scope In: 1:11:58 PM Scope Out: 1:16:44 PM Total Procedure Duration: 0 hours 4 minutes 46 seconds  Findings:      The examined esophagus was normal.      The Z-line was regular and was found 42 cm from the incisors.      Two 3 to 6 mm sessile polyps were found in the gastric antrum. These       polyps were removed with a cold snare. Resection and retrieval were       complete.      The examined duodenum was normal. Impression:               - Normal esophagus.                           - Z-line regular, 42 cm from the incisors.                           - Two gastric polyps. Resected and retrieved.                           - Normal examined duodenum. Moderate Sedation:      Per Anesthesia Care Recommendation:           - Discharge patient to home (ambulatory).                           - Resume previous diet.                           - Await pathology results.                           - Use Protonix  (pantoprazole ) 40 mg PO BID for 4                            weeks, then decrease back to once a day. Procedure Code(s):        --- Professional ---  43251, Esophagogastroduodenoscopy, flexible,                            transoral; with removal of tumor(s), polyp(s), or                            other lesion(s) by snare technique Diagnosis Code(s):        --- Professional ---                           K31.7, Polyp of stomach and duodenum CPT copyright 2022 American Medical Association. All rights reserved. The codes documented in this report are preliminary and upon coder review may  be revised to meet current compliance requirements. Toribio Fortune, MD Toribio Fortune,  09/15/2023 1:22:17 PM This report has been signed electronically. Number of Addenda: 0

## 2023-09-15 NOTE — H&P (Signed)
 Melvin Ray is an 38 y.o. male.   Chief Complaint: gastric polyps HPI: 38 year old male with past medical history of diabetes, hypertension, CKD, obesity, OSA, hypertension, SVT, coming for history of gastric polyps.  The patient denies having any nausea, vomiting, fever, chills, hematochezia, melena, hematemesis, abdominal distention, abdominal pain, diarrhea, jaundice, pruritus or weight loss.  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   SVT (supraventricular tachycardia) (HCC)    Tobacco abuse     Past Surgical History:  Procedure Laterality Date   APPENDECTOMY     BIOPSY  01/07/2022   Procedure: BIOPSY;  Surgeon: Melvin Ray Sieving, MD;  [facility];  Service: Gastroenterology;;   COLONOSCOPY WITH PROPOFOL  N/A 01/07/2022   Procedure: COLONOSCOPY WITH PROPOFOL ;  Surgeon: Melvin Ray Sieving, MD;  [facility];  Service: Gastroenterology;  Laterality: N/A;  1030 ASA 3   ESOPHAGOGASTRODUODENOSCOPY  01/07/2022   Procedure: ESOPHAGOGASTRODUODENOSCOPY (EGD);  Surgeon: Melvin Ray, Sieving, MD;  [facility]: [facility];  Service: Gastroenterology;;   ESOPHAGOGASTRODUODENOSCOPY N/A 06/16/2023   Procedure: EGD (ESOPHAGOGASTRODUODENOSCOPY);  Surgeon: Melvin Ray, Sieving, MD;  [facility];  Service: Gastroenterology;  Laterality: N/A;  2:30PM;ASA 3   GIVENS CAPSULE STUDY N/A 02/05/2022   Procedure: GIVENS CAPSULE STUDY;  Surgeon: Melvin Ray Sieving, MD;  [facility]: [facility];  Service: Gastroenterology;  Laterality: N/A;  8:30am    Family History  Problem Relation Age of Onset   Heart failure Father    Hypertension Father    Diabetes Maternal Grandmother    Heart failure Paternal Grandmother         transplant   Social History:  reports that he has quit smoking. His smoking use included cigars. He has been exposed to tobacco smoke. He has quit using smokeless tobacco. He reports that he does not drink alcohol and does not use drugs.  Allergies: No Known Allergies  Medications Prior to Admission  Medication Sig Dispense Refill   ACCU-CHEK GUIDE test strip 3 (three) times daily.     Accu-Chek Softclix Lancets lancets 3 (three) times daily.     albuterol (VENTOLIN HFA) 108 (90 Base) MCG/ACT inhaler Inhale 1-2 puffs into the lungs every 6 (six) hours as needed for wheezing or shortness of breath.     amLODipine  (NORVASC ) 2.5 MG tablet Take 1 tablet (2.5 mg total) by mouth daily. (Patient taking differently: Take 5 mg by mouth daily.) 90 tablet 3   chlorthalidone  (HYGROTON ) 25 MG tablet TAKE 1 TABLET ONCE DAILY. 90 tablet 1   Cholecalciferol  (VITAMIN D3) 50 MCG (2000 UT) TABS Take 2,000 Units by mouth daily.     cloNIDine (CATAPRES) 0.3 MG tablet Take 0.3 mg by mouth 2 (two) times daily.     diltiazem (CARDIZEM CD) 300 MG 24 hr capsule Take 300 mg by mouth daily.     FEROSUL 325 (65 Fe) MG tablet Take 325 mg by mouth daily.     FLUoxetine (PROZAC) 20 MG capsule Take 20 mg by mouth daily.     Fluticasone-Salmeterol (ADVAIR) 250-50 MCG/DOSE AEPB Inhale 1 puff into the lungs daily.     hydrALAZINE  (APRESOLINE ) 100 MG tablet Take 100 mg by mouth 2 (two) times daily.     losartan (COZAAR) 100  MG tablet Take 100 mg by mouth at bedtime.     metFORMIN  (GLUCOPHAGE -XR) 500 MG 24 hr tablet Take 1 tablet (500 mg total) by mouth daily with breakfast. 90 tablet 1   metoprolol (TOPROL-XL) 200 MG 24 hr tablet Take 200 mg by mouth daily.     NON FORMULARY Pt uses a cpap nightly     pantoprazole  (PROTONIX ) 40 MG tablet Take 40 mg by mouth daily.     potassium chloride  (KLOR-CON ) 10 MEQ tablet Take 10 mEq by mouth daily.     terbinafine  (LAMISIL ) 250 MG tablet Take 1 tablet (250 mg total) by mouth daily. 90  tablet 0   traZODone (DESYREL) 100 MG tablet Take 200 mg by mouth at bedtime.      empagliflozin  (JARDIANCE ) 10 MG TABS tablet Take 1 tablet (10 mg total) by mouth daily before breakfast. 90 tablet 1   Semaglutide , 2 MG/DOSE, (OZEMPIC , 2 MG/DOSE,) 8 MG/3ML SOPN Inject 2 mg into the skin once a week. Needs appointment for refills 3 mL 0    No results found for this or any previous visit (from the past 48 hours). No results found.  Review of Systems  All other systems reviewed and are negative.   Blood pressure 127/80, pulse 66, temperature 98.8 F (37.1 C), temperature source Oral, resp. rate 14, height 6' (1.829 m), weight (!) 164.7 kg, SpO2 99%. Physical Exam  GENERAL: The patient is AO x3, in no acute distress. HEENT: Head is normocephalic and atraumatic. EOMI are intact. Mouth is well hydrated and without lesions. NECK: Supple. No masses LUNGS: Clear to auscultation. No presence of rhonchi/wheezing/rales. Adequate chest expansion HEART: RRR, normal s1 and s2. ABDOMEN: Soft, nontender, no guarding, no peritoneal signs, and nondistended. BS +. No masses. EXTREMITIES: Without any cyanosis, clubbing, rash, lesions or edema. NEUROLOGIC: AOx3, no focal motor deficit. SKIN: no jaundice, no rashes  Assessment/Plan 38 year old male with past medical history of diabetes, hypertension, CKD, obesity, OSA, hypertension, SVT, coming for history of gastric polyps.  Will proceed with EGD. Melvin Melvin Flavors, MD 09/15/2023, 11:16 AM

## 2023-09-16 ENCOUNTER — Ambulatory Visit (INDEPENDENT_AMBULATORY_CARE_PROVIDER_SITE_OTHER): Payer: Self-pay | Admitting: Gastroenterology

## 2023-09-16 ENCOUNTER — Encounter (HOSPITAL_COMMUNITY): Payer: Self-pay | Admitting: Gastroenterology

## 2023-09-16 LAB — SURGICAL PATHOLOGY

## 2023-09-18 NOTE — Progress Notes (Signed)
Patient result letter mailed procedure note and pathology result faxed to PCP

## 2023-09-19 NOTE — Anesthesia Postprocedure Evaluation (Signed)
 Anesthesia Post Note  Patient: Melvin Ray  Procedure(s) Performed: EGD (ESOPHAGOGASTRODUODENOSCOPY)  Patient location during evaluation: Phase II Anesthesia Type: General Level of consciousness: awake Pain management: pain level controlled Vital Signs Assessment: post-procedure vital signs reviewed and stable Respiratory status: spontaneous breathing and respiratory function stable Cardiovascular status: blood pressure returned to baseline and stable Postop Assessment: no headache and no apparent nausea or vomiting Anesthetic complications: no Comments: Late entry   No notable events documented.   Last Vitals:  Vitals:   09/15/23 1329 09/15/23 1330  BP:  (!) 112/58  Pulse: (P) 91   Resp: (!) (P) 22   Temp:    SpO2: (P) 100%     Last Pain:  Vitals:   09/16/23 1426  TempSrc:   PainSc: 0-No pain                 [name]

## 2023-09-25 ENCOUNTER — Other Ambulatory Visit: Payer: Self-pay | Admitting: Cardiovascular Disease

## 2023-10-01 ENCOUNTER — Telehealth (HOSPITAL_BASED_OUTPATIENT_CLINIC_OR_DEPARTMENT_OTHER): Payer: Self-pay | Admitting: Cardiovascular Disease

## 2023-10-01 NOTE — Telephone Encounter (Signed)
*  STAT* If patient is at the pharmacy, call can be transferred to refill team.   1. Which medications need to be refilled? (please list name of each medication and dose if known)   Semaglutide , 2 MG/DOSE, (OZEMPIC , 2 MG/DOSE,) 8 MG/3ML SOPN   2. Would you like to learn more about the convenience, safety, & potential cost savings by using the [facility]?   3. Are you open to using the [facility] (Type [facility]. ).  4. Which pharmacy/location (including street and city if local pharmacy) is medication to be sent to?  [facility] [address], [place] - [address]   5. Do they need a 30 day or 90 day supply?   Patient stated he is completely out of this medication.

## 2023-10-06 ENCOUNTER — Other Ambulatory Visit (HOSPITAL_COMMUNITY): Payer: Self-pay

## 2023-10-06 ENCOUNTER — Other Ambulatory Visit: Payer: Self-pay

## 2023-10-06 ENCOUNTER — Telehealth: Payer: Self-pay | Admitting: Cardiovascular Disease

## 2023-10-06 DIAGNOSIS — Z794 Long term (current) use of insulin: Secondary | ICD-10-CM

## 2023-10-06 DIAGNOSIS — E1122 Type 2 diabetes mellitus with diabetic chronic kidney disease: Secondary | ICD-10-CM

## 2023-10-06 MED ORDER — OZEMPIC (2 MG/DOSE) 8 MG/3ML ~~LOC~~ SOPN
2.0000 mg | PEN_INJECTOR | SUBCUTANEOUS | 0 refills | Status: DC
  Filled 2023-10-06: qty 3, 28d supply, fill #0

## 2023-10-06 NOTE — Telephone Encounter (Signed)
*  STAT* If patient is at the pharmacy, call can be transferred to refill team.   1. Which medications need to be refilled? (please list name of each medication and dose if known)  Semaglutide , 2 MG/DOSE, (OZEMPIC , 2 MG/DOSE,) 8 MG/3ML SOPN  2. Which pharmacy/location (including street and city if local pharmacy) is medication to be sent to? [facility] [address], [place] - [address]  3. Do they need a 30 day or 90 day supply?  90 day supply

## 2023-10-07 ENCOUNTER — Telehealth: Payer: Self-pay | Admitting: "Endocrinology

## 2023-10-07 ENCOUNTER — Other Ambulatory Visit: Payer: Self-pay | Admitting: "Endocrinology

## 2023-10-07 DIAGNOSIS — E1122 Type 2 diabetes mellitus with diabetic chronic kidney disease: Secondary | ICD-10-CM

## 2023-10-07 DIAGNOSIS — Z794 Long term (current) use of insulin: Secondary | ICD-10-CM

## 2023-10-07 MED ORDER — OZEMPIC (2 MG/DOSE) 8 MG/3ML ~~LOC~~ SOPN: 2.0000 mg | PEN_INJECTOR | SUBCUTANEOUS | 1 refills | Status: AC

## 2023-10-07 MED ORDER — OZEMPIC (2 MG/DOSE) 8 MG/3ML ~~LOC~~ SOPN: 2.0000 mg | PEN_INJECTOR | SUBCUTANEOUS | 0 refills | Status: DC

## 2023-10-07 NOTE — Telephone Encounter (Signed)
 Pt made aware.

## 2023-10-07 NOTE — Telephone Encounter (Signed)
 Pt states that his Cardiologist prescribed him ozempic  about one year ago and he is asking if Dr Lenis could refill this as she is not responding to his refill. [facility] [place]

## 2023-10-22 ENCOUNTER — Telehealth: Payer: Self-pay | Admitting: "Endocrinology

## 2023-10-22 NOTE — Telephone Encounter (Signed)
 Pt called asking about the rx for ozempic , [facility].  Pt to call [facility] and check on rx for ozempic 

## 2023-11-04 ENCOUNTER — Telehealth: Payer: Self-pay | Admitting: Pharmacy Technician

## 2023-11-04 ENCOUNTER — Other Ambulatory Visit (HOSPITAL_COMMUNITY): Payer: Self-pay

## 2023-11-04 NOTE — Telephone Encounter (Signed)
 Pharmacy Patient Advocate Encounter  Received notification from [facility] MEDICAID that Prior Authorization for Ozempic  (2 MG/DOSE) 8MG /3ML pen-injectors  has been APPROVED from 10/21/23 to 11/03/24. Ran test claim, Copay is $4.00. This test claim was processed through [facility]- copay amounts may vary at other pharmacies due to pharmacy/plan contracts, or as the patient moves through the different stages of their insurance plan.   PA #/Case ID/Reference #: [id number]

## 2023-11-04 NOTE — Telephone Encounter (Signed)
 Pharmacy Patient Advocate Encounter   Received notification from CoverMyMeds that prior authorization for Ozempic  (2 MG/DOSE) 8MG /3ML pen-injectors  is required/requested.   Insurance verification completed.   The patient is insured through [facility] MEDICAID .   Per test claim: PA required; PA submitted to above mentioned insurance via Latent Key/confirmation #/EOC [id number] Status is pending

## 2023-11-30 ENCOUNTER — Encounter: Payer: Self-pay | Admitting: Podiatry

## 2023-11-30 ENCOUNTER — Ambulatory Visit (INDEPENDENT_AMBULATORY_CARE_PROVIDER_SITE_OTHER): Admitting: Podiatry

## 2023-11-30 DIAGNOSIS — M79675 Pain in left toe(s): Secondary | ICD-10-CM | POA: Diagnosis not present

## 2023-11-30 DIAGNOSIS — M79674 Pain in right toe(s): Secondary | ICD-10-CM

## 2023-11-30 DIAGNOSIS — E1159 Type 2 diabetes mellitus with other circulatory complications: Secondary | ICD-10-CM

## 2023-11-30 DIAGNOSIS — B351 Tinea unguium: Secondary | ICD-10-CM | POA: Diagnosis not present

## 2023-11-30 NOTE — Progress Notes (Signed)
This patient returns to my office for at risk foot care.  This patient requires this care by a professional since this patient will be at risk due to having diabetes.   This patient is unable to cut nails himself since the patient cannot reach his nails.These nails are painful walking and wearing shoes.  This patient presents for at risk foot care today. His caregiver states he has developed a painful callus on the inside of left foot. He is brought to the office by caregiver from  [place].  General Appearance  Alert, conversant and in no acute stress.  Vascular  Dorsalis pedis and posterior tibial  pulses are not  palpable  Bilaterally due to swelling..  Capillary return is within normal limits  bilaterally. Temperature is within normal limits  bilaterally.  Neurologic  Senn-Weinstein monofilament wire test within normal limits  bilaterally. Muscle power within normal limits bilaterally.  Nails Thick disfigured discolored nails with subungual debris  from hallux to fifth toes bilaterally. No evidence of bacterial infection or drainage bilaterally.  Orthopedic  No limitations of motion  feet .  No crepitus or effusions noted.  No bony pathology or digital deformities noted.  Skin  normotropic skin with no porokeratosis noted bilaterally.  No signs of infections or ulcers noted.    Onychomycosis  Pain in right toes  Pain in left toes  Consent was obtained for treatment procedures.   Mechanical debridement of nails 1-5  bilaterally performed with a nail nipper.  Filed with dremel without incident.    Return office visit   3 months                 Told patient to return for periodic foot care and evaluation due to potential at risk complications.   Domitila Stetler [name]  

## 2023-12-09 ENCOUNTER — Encounter (INDEPENDENT_AMBULATORY_CARE_PROVIDER_SITE_OTHER): Payer: Self-pay | Admitting: Gastroenterology

## 2023-12-21 ENCOUNTER — Encounter (HOSPITAL_BASED_OUTPATIENT_CLINIC_OR_DEPARTMENT_OTHER): Payer: Self-pay

## 2023-12-23 ENCOUNTER — Encounter (HOSPITAL_BASED_OUTPATIENT_CLINIC_OR_DEPARTMENT_OTHER): Payer: Self-pay

## 2023-12-24 ENCOUNTER — Ambulatory Visit (INDEPENDENT_AMBULATORY_CARE_PROVIDER_SITE_OTHER): Admitting: Family

## 2023-12-24 ENCOUNTER — Encounter (HOSPITAL_BASED_OUTPATIENT_CLINIC_OR_DEPARTMENT_OTHER): Payer: Self-pay | Admitting: Family

## 2023-12-24 VITALS — BP 110/70 | HR 91 | Ht 72.0 in | Wt 374.9 lb

## 2023-12-24 DIAGNOSIS — I1A0 Resistant hypertension: Secondary | ICD-10-CM

## 2023-12-24 MED ORDER — OLMESARTAN-AMLODIPINE-HCTZ 40-5-25 MG PO TABS
1.0000 | ORAL_TABLET | Freq: Every day | ORAL | 5 refills | Status: AC
Start: 2023-12-24 — End: ?

## 2023-12-24 MED ORDER — AMLODIPINE BESYLATE 2.5 MG PO TABS: 5.0000 mg | ORAL_TABLET | Freq: Every day | ORAL | Status: DC

## 2023-12-24 MED ORDER — CLONIDINE HCL 0.2 MG PO TABS: 0.2000 mg | ORAL_TABLET | Freq: Two times a day (BID) | ORAL | 5 refills | Status: AC

## 2023-12-24 NOTE — Patient Instructions (Addendum)
 Medication Instructions:   STOP Amlodipine  STOP Chlorthalidone  STOP Losartan  START Olmesartan-Amlodipine -hydrochlorothiazide  40-5-25mg  once per day  REDUCE Clonidine to 0.2mg  twice daily  CONTINUE Diltiazem 300mg  daily  CONTINUE Toprol 200mg  daily  CONTINUE Hydralazine  100mg  twice daily  Labs: Your physician recommends that you return for lab work in 1-2 weeks: BMET  Testing/Procedures: Your physician has requested that you have a renal artery duplex. During this test, an ultrasound is used to evaluate blood flow to the kidneys. Allow one hour for this exam. Do not eat after midnight the day before and avoid carbonated beverages. Take your medications as you usually do.   Follow-Up: Please follow up in 2-3 months in [facility] with Dr. Raford, Reche Finder, NP or Allean Mink PharmD    Special Instructions:   Please check blood pressure 3 times per week at home.

## 2023-12-24 NOTE — Progress Notes (Signed)
 [facility] Assessment:    Date:  12/24/2023   ID:  Melvin Ray, DOB 1985-09-30, MRN [id number]  PCP:  Vicky Berg, MD  Cardiologist:  Alvan Carrier, MD  Nephrologist: Rachele, MD  Referring MD: Vicky Berg, MD   CC: Hypertension  History of Present Illness:    Melvin Ray is a 38 y.o. male with a hx of DM2, morbid obesity, HTN, OSA, HLD here to follow up in the [facility].   At [facility] visit 03/24/22 with Dr. Raford, Amlodipine  added to regimen of chlorthalidone , diltiazem, clonidine, hydralazine , losartan, metoprolol.   Evaluated spring 03/2022 by PharmD team and Ozempic  initiated and uptitrated.  2011 normal adrenals by imaging. 04/2022 no evidence of hyperaldosteronism. 06/2023 normal TSH.  Presents today for follow up with his case Production Assistant, Radio. He is doing well from a cardiac standpoint. He is checking BP at home: once weekly with last known BP 110/70. Does not bring a log with him today. He notes occasional positional lightheadedness. He denies chest pain, shortness of breath, edema, claudication. He reports compliance with BiPAP and is sleeping well. In warmer weather, he had been walking daily in his neighborhood for 1 hour, with occasional rest breaks. He has not been able to do so recently due to weather and does not have transportation to a nearby fitness facility. He has been working on portion control, on Ozempic . Notes GI upset the day after each dose without acute concerns.   He completed labs ordered by Dr. 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   SVT (supraventricular tachycardia)     Tobacco abuse     Past Surgical History:  Procedure Laterality Date   APPENDECTOMY     BIOPSY  01/07/2022   Procedure: BIOPSY;  Surgeon: [name], MD;  [facility];  Service: Gastroenterology;;   COLONOSCOPY WITH PROPOFOL  N/A 01/07/2022   Procedure: COLONOSCOPY WITH PROPOFOL ;  Surgeon: [name], MD;  [facility];  Service: Gastroenterology;  Laterality: N/A;  1030 ASA 3   ESOPHAGOGASTRODUODENOSCOPY  01/07/2022   Procedure: ESOPHAGOGASTRODUODENOSCOPY (EGD);  Surgeon: [name], Sieving, MD;  [facility];  Service: Gastroenterology;;   ESOPHAGOGASTRODUODENOSCOPY N/A 06/16/2023   Procedure: EGD (ESOPHAGOGASTRODUODENOSCOPY);  Surgeon: [name], Sieving, MD;  [facility];  Service: Gastroenterology;  Laterality: N/A;  2:30PM;ASA 3   ESOPHAGOGASTRODUODENOSCOPY N/A 09/15/2023   Procedure: EGD (ESOPHAGOGASTRODUODENOSCOPY);  Surgeon: [name], Sieving, MD;  [facility];  Service: Gastroenterology;  Laterality: N/A;  730am, asa 3   GIVENS CAPSULE STUDY N/A 02/05/2022   Procedure: GIVENS CAPSULE STUDY;  Surgeon: [name], MD;  [facility];  Service: Gastroenterology;  Laterality: N/A;  8:30am    Current Medications: Current Meds  Medication Sig   Accu-Chek Softclix Lancets lancets 3 (three) times daily.   albuterol (VENTOLIN HFA) 108 (90 Base) MCG/ACT inhaler Inhale 1-2 puffs into the lungs every 6 (six) hours as needed for wheezing or shortness of breath.   Cholecalciferol  (VITAMIN D3) 50 MCG (2000 UT) TABS Take 2,000 Units by mouth daily.   diltiazem (CARDIZEM CD) 300 MG 24  hr capsule Take 300 mg by mouth daily.   empagliflozin  (JARDIANCE ) 10 MG TABS tablet Take 1 tablet (10 mg total) by mouth daily before breakfast.   FEROSUL 325 (65 Fe) MG tablet Take 325 mg by mouth daily.   FLUoxetine (PROZAC) 20 MG capsule Take 20 mg by mouth daily.    Fluticasone-Salmeterol (ADVAIR) 250-50 MCG/DOSE AEPB Inhale 1 puff into the lungs daily.   glucose blood (ACCU-CHEK GUIDE TEST) test strip 1 each by Other route 2 (two) times daily. Use as instructed   hydrALAZINE  (APRESOLINE ) 100 MG tablet Take 100 mg by mouth 2 (two) times daily.   metFORMIN  (GLUCOPHAGE -XR) 500 MG 24 hr tablet Take 1 tablet (500 mg total) by mouth daily with breakfast.   metoprolol (TOPROL-XL) 200 MG 24 hr tablet Take 200 mg by mouth daily.   NON FORMULARY Pt uses a cpap nightly   Olmesartan-amLODIPine -HCTZ 40-5-25 MG TABS Take 1 tablet by mouth daily.   pantoprazole  (PROTONIX ) 40 MG tablet Take 1 tablet (40 mg total) by mouth 2 (two) times daily.   potassium chloride  (KLOR-CON ) 10 MEQ tablet Take 10 mEq by mouth daily.   Semaglutide , 2 MG/DOSE, (OZEMPIC , 2 MG/DOSE,) 8 MG/3ML SOPN Inject 2 mg into the skin once a week. Needs appointment for refills   terbinafine  (LAMISIL ) 250 MG tablet Take 1 tablet (250 mg total) by mouth daily.   traZODone (DESYREL) 100 MG tablet Take 200 mg by mouth at bedtime.    [DISCONTINUED] amLODipine  (NORVASC ) 2.5 MG tablet Take 1 tablet (2.5 mg total) by mouth daily. (Patient taking differently: Take 5 mg by mouth daily.)   [DISCONTINUED] amLODipine  (NORVASC ) 5 MG tablet Take 5 mg by mouth daily.   [DISCONTINUED] chlorthalidone  (HYGROTON ) 25 MG tablet TAKE 1 TABLET ONCE DAILY.   [DISCONTINUED] cloNIDine (CATAPRES) 0.3 MG tablet Take 0.3 mg by mouth 2 (two) times daily.   [DISCONTINUED] losartan (COZAAR) 100 MG tablet Take 100 mg by mouth at bedtime.     Allergies:   Patient has no known allergies.   Social History   Socioeconomic History   Marital status: Single    Spouse name: Not on file   Number of children: Not on file   Years of education: Not on file   Highest education level: Not on file  Occupational History   Not on file  Tobacco Use   Smoking status: Former    Types: Cigars    Passive exposure: Current   Smokeless tobacco:  Former  Building Services Engineer status: Former  Substance and Sexual Activity   Alcohol use: Never   Drug use: Never   Sexual activity: Not on file  Other Topics Concern   Not on file  Social History Narrative   Not on file   Social Drivers of Health   Financial Resource Strain: Medium Risk (03/24/2022)   Overall Financial Resource Strain (CARDIA)    Difficulty of Paying Living Expenses: Somewhat hard  Food Insecurity: No Food Insecurity (03/24/2022)   Hunger Vital Sign    Worried About Running Out of Food in the Last Year: Never true    Ran Out of Food in the Last Year: Never true  Transportation Needs: No Transportation Needs (03/24/2022)   PRAPARE - Administrator, Civil Service (Medical): No    Lack of Transportation (Non-Medical): No  Physical Activity: Insufficiently Active (07/31/2020)   Exercise Vital Sign    Days of Exercise per Week: 3 days    Minutes of Exercise per  Session: 40 min  Stress: Not on file  Social Connections: Not on file     Family History: The patient's family history includes Diabetes in his maternal grandmother; Heart failure in his father and paternal grandmother; Hypertension in his father.  ROS:   Please see the history of present illness.    All other systems reviewed and are negative.  EKGs/Labs/Other Studies Reviewed:         Recent Labs: 07/08/2023: ALT 27; TSH 2.790 09/10/2023: Hemoglobin 13.3; Platelets 211 09/11/2023: Magnesium  1.5 09/15/2023: BUN 17; Creatinine, Ser 1.44; Potassium 3.4; Sodium 136   Recent Lipid Panel    Component Value Date/Time   CHOL 171 07/08/2023 1023   TRIG 201 (H) 07/08/2023 1023   HDL 36 (L) 07/08/2023 1023   CHOLHDL 4.8 07/08/2023 1023   LDLCALC 100 (H) 07/08/2023 1023    Physical Exam:   VS:  BP 110/70 (BP Location: Right Arm, Patient Position: Sitting, Cuff Size: Large)   Pulse 91   Ht 6' (1.829 m)   Wt (!) 374 lb 14.4 oz (170.1 kg)   SpO2 94%   BMI 50.85 kg/m  , BMI Body mass index is  50.85 kg/m. GENERAL:  Well appearing HEENT: Pupils equal round and reactive, fundi not visualized, oral mucosa unremarkable NECK:  No jugular venous distention, waveform within normal limits, carotid upstroke brisk and symmetric, no bruits, no thyromegaly LYMPHATICS:  No cervical adenopathy LUNGS:  Clear to auscultation bilaterally HEART:  RRR.  PMI not displaced or sustained,S1 and S2 within normal limits, no S3, no S4, no clicks, no rubs, no murmurs EXT:  2+ pulses throughout, no edema, no cyanosis no clubbing SKIN:  Acanthosis nigricans of posterior neck; No rashes, no nodules NEURO:  Cranial nerves II through XII grossly intact, motor grossly intact throughout PSYCH:  Cognitively intact, oriented to person place and time  Wt Readings from Last 3 Encounters:  12/24/23 (!) 374 lb 14.4 oz (170.1 kg)  09/15/23 (!) 362 lb 15.8 oz (164.7 kg)  09/11/23 (!) 363 lb (164.7 kg)   BP Readings from Last 3 Encounters:  12/24/23 110/70  09/15/23 (!) 112/58  09/11/23 (!) 143/93     ASSESSMENT/PLAN:    HTN, goal <130/80- BP at goal by in office; 12/18/23 labs: BUN, creat 1.22, Na 139, K+ 3.9; without symptoms of headaches, vision changes; commended on weight loss and exercise efforts for hypertension beneft To simplify medication regimen/reduce pill burden with stable BP readings:  Discontinue amlodipine , chlorthalidone , losartan Start olmesartan-amlodipine -hydrochlorothiazide  40-5-25mg  daily Reduce Clonidine to 0.2mg  BID due to lightheadedness likely orthostasis Continue diltiazem 300mg  daily, toprol 200mg  daily, hydralazine  100mg  twice daily BMET in 1-2 weeks Will have him check BP at home 3x/weekly  Plan for renal artery duplex for further secondary workup. If no RAS, could consider renal denervation  OSA on BiPAP - follows with pulmonary; reports compliance with BiPAP;  Screening for Secondary Hypertension:     12/24/2023    4:43 PM  Causes  Renovascular HTN Screened     -  Comments 12/24/23 renal duplex ordered  Sleep Apnea Screened     - Comments on BIPAP  Thyroid Disease Screened  Hyperaldosteronism Screened     - Comments screened, negative  Pheochromocytoma Screened     - Comments screened, negative  Coarctation of the Aorta N/A     - Comments symmetric BP  Compliance Screened     - Comments uses adherence packaging from [facility] pharmacy    Relevant Labs/Studies:  Latest Ref Rng & Units 09/15/2023   11:10 AM 09/11/2023    3:31 PM 09/10/2023    2:34 PM  Basic Labs  Sodium 135 - 145 mmol/L 136  133  134   Potassium 3.5 - 5.1 mmol/L 3.4  2.9  3.1   Creatinine 0.61 - 1.24 mg/dL 8.55  8.60  8.68        Latest Ref Rng & Units 07/08/2023   10:23 AM 07/23/2021   12:00 AM  Thyroid   TSH 0.450 - 4.500 uIU/mL 2.790  4.81         This result is from an external source.       Latest Ref Rng & Units 05/08/2022    3:41 PM  Renin/Aldosterone   Aldosterone 0.0 - 30.0 ng/dL 5.0   Aldos/Renin Ratio 0.0 - 30.0 16.7              12/24/2023    2:35 PM  Renovascular   Renal Artery US  Completed Yes      Disposition:    FU with MD/APP/PharmD in 2-3 months    Medication Adjustments/Labs and Tests Ordered: Current medicines are reviewed at length with the patient today.  Concerns regarding medicines are outlined above.  Orders Placed This Encounter  Procedures   Basic metabolic panel with GFR   VAS US  RENAL ARTERY DUPLEX   Meds ordered this encounter  Medications   DISCONTD: amLODipine  (NORVASC ) 2.5 MG tablet    Sig: Take 2 tablets (5 mg total) by mouth daily.    Please change dose with next pill pack.  Patient will be fine until then.  Thank you   Olmesartan-amLODIPine -HCTZ 40-5-25 MG TABS    Sig: Take 1 tablet by mouth daily.    Dispense:  30 tablet    Refill:  5    STOP Amlodipine , Losartan, Chlorthalidone     Supervising Provider:   CHRISTOPHER, BRIDGETTE [[id number]]   cloNIDine (CATAPRES) 0.2 MG tablet    Sig: Take 1 tablet (0.2 mg  total) by mouth 2 (two) times daily.    Dispense:  60 tablet    Refill:  5    Reduced dose    Supervising Provider:   CHRISTOPHER, BRIDGETTE [[id number]]     Signed, Reche GORMAN Finder, NP  12/24/2023 4:44 PM    [facility]

## 2023-12-27 ENCOUNTER — Other Ambulatory Visit: Payer: Self-pay | Admitting: "Endocrinology

## 2023-12-27 ENCOUNTER — Other Ambulatory Visit (HOSPITAL_BASED_OUTPATIENT_CLINIC_OR_DEPARTMENT_OTHER): Payer: Self-pay | Admitting: Family

## 2023-12-29 NOTE — Telephone Encounter (Signed)
 Pt of Dr. Lonni last seen by Reche Finder NP. Does Dr. Lonni want to refill these Non-Cardiac RX's? Please advise.

## 2023-12-30 ENCOUNTER — Ambulatory Visit: Admitting: Urology

## 2023-12-30 ENCOUNTER — Ambulatory Visit (HOSPITAL_COMMUNITY)
Admission: RE | Admit: 2023-12-30 | Discharge: 2023-12-30 | Disposition: A | Discharge status: Home / Self Care | Source: Ambulatory Visit | Attending: Urology | Admitting: Urology

## 2023-12-30 VITALS — BP 109/69 | HR 92

## 2023-12-30 DIAGNOSIS — N5089 Other specified disorders of the male genital organs: Secondary | ICD-10-CM | POA: Diagnosis present

## 2023-12-30 DIAGNOSIS — N509 Disorder of male genital organs, unspecified: Secondary | ICD-10-CM | POA: Diagnosis not present

## 2023-12-30 NOTE — Progress Notes (Signed)
 12/30/2023 1:54 PM   Melvin Ray 12/27/1985 [id number]  Referring provider: Teresa Jenkins Jansky, FNP [address] [employer],  [place] [address]  Testis mass   HPI: Melvin Ray is a 38yo here for evaluation of a right testis mass. He noticed a right testis mass that has been growing in size fro 3 months. He has mild pain to palpation. No unexplained weight loss. No difficulty breathing. No significant LUTS  PMH: 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   SVT (supraventricular tachycardia)    Tobacco abuse     Surgical History: Past Surgical History:  Procedure Laterality Date   APPENDECTOMY     BIOPSY  01/07/2022   Procedure: BIOPSY;  Surgeon: [name], MD;  [facility];  Service: Gastroenterology;;   COLONOSCOPY WITH PROPOFOL  N/A 01/07/2022   Procedure: COLONOSCOPY WITH PROPOFOL ;  Surgeon: [name], MD;  [facility];  Service: Gastroenterology;  Laterality: N/A;  1030 ASA 3   ESOPHAGOGASTRODUODENOSCOPY  01/07/2022   Procedure: ESOPHAGOGASTRODUODENOSCOPY (EGD);  Surgeon: [name], Sieving, MD;  [facility];  Service: Gastroenterology;;   ESOPHAGOGASTRODUODENOSCOPY N/A 06/16/2023   Procedure: EGD (ESOPHAGOGASTRODUODENOSCOPY);  Surgeon: [name], Sieving, MD;  [facility];  Service: Gastroenterology;  Laterality: N/A;  2:30PM;ASA 3   ESOPHAGOGASTRODUODENOSCOPY N/A 09/15/2023   Procedure: EGD (ESOPHAGOGASTRODUODENOSCOPY);  Surgeon: [name], Sieving, MD;  [facility];  Service: Gastroenterology;  Laterality: N/A;  730am, asa 3   GIVENS CAPSULE STUDY N/A 02/05/2022   Procedure: GIVENS CAPSULE STUDY;  Surgeon: [name], MD;  [facility];   Service: Gastroenterology;  Laterality: N/A;  8:30am    Home Medications:  Allergies as of 12/30/2023   No Known Allergies      Medication List        Accurate as of December 30, 2023  1:54 PM. If you have any questions, ask your nurse or doctor.          Accu-Chek Guide Test test strip Generic drug: glucose blood 1 each by Other route 2 (two) times daily. Use as instructed   Accu-Chek Softclix Lancets lancets 3 (three) times daily.   albuterol 108 (90 Base) MCG/ACT inhaler Commonly known as: VENTOLIN HFA Inhale 1-2 puffs into the lungs every 6 (six) hours as needed for wheezing or shortness of breath.   cloNIDine 0.2 MG tablet Commonly known as: CATAPRES Take 1 tablet (0.2 mg total) by mouth 2 (two) times daily.   diltiazem 300 MG 24 hr capsule Commonly known as: CARDIZEM CD Take 300 mg by mouth daily.   empagliflozin  10 MG Tabs tablet Commonly known as: Jardiance  Take 1 tablet (10 mg total) by mouth daily before breakfast.   FeroSul 325 (65 Fe) MG tablet Generic drug: ferrous sulfate Take 325 mg by mouth daily.   FLUoxetine 20 MG capsule Commonly known as: PROZAC Take 20 mg by mouth daily.   Fluticasone-Salmeterol 250-50 MCG/DOSE Aepb Commonly known as: ADVAIR Inhale 1 puff into the lungs daily.   hydrALAZINE  100 MG tablet Commonly known as: APRESOLINE  Take 100 mg by mouth 2 (two) times daily.   metFORMIN  500 MG 24 hr tablet Commonly known as: GLUCOPHAGE -XR take 1 tablet (500 MILLIGRAM  total) by mouth daily with breakfast.   metoprolol 200 MG 24 hr tablet Commonly known as: TOPROL-XL Take 200 mg by mouth daily.   NON FORMULARY Pt uses a cpap nightly   Olmesartan-amLODIPine -HCTZ 40-5-25 MG Tabs Take 1 tablet by mouth daily.   Ozempic  (2 MG/DOSE) 8 MG/3ML Sopn Generic drug: Semaglutide  (2 MG/DOSE) Inject 2 mg into the skin once a week. Needs appointment for refills   pantoprazole  40 MG tablet Commonly known as: PROTONIX  Take 1 tablet (40 mg  total) by mouth 2 (two) times daily.   potassium chloride  10 MEQ tablet Commonly known as: KLOR-CON  Take 10 mEq by mouth daily.   terbinafine  250 MG tablet Commonly known as: LAMISIL  Take 1 tablet (250 mg total) by mouth daily.   traZODone 100 MG tablet Commonly known as: DESYREL Take 200 mg by mouth at bedtime.   Vitamin D3 50 MCG (2000 UT) Tabs Take 2,000 Units by mouth daily.        Allergies: No Known Allergies  Family History: Family History  Problem Relation Age of Onset   Heart failure Father    Hypertension Father    Diabetes Maternal Grandmother    Heart failure Paternal Grandmother        transplant    Social History:  reports that he has quit smoking. His smoking use included cigars. He has been exposed to tobacco smoke. He has quit using smokeless tobacco. He reports that he does not drink alcohol and does not use drugs.  ROS: All other review of systems were reviewed and are negative except what is noted above in HPI  Physical Exam: BP 109/69   Pulse 92   Constitutional:  Alert and oriented, No acute distress. HEENT: [place] AT, moist mucus membranes.  Trachea midline, no masses. Cardiovascular: No clubbing, cyanosis, or edema. Respiratory: Normal respiratory effort, no increased work of breathing. GI: Abdomen is soft, nontender, nondistended, no abdominal masses GU: No CVA tenderness. Circumcised phallus. No masses/lesions on penis, testis, 6cm right scrotal mass, mobile, nontedner to palpation.  Skin: No rashes, bruises or suspicious lesions. Neurologic: Grossly intact, no focal deficits, moving all 4 extremities. Psychiatric: Normal mood and affect.  Laboratory Data: Lab Results  Component Value Date   WBC 9.3 09/10/2023   HGB 13.3 09/10/2023   HCT 40.9 09/10/2023   MCV 85.4 09/10/2023   PLT 211 09/10/2023    Lab Results  Component Value Date   CREATININE 1.44 (H) 09/15/2023    No results found for: PSA  No results found for:  TESTOSTERONE  Lab Results  Component Value Date   HGBA1C 7.4 (A) 07/15/2023    Urinalysis No results found for: COLORURINE, APPEARANCEUR, LABSPEC, PHURINE, GLUCOSEU, HGBUR, BILIRUBINUR, KETONESUR, PROTEINUR, UROBILINOGEN, NITRITE, LEUKOCYTESUR  No results found for: LABMICR, WBCUA, RBCUA, LABEPIT, MUCUS, BACTERIA  Pertinent Imaging:  No results found for this or any previous visit.  No results found for this or any previous visit.  No results found for this or any previous visit.  No results found for this or any previous visit.  Results for orders placed during the hospital encounter of 03/01/21  US  RENAL  Narrative CLINICAL DATA:  Chronic kidney disease  EXAM: RENAL / URINARY TRACT ULTRASOUND COMPLETE  COMPARISON:  None.  FINDINGS: Right Kidney:  Renal measurements: 12.7 x 7.3 x 6.7 cm = volume: 326.4 mL. Echogenicity within normal limits. No mass or hydronephrosis visualized.  Left Kidney:  Renal measurements: 12.1 x 7.2 x 5.6 cm = volume: 254.5 mL.  Echogenicity within normal limits. No mass or hydronephrosis visualized.  Bladder:  Appears normal for degree of bladder distention.  Other:  Liver appears echogenic suggesting steatosis  IMPRESSION: 1. Normal ultrasound appearance of the kidneys 2. Liver appears echogenic suggesting hepatic steatosis   Electronically Signed By: Luke Bun M.D. On: 03/01/2021 20:31  No results found for this or any previous visit.  No results found for this or any previous visit.  No results found for this or any previous visit.   Assessment & Plan:    1. Testicular mass (Primary) Scrotal US , will call with results - Urinalysis, Routine w reflex microscopic   No follow-ups on file.  Belvie Clara, MD  [facility] [place]

## 2024-01-05 ENCOUNTER — Encounter: Payer: Self-pay | Admitting: Urology

## 2024-01-08 ENCOUNTER — Ambulatory Visit (HOSPITAL_BASED_OUTPATIENT_CLINIC_OR_DEPARTMENT_OTHER): Payer: Self-pay | Admitting: Family

## 2024-01-08 ENCOUNTER — Ambulatory Visit (HOSPITAL_COMMUNITY)
Admission: RE | Admit: 2024-01-08 | Discharge: 2024-01-08 | Disposition: A | Discharge status: Home / Self Care | Source: Ambulatory Visit | Attending: Family | Admitting: Family

## 2024-01-08 DIAGNOSIS — I1A0 Resistant hypertension: Secondary | ICD-10-CM | POA: Insufficient documentation

## 2024-01-16 LAB — COMPREHENSIVE METABOLIC PANEL WITH GFR
ALT: 22 IU/L (ref 0–44)
AST: 15 IU/L (ref 0–40)
Albumin: 4.1 g/dL (ref 4.1–5.1)
Alkaline Phosphatase: 77 IU/L (ref 47–123)
BUN/Creatinine Ratio: 9 (ref 9–20)
BUN: 10 mg/dL (ref 6–20)
Bilirubin Total: 0.4 mg/dL (ref 0.0–1.2)
CO2: 28 mmol/L (ref 20–29)
Calcium: 9.4 mg/dL (ref 8.7–10.2)
Chloride: 102 mmol/L (ref 96–106)
Creatinine, Ser: 1.14 mg/dL (ref 0.76–1.27)
Globulin, Total: 3.1 g/dL (ref 1.5–4.5)
Glucose: 144 mg/dL — ABNORMAL HIGH (ref 70–99)
Potassium: 3.6 mmol/L (ref 3.5–5.2)
Sodium: 145 mmol/L — ABNORMAL HIGH (ref 134–144)
Total Protein: 7.2 g/dL (ref 6.0–8.5)
eGFR: 85 mL/min/1.73 (ref >59)

## 2024-01-16 LAB — LIPID PANEL
Chol/HDL Ratio: 4.8 ratio (ref 0.0–5.0)
Cholesterol, Total: 176 mg/dL (ref 100–199)
HDL: 37 mg/dL — ABNORMAL LOW (ref >39)
LDL Chol Calc (NIH): 105 mg/dL — ABNORMAL HIGH (ref 0–99)
Triglycerides: 192 mg/dL — ABNORMAL HIGH (ref 0–149)
VLDL Cholesterol Cal: 34 mg/dL (ref 5–40)

## 2024-01-18 ENCOUNTER — Encounter: Payer: Self-pay | Admitting: "Endocrinology

## 2024-01-18 ENCOUNTER — Ambulatory Visit (INDEPENDENT_AMBULATORY_CARE_PROVIDER_SITE_OTHER): Admitting: "Endocrinology

## 2024-01-18 VITALS — BP 126/88 | HR 72 | Ht 72.0 in | Wt 383.0 lb

## 2024-01-18 DIAGNOSIS — Z794 Long term (current) use of insulin: Secondary | ICD-10-CM

## 2024-01-18 DIAGNOSIS — N182 Chronic kidney disease, stage 2 (mild): Secondary | ICD-10-CM

## 2024-01-18 DIAGNOSIS — F17200 Nicotine dependence, unspecified, uncomplicated: Secondary | ICD-10-CM

## 2024-01-18 DIAGNOSIS — E782 Mixed hyperlipidemia: Secondary | ICD-10-CM | POA: Diagnosis not present

## 2024-01-18 DIAGNOSIS — E1122 Type 2 diabetes mellitus with diabetic chronic kidney disease: Secondary | ICD-10-CM

## 2024-01-18 DIAGNOSIS — I10 Essential (primary) hypertension: Secondary | ICD-10-CM

## 2024-01-18 LAB — POCT GLYCOSYLATED HEMOGLOBIN (HGB A1C)

## 2024-01-18 LAB — HEMOGLOBIN A1C: Hemoglobin A1C: 7.3

## 2024-01-18 MED ORDER — EMPAGLIFLOZIN 25 MG PO TABS
25.0000 mg | ORAL_TABLET | Freq: Every day | ORAL | 1 refills | Status: AC
Start: 2024-01-18 — End: ?

## 2024-01-18 NOTE — Patient Instructions (Signed)

## 2024-01-18 NOTE — Progress Notes (Signed)
 01/18/2024, 12:26 PM  Endocrinology follow-up note   Subjective:    Patient ID: Melvin Ray, male    DOB: [birth year].  Melvin Ray is being seen in follow up after he was sen in consultation for management of currently uncontrolled symptomatic diabetes requested by  Vicky Berg, MD. He is assisted and accompanied by his case manager Johnny.   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   SVT (supraventricular tachycardia)    Tobacco abuse     Past Surgical History:  Procedure Laterality Date   APPENDECTOMY     BIOPSY  01/07/2022   Procedure: BIOPSY;  Surgeon: [name], MD;  [facility];  Service: Gastroenterology;;   COLONOSCOPY WITH PROPOFOL  N/A 01/07/2022   Procedure: COLONOSCOPY WITH PROPOFOL ;  Surgeon: [name], MD;  [facility];  Service: Gastroenterology;  Laterality: N/A;  1030 ASA 3   ESOPHAGOGASTRODUODENOSCOPY  01/07/2022   Procedure: ESOPHAGOGASTRODUODENOSCOPY (EGD);  Surgeon: [name], Sieving, MD;  [facility]: [facility];  Service: Gastroenterology;;   ESOPHAGOGASTRODUODENOSCOPY N/A 06/16/2023   Procedure: EGD (ESOPHAGOGASTRODUODENOSCOPY);  Surgeon: [name], Sieving, MD;  [facility];  Service: Gastroenterology;  Laterality: N/A;  2:30PM;ASA 3   ESOPHAGOGASTRODUODENOSCOPY N/A 09/15/2023   Procedure: EGD (ESOPHAGOGASTRODUODENOSCOPY);  Surgeon: [name], Sieving, MD;  [facility];  Service: Gastroenterology;  Laterality: N/A;  730am, asa 3   GIVENS CAPSULE STUDY N/A 02/05/2022   Procedure: GIVENS CAPSULE STUDY;  Surgeon: [name], MD;  [facility];  Service: Gastroenterology;  Laterality: N/A;  8:30am    Social History   Socioeconomic History   Marital status: Single    Spouse name: Not on file   Number of children: Not on file   Years of education: Not on file   Highest education level: Not on file  Occupational History   Not on file  Tobacco Use   Smoking status: Former    Types: Cigars    Passive exposure: Current   Smokeless tobacco: Former  Building Services Engineer status: Former  Substance and Sexual Activity   Alcohol use: Never   Drug use: Never   Sexual activity: Not on file  Other Topics Concern   Not on file  Social History Narrative   Not on file   Social Drivers of Health   Financial Resource Strain: Medium Risk (03/24/2022)   Overall Financial Resource Strain (CARDIA)    Difficulty of Paying Living Expenses: Somewhat hard  Food Insecurity: No Food Insecurity (03/24/2022)   Hunger Vital Sign    Worried About Running Out of Food in the Last Year: Never true    Ran Out of Food in the Last Year: Never true  Transportation Needs: No Transportation Needs (03/24/2022)   PRAPARE - Administrator, Civil Service (Medical): No  Lack of Transportation (Non-Medical): No  Physical Activity: Insufficiently Active (07/31/2020)   Exercise Vital Sign    Days of Exercise per Week: 3 days    Minutes of Exercise per Session: 40 min  Stress: Not on file  Social Connections: Not on file    Family History  Problem Relation Age of Onset   Heart failure Father    Hypertension Father    Diabetes Maternal Grandmother    Heart failure Paternal Grandmother        transplant    Outpatient Encounter Medications as of 01/18/2024  Medication Sig   Accu-Chek Softclix Lancets lancets 3 (three) times daily.   albuterol (VENTOLIN HFA) 108 (90 Base) MCG/ACT inhaler Inhale 1-2 puffs into the lungs every 6 (six) hours as needed for wheezing or shortness of breath.   Cholecalciferol  (VITAMIN D3) 50 MCG (2000  UT) TABS Take 2,000 Units by mouth daily.   cloNIDine  (CATAPRES ) 0.2 MG tablet Take 1 tablet (0.2 mg total) by mouth 2 (two) times daily.   diltiazem (CARDIZEM CD) 300 MG 24 hr capsule Take 300 mg by mouth daily.   empagliflozin  (JARDIANCE ) 25 MG TABS tablet Take 1 tablet (25 mg total) by mouth daily before breakfast.   FEROSUL 325 (65 Fe) MG tablet Take 325 mg by mouth daily.   FLUoxetine (PROZAC) 20 MG capsule Take 20 mg by mouth daily.   Fluticasone-Salmeterol (ADVAIR) 250-50 MCG/DOSE AEPB Inhale 1 puff into the lungs daily.   glucose blood (ACCU-CHEK GUIDE TEST) test strip 1 each by Other route 2 (two) times daily. Use as instructed   hydrALAZINE  (APRESOLINE ) 100 MG tablet Take 100 mg by mouth 2 (two) times daily.   metFORMIN  (GLUCOPHAGE -XR) 500 MG 24 hr tablet take 1 tablet (500 MILLIGRAM total) by mouth daily with breakfast.   metoprolol (TOPROL-XL) 200 MG 24 hr tablet Take 200 mg by mouth daily.   NON FORMULARY Pt uses a cpap nightly   Olmesartan -amLODIPine -HCTZ 40-5-25 MG TABS Take 1 tablet by mouth daily.   pantoprazole  (PROTONIX ) 40 MG tablet Take 1 tablet (40 mg total) by mouth 2 (two) times daily.   potassium chloride  (KLOR-CON ) 10 MEQ tablet Take 10 mEq by mouth daily.   Semaglutide , 2 MG/DOSE, (OZEMPIC , 2 MG/DOSE,) 8 MG/3ML SOPN Inject 2 mg into the skin once a week. Needs appointment for refills   terbinafine  (LAMISIL ) 250 MG tablet Take 1 tablet (250 mg total) by mouth daily. (Patient not taking: Reported on 01/18/2024)   traZODone (DESYREL) 100 MG tablet Take 200 mg by mouth at bedtime.    [DISCONTINUED] empagliflozin  (JARDIANCE ) 10 MG TABS tablet Take 1 tablet (10 mg total) by mouth daily before breakfast.   No facility-administered encounter medications on file as of 01/18/2024.    ALLERGIES: No Known Allergies  VACCINATION STATUS: Immunization History  Administered Date(s) Administered   Influenza Split 02/15/2019   Tdap 07/16/2015    Diabetes He presents for his  follow-up diabetic visit. He has type 2 diabetes mellitus. Onset time: He was diagnosed through approximate age of 31 years. His disease course has been improving. There are no hypoglycemic associated symptoms. Pertinent negatives for hypoglycemia include no headaches, seizures or tremors. Pertinent negatives for diabetes include no blurred vision, no chest pain, no polydipsia, no polyuria and no weight loss. There are no hypoglycemic complications. Symptoms are improving. Diabetic complications include nephropathy. Risk factors for coronary artery disease include dyslipidemia, diabetes mellitus, family history, male sex, obesity, hypertension, tobacco exposure and sedentary lifestyle. Current diabetic treatment  includes insulin  injections. His weight is decreasing steadily. He is following a generally unhealthy diet. When asked about meal planning, he reported none. He has not had a previous visit with a dietitian. He never participates in exercise. His home blood glucose trend is decreasing steadily. Melvin Ray presents without any meter nor logs.  - He presents in a wheelchair overall 20 pounds of weight loss.  He is tolerating his current medications.  He is accompanied by his case production designer, theatre/television/film.   His point-of-care A1c is 7.3% .  He does not report hypoglycemia.    ) An ACE inhibitor/angiotensin II receptor blocker is being taken. He does not see a podiatrist.Eye exam is not current.  Hyperlipidemia This is a chronic problem. The current episode started more than 1 year ago. Exacerbating diseases include chronic renal disease, diabetes and obesity. Pertinent negatives include no chest pain, myalgias or shortness of breath. Risk factors for coronary artery disease include dyslipidemia, diabetes mellitus, family history, obesity, male sex, hypertension and a sedentary lifestyle.  Hypertension This is a chronic problem. The current episode started more than 1 year ago. Pertinent negatives include no blurred vision,  chest pain, headaches, palpitations or shortness of breath. Risk factors for coronary artery disease include dyslipidemia, diabetes mellitus, family history, obesity, male gender, sedentary lifestyle and smoking/tobacco exposure. Past treatments include central alpha agonists and angiotensin blockers. Identifiable causes of hypertension include chronic renal disease.     Objective:       01/18/2024   10:49 AM 12/30/2023    1:43 PM 12/24/2023    1:54 PM  Vitals with BMI  Height 6' 0  6' 0  Weight 383 lbs  374 lbs 14 oz  BMI 51.93  50.83  Systolic 126 109 889  Diastolic 88 69 70  Pulse 72 92 91    BP 126/88   Pulse 72   Ht 6' (1.829 m)   Wt (!) 383 lb (173.7 kg)   BMI 51.94 kg/m   Wt Readings from Last 3 Encounters:  01/18/24 (!) 383 lb (173.7 kg)  12/24/23 (!) 374 lb 14.4 oz (170.1 kg)  09/15/23 (!) 362 lb 15.8 oz (164.7 kg)       Diabetic Labs (most recent): Lab Results  Component Value Date   HGBA1C 7.4 (A) 07/15/2023   HGBA1C 6.6 03/17/2023   HGBA1C 7.4 (A) 06/25/2022   MICROALBUR 7.8 07/23/2021      Recent Results (from the past 2160 hours)  Comprehensive metabolic panel with GFR     Status: Abnormal   Collection Time: 01/15/24  2:23 PM  Result Value Ref Range   Glucose 144 (H) 70 - 99 mg/dL   BUN 10 6 - 20 mg/dL   Creatinine, Ser 8.85 0.76 - 1.27 mg/dL   eGFR 85 >40 fO/fpw/8.26   BUN/Creatinine Ratio 9 9 - 20   Sodium 145 (H) 134 - 144 mmol/L   Potassium 3.6 3.5 - 5.2 mmol/L   Chloride 102 96 - 106 mmol/L   CO2 28 20 - 29 mmol/L   Calcium 9.4 8.7 - 10.2 mg/dL   Total Protein 7.2 6.0 - 8.5 g/dL   Albumin 4.1 4.1 - 5.1 g/dL   Globulin, Total 3.1 1.5 - 4.5 g/dL   Bilirubin Total 0.4 0.0 - 1.2 mg/dL   Alkaline Phosphatase 77 47 - 123 IU/L   AST 15 0 - 40 IU/L   ALT 22 0 - 44 IU/L  Lipid panel     Status: Abnormal   Collection Time:  01/15/24  2:23 PM  Result Value Ref Range   Cholesterol, Total 176 100 - 199 mg/dL   Triglycerides 807 (H) 0 - 149  mg/dL   HDL 37 (L) >60 mg/dL   VLDL Cholesterol Cal 34 5 - 40 mg/dL   LDL Chol Calc (NIH) 894 (H) 0 - 99 mg/dL   Chol/HDL Ratio 4.8 0.0 - 5.0 ratio    Comment:                                   T. Chol/HDL Ratio                                             Men  Women                               1/2 Avg.Risk  3.4    3.3                                   Avg.Risk  5.0    4.4                                2X Avg.Risk  9.6    7.1                                3X Avg.Risk 23.4   11.0    Lipid Panel     Component Value Date/Time   CHOL 176 01/15/2024 1423   TRIG 192 (H) 01/15/2024 1423   HDL 37 (L) 01/15/2024 1423   CHOLHDL 4.8 01/15/2024 1423   LDLCALC 105 (H) 01/15/2024 1423   LABVLDL 34 01/15/2024 1423     Assessment & Plan:   1. Type 2 diabetes mellitus with stage 2 chronic kidney disease, with long-term current use of insulin  (HCC)   - Welborn Keena has currently uncontrolled symptomatic type 2 DM since  38 years of age. Recent labs reviewed.  [place] presents without any meter nor logs.  - He presents in a wheelchair overall 20 pounds of weight loss.  He is tolerating his current medications.  He is accompanied by his case production designer, theatre/television/film.   His point-of-care A1c is 7.3% .  He does not report hypoglycemia.     - I had a long discussion with him about the progressive nature of diabetes and the pathology behind its complications. -his diabetes is complicated by CKD, obesity/sedentary life, smoking and he remains at a high risk for more acute and chronic complications which include CAD, CVA, CKD, retinopathy, and neuropathy. These are all discussed in detail with him.  - I have counseled him on diet  and weight management  by adopting a carbohydrate restricted/protein rich diet. Patient is encouraged to switch to  unprocessed or minimally processed     complex starch and increased protein intake (animal or plant source), fruits, and vegetables. -  he is advised to stick to a routine  mealtimes to eat 3 meals  a day and avoid unnecessary snacks ( to snack only to correct hypoglycemia).  -  he acknowledges that there is a room for improvement in his food and drink choices. - Suggestion is made for him to avoid simple carbohydrates  from his diet including Cakes, Sweet Desserts, Ice Cream, Soda (diet and regular), Sweet Tea, Candies, Chips, Cookies, Store Bought Juices, Alcohol , Artificial Sweeteners,  Coffee Creamer, and Sugar-free Products, Lemonade. This will help patient to have more stable blood glucose profile and potentially avoid unintended weight gain.    - he has been  scheduled with Penny Crumpton, RDN, CDE for diabetes education.  - I have approached him with the following individualized plan to manage  his diabetes and patient agrees:  Last visit presentation with near target glycemic profile evident from A1c of 7.3%, he will not need insulin  treatment for now. He is advised to continue Ozempic  2 mg subcutaneously weekly, discussed and lowered his metformin  to 500 mg XR p.o. daily at breakfast.    -He will continue to benefit from Jardiance , discussed and increased his dose to 25 mg p.o. daily at breakfast.  Side effects and precautions discussed with him.  He is particularly urged to keep good Personal hygiene to avoid genital rash and urinary tract infections.    He is encouraged to monitor blood glucose twice a day-daily before breakfast and at bedtime.     he is encouraged to call clinic for blood glucose levels less than 70 or above 200 mg /dl.  -He is counseled for  smoking cessation, he is too high risk to's give incretin therapy due to his risk of pancreatitis.  Tragically, patient continues to smoke.  He has obstructive sleep apnea would benefit the most from weight loss and smoking cessation. The patient was counseled on the dangers of tobacco use, and was advised to quit.  Reviewed strategies to maximize success, including removing cigarettes and  smoking materials from environment.  - Specific targets for  A1c;  LDL, HDL,  and Triglycerides were discussed with the patient.  2) Blood Pressure /Hypertension:  His blood pressure is controlled to target.  He typically does not take his blood pressure medications in the morning.       he is advised to continue his current medications including  chlorthalidone  25 mg p.o. daily, clonidine  0.1 mg p.o. twice daily, hydralazine  50 mg p.o. 3 times daily, Losartan 100 mg p.o. every morning, metoprolol 200 mg p.o. daily.   Admittedly, he consumes salty food, advised to limit salt consumption.   3) Lipids/Hyperlipidemia: Recent lipid panel showed LDL still high at 105.  He wishes to  avoid statin medications at this time.  He will be considered for repeat fasting lipid panel and will be reapproached for low-dose statin initiation on subsequent visits.    4)  Weight/Diet:  Body mass index is 51.94 kg/m.  -He has achieved 15+ pounds of weight loss since last visit.    he is  a candidate for modest weight loss. I discussed with him the fact that loss of 5 - 10% of his  current body weight will have the most impact on his diabetes management.  Exercise, and detailed carbohydrates information provided  -  detailed on discharge instructions.  5) Chronic Care/Health Maintenance:  -he  is on ARB medications and  is encouraged to initiate and continue to follow up with Ophthalmology, Dentist,  Podiatrist at least yearly or according to recommendations, and advised to  quit smoking. I have recommended yearly flu vaccine and pneumonia vaccine at least every 5 years; moderate intensity  exercise for up to 150 minutes weekly; and  sleep for at least 7 hours a day.  - he is  advised to maintain close follow up with Vicky Berg, MD for primary care needs, as well as his other providers for optimal and coordinated care.   I spent  26  minutes in the care of the patient today including review of labs from [facility],  Lipids, Thyroid Function, Hematology (current and previous including abstractions from other facilities); face-to-face time discussing  his blood glucose readings/logs, discussing hypoglycemia and hyperglycemia episodes and symptoms, medications doses, his options of short and long term treatment based on the latest standards of care / guidelines;  discussion about incorporating lifestyle medicine;  and documenting the encounter. Risk reduction counseling performed per USPSTF guidelines to reduce  obesity and cardiovascular risk factors.     Please refer to Patient Instructions for Blood Glucose Monitoring and Insulin /Medications Dosing Guide  in media tab for additional information. Please  also refer to  Patient Self Inventory in the Media  tab for reviewed elements of pertinent patient history.  Melvin Ray participated in the discussions, expressed understanding, and voiced agreement with the above plans.  All questions were answered to his satisfaction. he is encouraged to contact clinic should he have any questions or concerns prior to his return visit.   Follow up plan: - Return in about 4 months (around 05/17/2024) for Bring Meter/CGM Device/Logs- A1c in Office.  Ranny Earl, MD [facility] [address] [place], [place] [address] Phone: [phone]  Fax: [phone]    01/18/2024, 12:26 PM  This note was partially dictated with voice recognition software. Similar sounding words can be transcribed inadequately or may not  be corrected upon review.

## 2024-02-24 ENCOUNTER — Encounter (HOSPITAL_COMMUNITY)
Admission: RE | Admit: 2024-02-24 | Discharge: 2024-02-24 | Disposition: A | Discharge status: Home / Self Care | Source: Ambulatory Visit | Attending: Urology | Admitting: Urology

## 2024-02-24 NOTE — Pre-Procedure Instructions (Signed)
 Attempted pre-op phone call. Left VM for Garen Hoard, [employer] representative to call us  back. I asked him on the message to make sure patient has stopped Ozempic  and His last dose of Jardiance  should be on tomorrow 02/25/2024.

## 2024-02-26 NOTE — Pre-Procedure Instructions (Signed)
 Spoke with Melvin Ray, [employer] representative. He will be bringing patient. He states that Mr Dull said that he never received the semaglutide  from the pharmacy, so he has not taken that. He also states that the pharmacy called and told Mr gutridge to stop his jardiance .

## 2024-02-26 NOTE — Pre-Procedure Instructions (Signed)
 Attempted pre-op phone call to Melvin Ray, [employer]. Left VM for him to call us  back.

## 2024-02-29 ENCOUNTER — Encounter (HOSPITAL_COMMUNITY): Payer: Self-pay | Admitting: Urology

## 2024-02-29 ENCOUNTER — Ambulatory Visit (HOSPITAL_COMMUNITY): Admitting: Anesthesiology

## 2024-02-29 ENCOUNTER — Ambulatory Visit (HOSPITAL_COMMUNITY)
Admission: RE | Admit: 2024-02-29 | Discharge: 2024-02-29 | Disposition: A | Discharge status: Home / Self Care | Attending: Urology | Admitting: Urology

## 2024-02-29 ENCOUNTER — Encounter (HOSPITAL_COMMUNITY)
Admission: RE | Disposition: A | Discharge status: Home / Self Care | Payer: Self-pay | Source: Home / Self Care | Attending: Urology

## 2024-02-29 DIAGNOSIS — L720 Epidermal cyst: Secondary | ICD-10-CM | POA: Diagnosis not present

## 2024-02-29 DIAGNOSIS — Z79899 Other long term (current) drug therapy: Secondary | ICD-10-CM | POA: Diagnosis not present

## 2024-02-29 DIAGNOSIS — Z87891 Personal history of nicotine dependence: Secondary | ICD-10-CM | POA: Insufficient documentation

## 2024-02-29 DIAGNOSIS — Z6841 Body Mass Index (BMI) 40.0 and over, adult: Secondary | ICD-10-CM | POA: Diagnosis not present

## 2024-02-29 DIAGNOSIS — Z833 Family history of diabetes mellitus: Secondary | ICD-10-CM | POA: Insufficient documentation

## 2024-02-29 DIAGNOSIS — Z7985 Long-term (current) use of injectable non-insulin antidiabetic drugs: Secondary | ICD-10-CM | POA: Diagnosis not present

## 2024-02-29 DIAGNOSIS — G4733 Obstructive sleep apnea (adult) (pediatric): Secondary | ICD-10-CM | POA: Insufficient documentation

## 2024-02-29 DIAGNOSIS — Z7951 Long term (current) use of inhaled steroids: Secondary | ICD-10-CM | POA: Insufficient documentation

## 2024-02-29 DIAGNOSIS — N189 Chronic kidney disease, unspecified: Secondary | ICD-10-CM | POA: Insufficient documentation

## 2024-02-29 DIAGNOSIS — N5089 Other specified disorders of the male genital organs: Secondary | ICD-10-CM | POA: Insufficient documentation

## 2024-02-29 DIAGNOSIS — I509 Heart failure, unspecified: Secondary | ICD-10-CM | POA: Insufficient documentation

## 2024-02-29 DIAGNOSIS — I130 Hypertensive heart and chronic kidney disease with heart failure and stage 1 through stage 4 chronic kidney disease, or unspecified chronic kidney disease: Secondary | ICD-10-CM | POA: Diagnosis not present

## 2024-02-29 DIAGNOSIS — E1122 Type 2 diabetes mellitus with diabetic chronic kidney disease: Secondary | ICD-10-CM | POA: Diagnosis not present

## 2024-02-29 DIAGNOSIS — Z7984 Long term (current) use of oral hypoglycemic drugs: Secondary | ICD-10-CM | POA: Diagnosis not present

## 2024-02-29 HISTORY — PX: SCROTECTOMY: SHX6207

## 2024-02-29 HISTORY — PX: SCROTAL EXPLORATION: SHX2386

## 2024-02-29 LAB — GLUCOSE, CAPILLARY
Glucose-Capillary: 139 mg/dL — ABNORMAL HIGH (ref 70–99)
Glucose-Capillary: 108 mg/dL — ABNORMAL HIGH (ref 70–99)

## 2024-02-29 SURGERY — EXPLORATION, SCROTUM
Anesthesia: Monitor Anesthesia Care | Site: Scrotum | Laterality: Right

## 2024-02-29 MED ORDER — HYDROMORPHONE HCL 1 MG/ML IJ SOLN
INTRAMUSCULAR | Status: AC
  Filled 2024-02-29: qty 0.5

## 2024-02-29 MED ORDER — 0.9 % SODIUM CHLORIDE (POUR BTL) OPTIME
TOPICAL | Status: DC | PRN
  Administered 2024-02-29: 1000 mL

## 2024-02-29 MED ORDER — LIDOCAINE 2% (20 MG/ML) 5 ML SYRINGE
INTRAMUSCULAR | Status: DC | PRN
  Administered 2024-02-29: 100 mg via INTRAVENOUS

## 2024-02-29 MED ORDER — LACTATED RINGERS IV SOLN: INTRAVENOUS | Status: DC

## 2024-02-29 MED ORDER — ROCURONIUM BROMIDE 10 MG/ML (PF) SYRINGE
PREFILLED_SYRINGE | INTRAVENOUS | Status: DC | PRN
  Administered 2024-02-29: 10 mg via INTRAVENOUS
  Administered 2024-02-29: 20 mg via INTRAVENOUS
  Administered 2024-02-29: 40 mg via INTRAVENOUS

## 2024-02-29 MED ORDER — MIDAZOLAM HCL 2 MG/2ML IJ SOLN
INTRAMUSCULAR | Status: AC
  Filled 2024-02-29: qty 2

## 2024-02-29 MED ORDER — CEFAZOLIN SODIUM-DEXTROSE 3-4 GM/150ML-% IV SOLN
3.0000 g | INTRAVENOUS | Status: AC
  Administered 2024-02-29: 3 g via INTRAVENOUS
  Filled 2024-02-29: qty 150

## 2024-02-29 MED ORDER — SUGAMMADEX SODIUM 200 MG/2ML IV SOLN
INTRAVENOUS | Status: AC
  Filled 2024-02-29: qty 4

## 2024-02-29 MED ORDER — ONDANSETRON HCL 4 MG/2ML IJ SOLN
INTRAMUSCULAR | Status: AC
  Filled 2024-02-29: qty 2

## 2024-02-29 MED ORDER — FENTANYL CITRATE (PF) 100 MCG/2ML IJ SOLN
INTRAMUSCULAR | Status: AC
  Filled 2024-02-29: qty 2

## 2024-02-29 MED ORDER — OXYCODONE-ACETAMINOPHEN 5-325 MG PO TABS: 1.0000 | ORAL_TABLET | ORAL | 0 refills | Status: AC | PRN

## 2024-02-29 MED ORDER — PROPOFOL 500 MG/50ML IV EMUL
INTRAVENOUS | Status: AC
  Filled 2024-02-29: qty 50

## 2024-02-29 MED ORDER — PROPOFOL 500 MG/50ML IV EMUL
INTRAVENOUS | Status: DC | PRN
  Administered 2024-02-29: 200 mg via INTRAVENOUS

## 2024-02-29 MED ORDER — OXYCODONE HCL 5 MG PO TABS: 5.0000 mg | ORAL_TABLET | Freq: Once | ORAL | Status: DC | PRN

## 2024-02-29 MED ORDER — ORAL CARE MOUTH RINSE: 15.0000 mL | Freq: Once | OROMUCOSAL | Status: AC

## 2024-02-29 MED ORDER — LIDOCAINE 2% (20 MG/ML) 5 ML SYRINGE
INTRAMUSCULAR | Status: AC
  Filled 2024-02-29: qty 5

## 2024-02-29 MED ORDER — FENTANYL CITRATE (PF) 50 MCG/ML IJ SOSY: 25.0000 ug | PREFILLED_SYRINGE | INTRAMUSCULAR | Status: DC | PRN

## 2024-02-29 MED ORDER — ONDANSETRON HCL 4 MG/2ML IJ SOLN: 4.0000 mg | Freq: Once | INTRAMUSCULAR | Status: DC | PRN

## 2024-02-29 MED ORDER — ARTIFICIAL TEARS OPHTHALMIC OINT
TOPICAL_OINTMENT | OPHTHALMIC | Status: AC
  Filled 2024-02-29: qty 3.5

## 2024-02-29 MED ORDER — FENTANYL CITRATE (PF) 100 MCG/2ML IJ SOLN
INTRAMUSCULAR | Status: DC | PRN
  Administered 2024-02-29 (×2): 50 ug via INTRAVENOUS

## 2024-02-29 MED ORDER — SUCCINYLCHOLINE CHLORIDE 200 MG/10ML IV SOSY
PREFILLED_SYRINGE | INTRAVENOUS | Status: DC | PRN
  Administered 2024-02-29: 160 mg via INTRAVENOUS

## 2024-02-29 MED ORDER — DEXAMETHASONE SOD PHOSPHATE PF 10 MG/ML IJ SOLN
INTRAMUSCULAR | Status: DC | PRN
  Administered 2024-02-29: 10 mg via INTRAVENOUS

## 2024-02-29 MED ORDER — SUGAMMADEX SODIUM 200 MG/2ML IV SOLN
INTRAVENOUS | Status: DC | PRN
  Administered 2024-02-29: 400 mg via INTRAVENOUS

## 2024-02-29 MED ORDER — BUPIVACAINE HCL (PF) 0.25 % IJ SOLN
INTRAMUSCULAR | Status: AC
  Filled 2024-02-29: qty 30

## 2024-02-29 MED ORDER — ONDANSETRON HCL 4 MG/2ML IJ SOLN
INTRAMUSCULAR | Status: DC | PRN
  Administered 2024-02-29: 4 mg via INTRAVENOUS

## 2024-02-29 MED ORDER — HYDROMORPHONE HCL 1 MG/ML IJ SOLN
INTRAMUSCULAR | Status: DC | PRN
  Administered 2024-02-29: .5 mg via INTRAVENOUS

## 2024-02-29 MED ORDER — CHLORHEXIDINE GLUCONATE 0.12 % MT SOLN
15.0000 mL | Freq: Once | OROMUCOSAL | Status: AC
  Administered 2024-02-29: 15 mL via OROMUCOSAL
  Filled 2024-02-29: qty 15

## 2024-02-29 MED ORDER — OXYCODONE HCL 5 MG/5ML PO SOLN: 5.0000 mg | Freq: Once | ORAL | Status: DC | PRN

## 2024-02-29 MED ORDER — BUPIVACAINE HCL 0.25 % IJ SOLN
INTRAMUSCULAR | Status: DC | PRN
  Administered 2024-02-29: 10 mL

## 2024-02-29 MED ORDER — MIDAZOLAM HCL (PF) 2 MG/2ML IJ SOLN
INTRAMUSCULAR | Status: DC | PRN
  Administered 2024-02-29: 2 mg via INTRAVENOUS

## 2024-02-29 SURGICAL SUPPLY — 24 items
BNDG GAUZE DERMACEA FLUFF 4 (GAUZE/BANDAGES/DRESSINGS) IMPLANT
COVER LIGHT HANDLE STERIS (MISCELLANEOUS) ×2 IMPLANT
DERMABOND ADVANCED .7 DNX12 (GAUZE/BANDAGES/DRESSINGS) IMPLANT
DRAIN PENROSE 0.5X18 (DRAIN) IMPLANT
ELECTRODE REM PT RTRN 9FT ADLT (ELECTROSURGICAL) ×1 IMPLANT
GAUZE SPONGE 4X4 12PLY STRL (GAUZE/BANDAGES/DRESSINGS) ×1 IMPLANT
GLOVE BIO SURGEON STRL SZ8 (GLOVE) ×1 IMPLANT
GLOVE BIOGEL PI IND STRL 7.0 (GLOVE) ×2 IMPLANT
GLOVE BIOGEL PI IND STRL 8 (GLOVE) ×1 IMPLANT
GOWN STRL REUS W/TWL LRG LVL3 (GOWN DISPOSABLE) ×1 IMPLANT
GOWN STRL REUS W/TWL XL LVL3 (GOWN DISPOSABLE) ×1 IMPLANT
KIT TURNOVER KIT A (KITS) ×1 IMPLANT
MANIFOLD NEPTUNE II (INSTRUMENTS) ×1 IMPLANT
NEEDLE HYPO 25X1 1.5 SAFETY (NEEDLE) ×1 IMPLANT
PACK MINOR (CUSTOM PROCEDURE TRAY) ×1 IMPLANT
PAD ARMBOARD POSITIONER FOAM (MISCELLANEOUS) ×1 IMPLANT
POSITIONER HEAD 8X9X4 ADT (SOFTGOODS) ×1 IMPLANT
SET BASIN LINEN APH (SET/KITS/TRAYS/PACK) ×1 IMPLANT
SOL PREP POV-IOD 4OZ 10% (MISCELLANEOUS) ×1 IMPLANT
SOLUTION SCRB POV-IOD 4OZ 7.5% (MISCELLANEOUS) ×1 IMPLANT
SUPPORTER AHLETIC TETRA LG (SOFTGOODS) ×1 IMPLANT
SUT MNCRL AB 4-0 PS2 18 (SUTURE) IMPLANT
SUT VIC AB 3-0 SH 27X BRD (SUTURE) IMPLANT
SYR CONTROL 10ML LL (SYRINGE) ×1 IMPLANT

## 2024-02-29 NOTE — Anesthesia Preprocedure Evaluation (Signed)
"                                    Anesthesia Evaluation  Patient identified by MRN, date of birth, ID band Patient awake    Reviewed: Allergy & Precautions, H&P , NPO status , Patient's Chart, lab work & pertinent test results, reviewed documented beta blocker date and time   Airway Mallampati: II  TM Distance: >3 FB Neck ROM: full    Dental no notable dental hx.    Pulmonary sleep apnea , former smoker   Pulmonary exam normal breath sounds clear to auscultation       Cardiovascular Exercise Tolerance: Good hypertension, +CHF   Rhythm:regular Rate:Normal     Neuro/Psych  PSYCHIATRIC DISORDERS  Depression    negative neurological ROS     GI/Hepatic negative GI ROS, Neg liver ROS,,,  Endo/Other  diabetes    Renal/GU Renal disease  negative genitourinary   Musculoskeletal   Abdominal   Peds  Hematology  (+) Blood dyscrasia, anemia   Anesthesia Other Findings   Reproductive/Obstetrics negative OB ROS                              Anesthesia Physical Anesthesia Plan  ASA: 3  Anesthesia Plan: MAC   Post-op Pain Management:    Induction:   PONV Risk Score and Plan: Propofol  infusion  Airway Management Planned:   Additional Equipment:   Intra-op Plan:   Post-operative Plan:   Informed Consent: I have reviewed the patients History and Physical, chart, labs and discussed the procedure including the risks, benefits and alternatives for the proposed anesthesia with the patient or authorized representative who has indicated his/her understanding and acceptance.     Dental Advisory Given  Plan Discussed with: CRNA  Anesthesia Plan Comments:         Anesthesia Quick Evaluation  "

## 2024-02-29 NOTE — Op Note (Addendum)
 Preoperative diagnosis: Right paratesticular mass  Postoperative diagnosis: Same  Procedure: 1. Excision of right paratesticular mass  Attending: Belvie Clara, MD  Anesthesia: General  History of blood loss: Minimal  Antibiotics: ancef   Drains: none  Specimens: 1. 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  We discussed the treatment options including observation versus excision after discussing treatment options he proceed with excision.   Procedure in detail: Prior to procedure consent was obtained.  Patient was brought to the operating room and a brief timeout was done to ensure correct patient, correct procedure, correct site.  General anesthesia was administered and patient was placed in supine position.  His genitalia was then prepped and draped in usual sterile fashion.  A 5cm incision was made in the right hemiscrotum.  We dissected down to the mass and using sharp dissection the mas was removed. Hemostasis was then obtained with electrocautery. We then closed the overlying dartos with 3-0 vicryl in a running fashion. The skin was then closed with 4-0 monocryl in a running fashion. Dermabond was placed on the incision.  A dressing was then applied to the incision.  We then placed a scrotal fluff and this then concluded the procedure which was well tolerated by the patient.  Complications: None  Condition: Stable, extubated, transferred to PACU.  Plan: Patient is to be discharged home.  He is to follow up in 2 weeks for wound check and pathology discussion

## 2024-02-29 NOTE — OR Nursing (Signed)
 Unable to get scrotal support or mesh underwear to fit patient. Dr Sherrilee notified and we will sen 4X4 home with him to place in his underwear. Patient and caregiver Garen Hoard, verbalize understanding.

## 2024-02-29 NOTE — Transfer of Care (Signed)
 Immediate Anesthesia Transfer of Care Note  Patient: Melvin Ray  Procedure(s) Performed: EXPLORATION, SCROTUM (Right: Scrotum) EXCISION, SCROTUM (Right: Scrotum)  Patient Location: PACU  Anesthesia Type:General  Level of Consciousness: drowsy and patient cooperative  Airway & Oxygen Therapy: Patient Spontanous Breathing and Patient connected to face mask oxygen  Post-op Assessment: Report given to RN and Post -op Vital signs reviewed and stable  Post vital signs: Reviewed and stable  Last Vitals:  Vitals Value Taken Time  BP 167/84 02/29/24 12:36  Temp    Pulse    Resp 25 02/29/24 12:38  SpO2    Vitals shown include unfiled device data.  Last Pain:  Vitals:   02/29/24 0944  TempSrc: Oral  PainSc: 0-No pain      Patients Stated Pain Goal: 7 (02/29/24 0944)  Complications: No notable events documented.

## 2024-02-29 NOTE — Discharge Instructions (Signed)
 Patient to follow up with Dr. Sherrilee in 2 weeks for wound check and pathology discussion.

## 2024-02-29 NOTE — Anesthesia Procedure Notes (Signed)
 Procedure Name: Intubation Date/Time: 02/29/2024 11:44 AM  Performed by: Para Jerelene CROME, CRNAPre-anesthesia Checklist: Patient identified, Emergency Drugs available, Suction available and Patient being monitored Patient Re-evaluated:Patient Re-evaluated prior to induction Oxygen Delivery Method: Circle system utilized Preoxygenation: Pre-oxygenation with 100% oxygen Induction Type: IV induction Ventilation: Mask ventilation without difficulty Laryngoscope Size: Glidescope and 4 Grade View: Grade II Tube type: Oral Tube size: 8.0 mm Number of attempts: 1 Airway Equipment and Method: Video-laryngoscopy and Rigid stylet Placement Confirmation: ETT inserted through vocal cords under direct vision, positive ETCO2 and CO2 detector Secured at: 25 (OETT secured 25 cm at lower lip.) cm Tube secured with: Tape Dental Injury: Teeth and Oropharynx as per pre-operative assessment  Comments: Atraumatic intubation x 1. Lips and teeth remain in preoperative condition.

## 2024-02-29 NOTE — H&P (Signed)
 HPI: [name] is a 38yo here for evaluation of a right testis mass. He noticed a right testis mass that has been growing in size fro 3 months. He has mild pain to palpation. No unexplained weight loss. No difficulty breathing. 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   SVT (supraventricular tachycardia)     Tobacco abuse            Surgical History:      Past Surgical History:  Procedure Laterality Date   APPENDECTOMY       BIOPSY   01/07/2022    Procedure: BIOPSY;  Surgeon: Melvin Angelia Sieving, MD;  [facility]: [facility];  Service: Gastroenterology;;   COLONOSCOPY WITH PROPOFOL  N/A 01/07/2022    Procedure: COLONOSCOPY WITH PROPOFOL ;  Surgeon: Melvin Angelia Sieving, MD;  Location: [facility];  Service: Gastroenterology;  Laterality: N/A;  1030 ASA 3   ESOPHAGOGASTRODUODENOSCOPY   01/07/2022    Procedure: ESOPHAGOGASTRODUODENOSCOPY (EGD);  Surgeon: Melvin Angelia, Sieving, MD;  Location: [facility];  Service: Gastroenterology;;   ESOPHAGOGASTRODUODENOSCOPY N/A 06/16/2023    Procedure: EGD (ESOPHAGOGASTRODUODENOSCOPY);  Surgeon: Melvin Angelia, Sieving, MD;  Location: [facility];  Service: Gastroenterology;  Laterality: N/A;  2:30PM;ASA 3   ESOPHAGOGASTRODUODENOSCOPY N/A 09/15/2023    Procedure: EGD (ESOPHAGOGASTRODUODENOSCOPY);  Surgeon: Melvin Angelia, Sieving, MD;  Location: [facility];  Service: Gastroenterology;  Laterality: N/A;  730am, asa 3   GIVENS CAPSULE STUDY N/A 02/05/2022    Procedure: GIVENS CAPSULE STUDY;  Surgeon: Melvin Angelia Sieving, MD;  Location: [facility];  Service: Gastroenterology;  Laterality: N/A;  8:30am          Home Medications:  Allergies as of 12/30/2023   No  Known Allergies         Medication List           Accurate as of December 30, 2023  1:54 PM. If you have any questions, ask your nurse or doctor.              Accu-Chek Guide Test test strip Generic drug: glucose blood 1 each by Other route 2 (two) times daily. Use as instructed    Accu-Chek Softclix Lancets lancets 3 (three) times daily.    albuterol 108 (90 Base) MCG/ACT inhaler Commonly known as: VENTOLIN HFA Inhale 1-2 puffs into the lungs every 6 (six) hours as needed for wheezing or shortness of breath.    cloNIDine  0.2 MG tablet Commonly known as: CATAPRES  Take 1 tablet (0.2 mg total) by mouth 2 (two) times daily.    diltiazem 300 MG 24 hr capsule Commonly known as: CARDIZEM CD Take 300 mg by mouth daily.    empagliflozin  10 MG Tabs tablet Commonly known as: Jardiance  Take 1 tablet (10 mg total) by mouth daily before breakfast.    FeroSul 325 (65 Fe) MG tablet Generic drug: ferrous sulfate Take 325 mg by mouth daily.    FLUoxetine 20 MG capsule Commonly known as: PROZAC Take 20 mg by mouth daily.    Fluticasone-Salmeterol 250-50 MCG/DOSE Aepb Commonly known as: ADVAIR Inhale 1 puff into the lungs daily.    hydrALAZINE  100 MG tablet  Commonly known as: APRESOLINE  Take 100 mg by mouth 2 (two) times daily.    metFORMIN  500 MG 24 hr tablet Commonly known as: GLUCOPHAGE -XR take 1 tablet (500 MILLIGRAM total) by mouth daily with breakfast.    metoprolol 200 MG 24 hr tablet Commonly known as: TOPROL-XL Take 200 mg by mouth daily.    NON FORMULARY Pt uses a cpap nightly    Olmesartan -amLODIPine -HCTZ 40-5-25 MG Tabs Take 1 tablet by mouth daily.    Ozempic  (2 MG/DOSE) 8 MG/3ML Sopn Generic drug: Semaglutide  (2 MG/DOSE) Inject 2 mg into the skin once a week. Needs appointment for refills    pantoprazole  40 MG tablet Commonly known as: PROTONIX  Take 1 tablet (40 mg total) by mouth 2 (two) times daily.    potassium chloride  10 MEQ tablet Commonly  known as: KLOR-CON  Take 10 mEq by mouth daily.    terbinafine  250 MG tablet Commonly known as: LAMISIL  Take 1 tablet (250 mg total) by mouth daily.    traZODone 100 MG tablet Commonly known as: DESYREL Take 200 mg by mouth at bedtime.    Vitamin D3 50 MCG (2000 UT) Tabs Take 2,000 Units by mouth daily.             Allergies:  Allergies  No Known Allergies     Family History:      Family History  Problem Relation Age of Onset   Heart failure Father     Hypertension Father     Diabetes Maternal Grandmother     Heart failure Paternal Grandmother          transplant          Social History:  reports that he has quit smoking. His smoking use included cigars. He has been exposed to tobacco smoke. He has quit using smokeless tobacco. He reports that he does not drink alcohol and does not use drugs.   ROS: All other review of systems were reviewed and are negative except what is noted above in HPI   Physical Exam: BP 109/69   Pulse 92   Constitutional:  Alert and oriented, No acute distress. HEENT: [place] AT, moist mucus membranes.  Trachea midline, no masses. Cardiovascular: No clubbing, cyanosis, or edema. Respiratory: Normal respiratory effort, no increased work of breathing. GI: Abdomen is soft, nontender, nondistended, no abdominal masses GU: No CVA tenderness. Circumcised phallus. No masses/lesions on penis, testis, 6cm right scrotal mass, mobile, nontedner to palpation.  Skin: No rashes, bruises or suspicious lesions. Neurologic: Grossly intact, no focal deficits, moving all 4 extremities. Psychiatric: Normal mood and affect.   Laboratory Data: Recent Labs       Lab Results  Component Value Date    WBC 9.3 09/10/2023    HGB 13.3 09/10/2023    HCT 40.9 09/10/2023    MCV 85.4 09/10/2023    PLT 211 09/10/2023        Recent Labs       Lab Results  Component Value Date    CREATININE 1.44 (H) 09/15/2023        Recent Labs  No results found for: PSA      Recent Labs  No results found for: TESTOSTERONE     Recent Labs       Lab Results  Component Value Date    HGBA1C 7.4 (A) 07/15/2023        Urinalysis Labs (Brief)  No results found for: COLORURINE, APPEARANCEUR, LABSPEC, PHURINE, GLUCOSEU, HGBUR, BILIRUBINUR, KETONESUR, PROTEINUR, UROBILINOGEN, NITRITE, LEUKOCYTESUR  Recent Labs  No results found for: LABMICR, WBCUA, RBCUA, LABEPIT, MUCUS, BACTERIA     Pertinent Imaging:   No results found for this or any previous visit.   No results found for this or any previous visit.   No results found for this or any previous visit.   No results found for this or any previous visit.   Results for orders placed during the hospital encounter of 03/01/21   US  RENAL   Narrative CLINICAL DATA:  Chronic kidney disease   EXAM: RENAL / URINARY TRACT ULTRASOUND COMPLETE   COMPARISON:  None.   FINDINGS: Right Kidney:   Renal measurements: 12.7 x 7.3 x 6.7 cm = volume: 326.4 mL. Echogenicity within normal limits. No mass or hydronephrosis visualized.   Left Kidney:   Renal measurements: 12.1 x 7.2 x 5.6 cm = volume: 254.5 mL. Echogenicity within normal limits. No mass or hydronephrosis visualized.   Bladder:   Appears normal for degree of bladder distention.   Other:   Liver appears echogenic suggesting steatosis   IMPRESSION: 1. Normal ultrasound appearance of the kidneys 2. Liver appears echogenic suggesting hepatic steatosis     Electronically Signed By: Luke Bun M.D. On: 03/01/2021 20:31   No results found for this or any previous visit.   No results found for this or any previous visit.   No results found for this or any previous visit.     Assessment & Plan:     1. Testicular mass (Primary) The risks/benefits/alternatives to right peritesticular mass excision was explained to the patient and he understands and wishes to proceed with surgery

## 2024-03-01 ENCOUNTER — Ambulatory Visit (INDEPENDENT_AMBULATORY_CARE_PROVIDER_SITE_OTHER): Admitting: Podiatry

## 2024-03-01 ENCOUNTER — Encounter (HOSPITAL_COMMUNITY): Payer: Self-pay | Admitting: Urology

## 2024-03-01 DIAGNOSIS — M79675 Pain in left toe(s): Secondary | ICD-10-CM | POA: Diagnosis not present

## 2024-03-01 DIAGNOSIS — M79674 Pain in right toe(s): Secondary | ICD-10-CM

## 2024-03-01 DIAGNOSIS — E1159 Type 2 diabetes mellitus with other circulatory complications: Secondary | ICD-10-CM

## 2024-03-01 DIAGNOSIS — B351 Tinea unguium: Secondary | ICD-10-CM

## 2024-03-01 LAB — SURGICAL PATHOLOGY

## 2024-03-01 NOTE — Anesthesia Postprocedure Evaluation (Signed)
"   Anesthesia Post Note  Patient: Sevyn Paredez  Procedure(s) Performed: EXPLORATION, SCROTUM (Right: Scrotum) EXCISION, SCROTUM (Right: Scrotum)  Patient location during evaluation: Phase II Anesthesia Type: MAC Level of consciousness: awake Pain management: pain level controlled Vital Signs Assessment: post-procedure vital signs reviewed and stable Respiratory status: spontaneous breathing and respiratory function stable Cardiovascular status: blood pressure returned to baseline and stable Postop Assessment: no headache and no apparent nausea or vomiting Anesthetic complications: no Comments: Late entry   No notable events documented.   Last Vitals:  Vitals:   02/29/24 1337 02/29/24 1355  BP: 138/89 139/80  Pulse: 77 76  Resp: (!) 22 20  Temp:  37.3 C  SpO2: 94% 98%    Last Pain:  Vitals:   02/29/24 1355  TempSrc: Oral  PainSc: 0-No pain                 Yvonna PARAS Jaeli Grubb      "

## 2024-03-01 NOTE — Progress Notes (Signed)
This patient returns to my office for at risk foot care.  This patient requires this care by a professional since this patient will be at risk due to having diabetes.   This patient is unable to cut nails himself since the patient cannot reach his nails.These nails are painful walking and wearing shoes.  This patient presents for at risk foot care today. His caregiver states he has developed a painful callus on the inside of left foot. He is brought to the office by caregiver from  [place].  General Appearance  Alert, conversant and in no acute stress.  Vascular  Dorsalis pedis and posterior tibial  pulses are not  palpable  Bilaterally due to swelling..  Capillary return is within normal limits  bilaterally. Temperature is within normal limits  bilaterally.  Neurologic  Senn-Weinstein monofilament wire test within normal limits  bilaterally. Muscle power within normal limits bilaterally.  Nails Thick disfigured discolored nails with subungual debris  from hallux to fifth toes bilaterally. No evidence of bacterial infection or drainage bilaterally.  Orthopedic  No limitations of motion  feet .  No crepitus or effusions noted.  No bony pathology or digital deformities noted.  Skin  normotropic skin with no porokeratosis noted bilaterally.  No signs of infections or ulcers noted.    Onychomycosis  Pain in right toes  Pain in left toes  Consent was obtained for treatment procedures.   Mechanical debridement of nails 1-5  bilaterally performed with a nail nipper.  Filed with dremel without incident.    Return office visit   3 months                 Told patient to return for periodic foot care and evaluation due to potential at risk complications.   Domitila Stetler [name]  

## 2024-03-07 ENCOUNTER — Telehealth: Payer: Self-pay

## 2024-03-07 ENCOUNTER — Encounter (HOSPITAL_COMMUNITY): Payer: Self-pay

## 2024-03-07 ENCOUNTER — Emergency Department (HOSPITAL_COMMUNITY)
Admission: EM | Admit: 2024-03-07 | Discharge: 2024-03-07 | Disposition: A | Discharge status: Home / Self Care | Attending: Emergency Medicine | Admitting: Emergency Medicine

## 2024-03-07 ENCOUNTER — Other Ambulatory Visit: Payer: Self-pay

## 2024-03-07 DIAGNOSIS — X58XXXA Exposure to other specified factors, initial encounter: Secondary | ICD-10-CM | POA: Insufficient documentation

## 2024-03-07 DIAGNOSIS — S3130XA Unspecified open wound of scrotum and testes, initial encounter: Secondary | ICD-10-CM | POA: Diagnosis present

## 2024-03-07 MED ORDER — BACITRACIN ZINC 500 UNIT/GM EX OINT: 1.0000 | TOPICAL_OINTMENT | Freq: Two times a day (BID) | CUTANEOUS | 0 refills | Status: DC

## 2024-03-07 MED ORDER — DOXYCYCLINE HYCLATE 100 MG PO CAPS: 100.0000 mg | ORAL_CAPSULE | Freq: Two times a day (BID) | ORAL | 0 refills | Status: AC

## 2024-03-07 NOTE — ED Provider Notes (Signed)
 " [place] EMERGENCY DEPARTMENT AT [facility] Provider Note   CSN: [id number] Arrival date & time: 03/07/24  9290     Patient presents with: Wound Dehiscence   Melvin Ray is a 39 y.o. male.  He is brought in by ambulance from home after noticing his scrotal wound had opened.  He had a mass excised about a week ago by Dr. Sherrilee urology.  He continues to have 7 out of 10 pain in that area.  He said he was cleaning his scrotum with a warm wet rag this morning when he noticed that there was an opening where the stitches were.  There was some drainage.  No fevers chills nausea vomiting.  No urinary symptoms.  Pain is no worse today than it has been over the last few days.   The history is provided by the patient.  Wound Check This is a new problem. The problem has not changed since onset.Pertinent negatives include no chest pain, no abdominal pain, no headaches and no shortness of breath. Nothing aggravates the symptoms. Nothing relieves the symptoms. He has tried nothing for the symptoms. The treatment provided no relief.       Prior to Admission medications  Medication Sig Start Date End Date Taking? Authorizing Provider  Accu-Chek Softclix Lancets lancets 3 (three) times daily. 10/17/19   [provider]  albuterol (VENTOLIN HFA) 108 (90 Base) MCG/ACT inhaler Inhale 1-2 puffs into the lungs every 6 (six) hours as needed for wheezing or shortness of breath.    [provider]  Cholecalciferol  (VITAMIN D3) 50 MCG (2000 UT) TABS Take 2,000 Units by mouth daily.    [provider]  cloNIDine  (CATAPRES ) 0.2 MG tablet Take 1 tablet (0.2 mg total) by mouth 2 (two) times daily. 12/24/23   Ray, Melvin S, NP  diltiazem (CARDIZEM CD) 300 MG 24 hr capsule Take 300 mg by mouth daily.    [provider]  empagliflozin  (JARDIANCE ) 25 MG TABS tablet Take 1 tablet (25 mg total) by mouth daily before breakfast. 01/18/24   [name], [name], MD  FEROSUL  325 (65 Fe) MG tablet Take 325 mg by mouth daily. 11/19/21   [provider]  FLUoxetine (PROZAC) 20 MG capsule Take 20 mg by mouth daily. 10/22/21   [provider]  Fluticasone-Salmeterol (ADVAIR) 250-50 MCG/DOSE AEPB Inhale 1 puff into the lungs daily.    [provider]  glucose blood (ACCU-CHEK GUIDE TEST) test strip 1 each by Other route 2 (two) times daily. Use as instructed 09/15/23   [name], [name], MD  hydrALAZINE  (APRESOLINE ) 100 MG tablet Take 100 mg by mouth 2 (two) times daily. 10/22/21   [provider]  metFORMIN  (GLUCOPHAGE -XR) 500 MG 24 hr tablet take 1 tablet (500 MILLIGRAM total) by mouth daily with breakfast. 12/28/23   [name], [name], MD  metoprolol (TOPROL-XL) 200 MG 24 hr tablet Take 200 mg by mouth daily. 10/13/19   [provider]  NON FORMULARY Pt uses a cpap nightly    [provider]  Olmesartan -amLODIPine -HCTZ 40-5-25 MG TABS Take 1 tablet by mouth daily. 12/24/23   Ray, Melvin S, NP  oxyCODONE -acetaminophen  (PERCOCET) 5-325 MG tablet Take 1 tablet by mouth every 4 (four) hours as needed for severe pain (pain score 7-10). 02/29/24 02/28/25  [name], MD  pantoprazole  (PROTONIX ) 40 MG tablet Take 1 tablet (40 mg total) by mouth 2 (two) times daily. 09/15/23   Melvin Angelia Sieving, MD  potassium chloride  (  KLOR-CON ) 10 MEQ tablet Take 10 mEq by mouth daily. 10/22/21   [provider]  Semaglutide , 2 MG/DOSE, (OZEMPIC , 2 MG/DOSE,) 8 MG/3ML SOPN Inject 2 mg into the skin once a week. Needs appointment for refills 10/07/23   [name], MD  terbinafine  (LAMISIL ) 250 MG tablet Take 1 tablet (250 mg total) by mouth daily. 07/09/23   Melvin Ray, [name]  traZODone (DESYREL) 100 MG tablet Take 200 mg by mouth at bedtime.     [provider]    Allergies: Patient has no known allergies.    Review of Systems  Constitutional:  Negative for fever.  Respiratory:  Negative for  shortness of breath.   Cardiovascular:  Negative for chest pain.  Gastrointestinal:  Negative for abdominal pain.  Genitourinary:  Negative for dysuria.  Skin:  Positive for wound.  Neurological:  Negative for headaches.    Updated Vital Signs BP 123/65   Pulse 83   Temp (!) 97.5 F (36.4 C) (Oral)   Resp 18   Ht 6' (1.829 m)   Wt (!) 173 kg   SpO2 96%   BMI 51.73 kg/m   Physical Exam Vitals and nursing note reviewed.  Constitutional:      Appearance: Normal appearance. He is well-developed.  HENT:     Head: Normocephalic and atraumatic.  Eyes:     Conjunctiva/sclera: Conjunctivae normal.  Cardiovascular:     Rate and Rhythm: Normal rate and regular rhythm.  Pulmonary:     Effort: Pulmonary effort is normal. No respiratory distress.  Abdominal:     Tenderness: There is no abdominal tenderness. There is no guarding or rebound.  Genitourinary:    Comments: Right hemiscrotum has surgical incision with some disrupted sutures.  Approximately 3 cm opening.  No significant drainage or induration. Musculoskeletal:     Cervical back: Neck supple.  Skin:    General: Skin is warm and dry.  Neurological:     Mental Status: He is alert.     GCS: GCS eye subscore is 4. GCS verbal subscore is 5. GCS motor subscore is 6.     (all labs ordered are listed, but only abnormal results are displayed) Labs Reviewed - No data to display  EKG: None  Radiology: No results found.   Procedures   Medications Ordered in the ED - No data to display  Clinical Course as of 03/07/24 1646  Mon Mar 07, 2024  0825 Reviewed case with urology Dr. Sherrilee.  He said to put the patient on doxycycline , have him apply bacitracin  to the wound.  Follow-up in the office as scheduled. [MB]    Clinical Course User Index [MB] Melvin Ozell BROCKS, MD                                 Medical Decision Making Risk OTC drugs. Prescription drug management.   This patient complains of scrotal wound  opening; this involves an extensive number of treatment Options and is a complaint that carries with it a high risk of complications and morbidity. The differential includes wound dehiscence, infection, cellulitis  Previous records obtained and reviewed recent operative notes I consulted neurology Dr. Sherrilee and discussed lab and imaging findings and discussed disposition.  Social determinants considered, no significant barriers Critical Interventions: None  After the interventions stated above, I reevaluated the patient and found patient to be well-appearing in no distress Admission and further testing considered,  he will be started on oral antibiotics and recommended close follow-up with his outpatient treatment team.  Return instructions discussed.      Final diagnoses:  Open wound of scrotum, initial encounter    ED Discharge Orders          Ordered    bacitracin  ointment  2 times daily        03/07/24 0826    doxycycline  (VIBRAMYCIN ) 100 MG capsule  2 times daily        03/07/24 0826               Melvin Ozell BROCKS, MD 03/07/24 1647  "

## 2024-03-07 NOTE — ED Triage Notes (Signed)
 Pt bib [facility] with complaints of a cyst that was removed on his testicle has opened back up. Surgery was on January 5th.

## 2024-03-07 NOTE — Discharge Instructions (Signed)
 Keep area clean with soap and water .  Apply bacitracin  to wound twice a day.  Finish antibiotics.  Follow-up with Dr. Sherrilee.  Return if any worsening or concerning symptoms

## 2024-03-07 NOTE — Telephone Encounter (Signed)
 Melvin Ray, pt social worker wanted to scheduled appointment for pt. Mr. Ray is given pt appointment time and date. Voiced understanding

## 2024-03-14 ENCOUNTER — Encounter (HOSPITAL_BASED_OUTPATIENT_CLINIC_OR_DEPARTMENT_OTHER): Admitting: Family

## 2024-03-14 NOTE — Progress Notes (Unsigned)
 "  [facility] Assessment:    Date:  03/14/2024   ID:  Melvin Ray, DOB Jan 29, 1986, MRN [id number]  PCP:  Vicky Berg, MD  Cardiologist:  Alvan Carrier, MD  Nephrologist: Rachele, MD  Referring MD: Vicky Berg, MD   CC: Hypertension  History of Present Illness:    Melvin Ray is a 39 y.o. male with a hx of DM2, morbid obesity, HTN, OSA, HLD here to follow up in the [facility].   At [facility] visit 03/24/22 with Dr. Raford, Amlodipine  added to regimen of chlorthalidone , diltiazem, clonidine , hydralazine , losartan, metoprolol.   Evaluated spring 03/2022 by PharmD team and Ozempic  initiated and uptitrated.  2011 normal adrenals by imaging. 04/2022 no evidence of hyperaldosteronism. 06/2023 normal TSH.  Last seen 12/24/23. Bp at home and in clinic well controlled. Occasional positional lightheadedness. Amlodipine , chlorthalidone , losartan stopped and he was instead started on Amlodipine -Olmesartan -hydrochlorothiazide  4-40-25mg  daily.  Clonidine  reduced to 0.2 mg twice daily due to lightheadedness. Renal artery duplex 01/08/24 with no renal artery stenosis.   On 02/29/24 had exploration of scrotum with removal of mass. Subsequent ED visit 03/07/24 when wound opened. He was given Doxycycline  PO and bacitracin  to the wound.Has follow up with urology 03/16/24.    ?RDN  Presents today for follow up with his case Production Assistant, Radio.   ***He is doing well from a cardiac standpoint. He is checking BP at home: once weekly with last known BP 110/70. Does not bring a log with him today. He notes occasional positional lightheadedness. He denies chest pain, shortness of breath, edema, claudication. He reports compliance with BiPAP and is sleeping well. In warmer weather, he had been walking daily in his neighborhood for 1 hour, with occasional rest breaks. He has not been able to do so recently due to weather and does not have transportation to a  nearby fitness facility. He has been working on portion control, on Ozempic . Notes GI upset the day after each dose without acute concerns.   Previous antihypertensives:   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   SVT (supraventricular tachycardia)    Tobacco abuse     Past Surgical History:  Procedure Laterality Date   APPENDECTOMY     BIOPSY  01/07/2022   Procedure: BIOPSY;  Surgeon: [name], MD;  [facility];  Service: Gastroenterology;;   COLONOSCOPY WITH PROPOFOL  N/A 01/07/2022   Procedure: COLONOSCOPY WITH PROPOFOL ;  Surgeon: [name], MD;  [facility];  Service: Gastroenterology;  Laterality: N/A;  1030 ASA 3   ESOPHAGOGASTRODUODENOSCOPY  01/07/2022   Procedure: ESOPHAGOGASTRODUODENOSCOPY (EGD);  Surgeon: [name], Sieving, MD;  [facility];  Service: Gastroenterology;;   ESOPHAGOGASTRODUODENOSCOPY N/A 06/16/2023   Procedure: EGD (ESOPHAGOGASTRODUODENOSCOPY);  Surgeon: [name], Sieving, MD;  [facility];  Service: Gastroenterology;  Laterality: N/A;  2:30PM;ASA 3   ESOPHAGOGASTRODUODENOSCOPY N/A 09/15/2023   Procedure: EGD (ESOPHAGOGASTRODUODENOSCOPY);  Surgeon: [name], Sieving, MD;  [facility];  Service: Gastroenterology;  Laterality: N/A;  730am, asa 3   GIVENS CAPSULE STUDY N/A 02/05/2022   Procedure: GIVENS CAPSULE STUDY;  Surgeon: [name], MD;  [facility];  Service: Gastroenterology;  Laterality: N/A;  8:30am  SCROTAL EXPLORATION Right 02/29/2024   Procedure: EXPLORATION, SCROTUM;  Surgeon: [name], MD;  Location: [facility];  Service: Urology;  Laterality: Right;   SCROTECTOMY Right 02/29/2024    Procedure: EXCISION, SCROTUM;  Surgeon: [name], MD;  Location: [facility];  Service: Urology;  Laterality: Right;    Current Medications: No outpatient medications have been marked as taking for the 03/14/24 encounter (Appointment) with [name], NP.     Allergies:   Patient has no known allergies.   Social History   Socioeconomic History   Marital status: Single    Spouse name: Not on file   Number of children: Not on file   Years of education: Not on file   Highest education level: Not on file  Occupational History   Not on file  Tobacco Use   Smoking status: Former    Types: Cigars    Passive exposure: Current   Smokeless tobacco: Former  Building Services Engineer status: Former  Substance and Sexual Activity   Alcohol use: Never   Drug use: Never   Sexual activity: Not on file  Other Topics Concern   Not on file  Social History Narrative   Not on file   Social Drivers of Health   Tobacco Use: Medium Risk (03/07/2024)   Patient History    Smoking Tobacco Use: Former    Smokeless Tobacco Use: Former    Passive Exposure: Current  Physicist, Medical Strain: Medium Risk (03/24/2022)   Overall Financial Resource Strain (CARDIA)    Difficulty of Paying Living Expenses: Somewhat hard  Food Insecurity: No Food Insecurity (03/24/2022)   Hunger Vital Sign    Worried About Running Out of Food in the Last Year: Never true    Ran Out of Food in the Last Year: Never true  Transportation Needs: No Transportation Needs (03/24/2022)   PRAPARE - Administrator, Civil Service (Medical): No    Lack of Transportation (Non-Medical): No  Physical Activity: Not on file  Stress: Not on file  Social Connections: Not on file  Depression (EYV7-0): Not on file  Alcohol Screen: Not on file  Housing: Low Risk (03/24/2022)   Housing    Last Housing Risk Score: 0  Utilities: Not At Risk (03/24/2022)   AHC Utilities    Threatened with loss of utilities: No  Health  Literacy: Not on file     Family History: The patient's family history includes Diabetes in his maternal grandmother; Heart failure in his father and paternal grandmother; Hypertension in his father.  ROS:   Please see the history of present illness.    All other systems reviewed and are negative.  EKGs/Labs/Other Studies Reviewed:         Recent Labs: 07/08/2023: TSH 2.790 09/10/2023: Hemoglobin 13.3; Platelets 211 09/11/2023: Magnesium  1.5 01/15/2024: ALT 22; BUN 10; Creatinine, Ser 1.14; Potassium 3.6; Sodium 145   Recent Lipid Panel    Component Value Date/Time   CHOL 176 01/15/2024 1423   TRIG 192 (H) 01/15/2024 1423   HDL 37 (L) 01/15/2024 1423   CHOLHDL 4.8 01/15/2024 1423   LDLCALC 105 (H) 01/15/2024 1423    Physical Exam:   VS:  There were no vitals taken for this visit. , BMI There is no height or weight on file to calculate BMI. GENERAL:  Well appearing HEENT: Pupils equal round and reactive, fundi not visualized, oral mucosa unremarkable NECK:  No jugular venous distention, waveform within normal limits, carotid upstroke  brisk and symmetric, no bruits, no thyromegaly LYMPHATICS:  No cervical adenopathy LUNGS:  Clear to auscultation bilaterally HEART:  RRR.  PMI not displaced or sustained,S1 and S2 within normal limits, no S3, no S4, no clicks, no rubs, no murmurs EXT:  2+ pulses throughout, no edema, no cyanosis no clubbing SKIN:  Acanthosis nigricans of posterior neck; No rashes, no nodules NEURO:  Cranial nerves II through XII grossly intact, motor grossly intact throughout PSYCH:  Cognitively intact, oriented to person place and time  Wt Readings from Last 3 Encounters:  03/07/24 (!) 381 lb 6.3 oz (173 kg)  02/29/24 (!) 382 lb 15 oz (173.7 kg)  02/22/24 (!) 382 lb 15 oz (173.7 kg)   BP Readings from Last 3 Encounters:  03/07/24 123/65  02/29/24 139/80  01/18/24 126/88     ASSESSMENT/PLAN:    HTN, goal <130/80- BP at goal by in office; 12/18/23  labs: BUN, creat 1.22, Na 139, K+ 3.9; without symptoms of headaches, vision changes; commended on weight loss and exercise efforts for hypertension beneft To simplify medication regimen/reduce pill burden with stable BP readings:  Discontinue amlodipine , chlorthalidone , losartan Start olmesartan -amlodipine -hydrochlorothiazide  40-5-25mg  daily Reduce Clonidine  to 0.2mg  BID due to lightheadedness likely orthostasis Continue diltiazem 300mg  daily, toprol 200mg  daily, hydralazine  100mg  twice daily BMET in 1-2 weeks Will have him check BP at home 3x/weekly  Plan for renal artery duplex for further secondary workup. If no RAS, could consider renal denervation***  OSA on BiPAP - follows with pulmonary; reports compliance with BiPAP;  Screening for Secondary Hypertension:     12/24/2023    4:43 PM  Causes  Renovascular HTN Screened     - Comments 12/24/23 renal duplex ordered  Sleep Apnea Screened     - Comments on BIPAP  Thyroid Disease Screened  Hyperaldosteronism Screened     - Comments screened, negative  Pheochromocytoma Screened     - Comments screened, negative  Coarctation of the Aorta N/A     - Comments symmetric BP  Compliance Screened     - Comments uses adherence packaging from [facility] pharmacy    Relevant Labs/Studies:    Latest Ref Rng & Units 01/15/2024    2:23 PM 09/15/2023   11:10 AM 09/11/2023    3:31 PM  Basic Labs  Sodium 134 - 144 mmol/L 145  136  133   Potassium 3.5 - 5.2 mmol/L 3.6  3.4  2.9   Creatinine 0.76 - 1.27 mg/dL 8.85  8.55  8.60        Latest Ref Rng & Units 07/08/2023   10:23 AM 07/23/2021   12:00 AM  Thyroid   TSH 0.450 - 4.500 uIU/mL 2.790  4.81         This result is from an external source.       Latest Ref Rng & Units 05/08/2022    3:41 PM  Renin/Aldosterone   Aldosterone 0.0 - 30.0 ng/dL 5.0   Aldos/Renin Ratio 0.0 - 30.0 16.7              01/08/2024   12:34 PM  Renovascular   Renal Artery US  Completed Yes       Disposition:    FU with MD/APP/PharmD in *** months    Medication Adjustments/Labs and Tests Ordered: Current medicines are reviewed at length with the patient today.  Concerns regarding medicines are outlined above.  No orders of the defined types were placed in this encounter.  No orders of the defined types were  placed in this encounter.    Signed, Reche GORMAN Finder, NP  03/14/2024 12:58 PM    [facility] "

## 2024-03-16 ENCOUNTER — Ambulatory Visit: Admitting: Urology

## 2024-03-16 VITALS — BP 127/86 | HR 79

## 2024-03-16 DIAGNOSIS — N5089 Other specified disorders of the male genital organs: Secondary | ICD-10-CM

## 2024-03-16 DIAGNOSIS — L720 Epidermal cyst: Secondary | ICD-10-CM

## 2024-03-16 MED ORDER — BACITRACIN ZINC 500 UNIT/GM EX OINT: 1.0000 | TOPICAL_OINTMENT | Freq: Two times a day (BID) | CUTANEOUS | 0 refills | Status: AC

## 2024-03-16 NOTE — Progress Notes (Unsigned)
 "  03/16/2024 11:28 AM   Melvin Ray 03-07-1985 [id number]  Referring provider: Vicky Berg, MD [address] [place],  [place] [address]  No chief complaint on file.   HPI: Mr Belluomini is a 38yo here for followup after paratesticular mass excision. Pathology epidermoid cyst   PMH: 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   SVT (supraventricular tachycardia)    Tobacco abuse     Surgical History: Past Surgical History:  Procedure Laterality Date   APPENDECTOMY     BIOPSY  01/07/2022   Procedure: BIOPSY;  Surgeon: [name], MD;  [facility];  Service: Gastroenterology;;   COLONOSCOPY WITH PROPOFOL  N/A 01/07/2022   Procedure: COLONOSCOPY WITH PROPOFOL ;  Surgeon: [name], MD;  [facility];  Service: Gastroenterology;  Laterality: N/A;  1030 ASA 3   ESOPHAGOGASTRODUODENOSCOPY  01/07/2022   Procedure: ESOPHAGOGASTRODUODENOSCOPY (EGD);  Surgeon: [name], Sieving, MD;  [facility];  Service: Gastroenterology;;   ESOPHAGOGASTRODUODENOSCOPY N/A 06/16/2023   Procedure: EGD (ESOPHAGOGASTRODUODENOSCOPY);  Surgeon: [name], Sieving, MD;  [facility];  Service: Gastroenterology;  Laterality: N/A;  2:30PM;ASA 3   ESOPHAGOGASTRODUODENOSCOPY N/A 09/15/2023   Procedure: EGD (ESOPHAGOGASTRODUODENOSCOPY);  Surgeon: [name], Sieving, MD;  [facility];  Service: Gastroenterology;  Laterality: N/A;  730am, asa 3   GIVENS CAPSULE STUDY N/A 02/05/2022   Procedure: GIVENS CAPSULE STUDY;  Surgeon: [name], MD;  [facility];  Service: Gastroenterology;  Laterality: N/A;  8:30am   SCROTAL EXPLORATION Right 02/29/2024   Procedure: EXPLORATION,  SCROTUM;  Surgeon: [name], MD;  Location: [facility];  Service: Urology;  Laterality: Right;   SCROTECTOMY Right 02/29/2024   Procedure: EXCISION, SCROTUM;  Surgeon: [name], MD;  Location: [facility];  Service: Urology;  Laterality: Right;    Home Medications:  Allergies as of 03/16/2024   No Known Allergies      Medication List        Accurate as of March 16, 2024 11:28 AM. If you have any questions, ask your nurse or doctor.          Accu-Chek Guide Test test strip Generic drug: glucose blood 1 each by Other route 2 (two) times daily. Use as instructed   Accu-Chek Softclix Lancets lancets 3 (three) times daily.   albuterol 108 (90 Base) MCG/ACT inhaler Commonly known as: VENTOLIN HFA Inhale 1-2 puffs into the lungs every 6 (six) hours as needed for wheezing or shortness of breath.   bacitracin  ointment Apply 1 Application topically 2 (two) times daily.   cloNIDine  0.2 MG tablet Commonly known as: CATAPRES  Take 1 tablet (0.2 mg total) by mouth 2 (two) times daily.   diltiazem 300 MG 24 hr capsule Commonly known as: CARDIZEM CD Take 300 mg by mouth daily.   doxycycline  100 MG capsule Commonly known as: VIBRAMYCIN  Take 1 capsule (100 mg total) by mouth 2 (two) times daily.   empagliflozin  25 MG Tabs tablet Commonly known as: Jardiance  Take 1 tablet (25 mg total) by mouth daily before breakfast.   FeroSul 325 (65 Fe) MG tablet Generic drug: ferrous sulfate Take 325 mg by mouth daily.   FLUoxetine 20 MG capsule Commonly  known as: PROZAC Take 20 mg by mouth daily.   Fluticasone-Salmeterol 250-50 MCG/DOSE Aepb Commonly known as: ADVAIR Inhale 1 puff into the lungs daily.   hydrALAZINE  100 MG tablet Commonly known as: APRESOLINE  Take 100 mg by mouth 2 (two) times daily.   metFORMIN  500 MG 24 hr tablet Commonly known as: GLUCOPHAGE -XR take 1 tablet (500 MILLIGRAM total) by mouth daily with breakfast.   metoprolol 200 MG 24 hr  tablet Commonly known as: TOPROL-XL Take 200 mg by mouth daily.   NON FORMULARY Pt uses a cpap nightly   Olmesartan -amLODIPine -HCTZ 40-5-25 MG Tabs Take 1 tablet by mouth daily.   oxyCODONE -acetaminophen  5-325 MG tablet Commonly known as: Percocet Take 1 tablet by mouth every 4 (four) hours as needed for severe pain (pain score 7-10).   Ozempic  (2 MG/DOSE) 8 MG/3ML Sopn Generic drug: Semaglutide  (2 MG/DOSE) Inject 2 mg into the skin once a week. Needs appointment for refills   pantoprazole  40 MG tablet Commonly known as: PROTONIX  Take 1 tablet (40 mg total) by mouth 2 (two) times daily.   potassium chloride  10 MEQ tablet Commonly known as: KLOR-CON  Take 10 mEq by mouth daily.   terbinafine  250 MG tablet Commonly known as: LAMISIL  Take 1 tablet (250 mg total) by mouth daily.   traZODone 100 MG tablet Commonly known as: DESYREL Take 200 mg by mouth at bedtime.   Vitamin D3 50 MCG (2000 UT) Tabs Take 2,000 Units by mouth daily.        Allergies: Allergies[1]  Family History: Family History  Problem Relation Age of Onset   Heart failure Father    Hypertension Father    Diabetes Maternal Grandmother    Heart failure Paternal Grandmother        transplant    Social History:  reports that he has quit smoking. His smoking use included cigars. He has been exposed to tobacco smoke. He has quit using smokeless tobacco. He reports that he does not drink alcohol and does not use drugs.  ROS: All other review of systems were reviewed and are negative except what is noted above in HPI  Physical Exam: BP 127/86   Pulse 79   Constitutional:  Alert and oriented, No acute distress. HEENT: [place] AT, moist mucus membranes.  Trachea midline, no masses. Cardiovascular: No clubbing, cyanosis, or edema. Respiratory: Normal respiratory effort, no increased work of breathing. GI: Abdomen is soft, nontender, nondistended, no abdominal masses GU: No CVA tenderness.  Lymph: No  cervical or inguinal lymphadenopathy. Skin: No rashes, bruises or suspicious lesions. Neurologic: Grossly intact, no focal deficits, moving all 4 extremities. Psychiatric: Normal mood and affect.  Laboratory Data: Lab Results  Component Value Date   WBC 9.3 09/10/2023   HGB 13.3 09/10/2023   HCT 40.9 09/10/2023   MCV 85.4 09/10/2023   PLT 211 09/10/2023    Lab Results  Component Value Date   CREATININE 1.14 01/15/2024    No results found for: PSA  No results found for: TESTOSTERONE  Lab Results  Component Value Date   HGBA1C 7.3 01/18/2024    Urinalysis No results found for: COLORURINE, APPEARANCEUR, LABSPEC, PHURINE, GLUCOSEU, HGBUR, BILIRUBINUR, KETONESUR, PROTEINUR, UROBILINOGEN, NITRITE, LEUKOCYTESUR  No results found for: LABMICR, WBCUA, RBCUA, LABEPIT, MUCUS, BACTERIA  Pertinent Imaging: *** No results found for this or any previous visit.  No results found for this or any previous visit.  No results found for this or any previous visit.  No results found for this or any previous visit.  Results for orders placed during the hospital encounter of 03/01/21  US  RENAL  Narrative CLINICAL DATA:  Chronic kidney disease  EXAM: RENAL / URINARY TRACT ULTRASOUND COMPLETE  COMPARISON:  None.  FINDINGS: Right Kidney:  Renal measurements: 12.7 x 7.3 x 6.7 cm = volume: 326.4 mL. Echogenicity within normal limits. No mass or hydronephrosis visualized.  Left Kidney:  Renal measurements: 12.1 x 7.2 x 5.6 cm = volume: 254.5 mL. Echogenicity within normal limits. No mass or hydronephrosis visualized.  Bladder:  Appears normal for degree of bladder distention.  Other:  Liver appears echogenic suggesting steatosis  IMPRESSION: 1. Normal ultrasound appearance of the kidneys 2. Liver appears echogenic suggesting hepatic steatosis   Electronically Signed By: Luke Bun M.D. On: 03/01/2021 20:31  No results  found for this or any previous visit.  No results found for this or any previous visit.  No results found for this or any previous visit.   Assessment & Plan:    1. Epidermoid cyst, scrotal wound Bacitracin  BID to scrotal wound    No follow-ups on file.  Belvie Clara, MD  [facility]      [1] No Known Allergies  "

## 2024-03-29 ENCOUNTER — Other Ambulatory Visit: Payer: Self-pay | Admitting: "Endocrinology

## 2024-04-27 ENCOUNTER — Encounter: Admitting: Urology

## 2024-05-26 ENCOUNTER — Ambulatory Visit: Admitting: "Endocrinology

## 2024-05-31 ENCOUNTER — Ambulatory Visit: Admitting: Podiatry
